# Patient Record
Sex: Female | Born: 1945 | Race: White | Hispanic: No | Marital: Single | Smoking: Never smoker
Health system: Southern US, Community
[De-identification: ages and names within clinical notes are randomized; demographics above are authoritative.]

## PROBLEM LIST (undated history)

## (undated) DIAGNOSIS — Z87440 Personal history of urinary (tract) infections: Secondary | ICD-10-CM

## (undated) DIAGNOSIS — E109 Type 1 diabetes mellitus without complications: Secondary | ICD-10-CM

---

## 1995-04-19 DIAGNOSIS — C50919 Malignant neoplasm of unspecified site of unspecified female breast: Secondary | ICD-10-CM

## 1995-04-19 HISTORY — DX: Malignant neoplasm of unspecified site of unspecified female breast: C50.919

## 2020-09-28 ENCOUNTER — Encounter: Payer: Self-pay | Admitting: Endocrinology

## 2020-09-29 ENCOUNTER — Other Ambulatory Visit: Payer: Self-pay

## 2020-09-29 ENCOUNTER — Emergency Department (HOSPITAL_COMMUNITY)
Admission: EM | Admit: 2020-09-29 | Discharge: 2020-09-29 | Disposition: A | Discharge status: Skilled Nursing Facility | Payer: Medicare HMO | Attending: Emergency Medicine | Admitting: Emergency Medicine

## 2020-09-29 DIAGNOSIS — E11649 Type 2 diabetes mellitus with hypoglycemia without coma: Secondary | ICD-10-CM | POA: Insufficient documentation

## 2020-09-29 DIAGNOSIS — I2510 Atherosclerotic heart disease of native coronary artery without angina pectoris: Secondary | ICD-10-CM | POA: Diagnosis not present

## 2020-09-29 DIAGNOSIS — R600 Localized edema: Secondary | ICD-10-CM | POA: Insufficient documentation

## 2020-09-29 DIAGNOSIS — E162 Hypoglycemia, unspecified: Secondary | ICD-10-CM | POA: Diagnosis present

## 2020-09-29 DIAGNOSIS — Z794 Long term (current) use of insulin: Secondary | ICD-10-CM | POA: Diagnosis not present

## 2020-09-29 DIAGNOSIS — I10 Essential (primary) hypertension: Secondary | ICD-10-CM | POA: Insufficient documentation

## 2020-09-29 LAB — CBC WITH DIFFERENTIAL/PLATELET
Abs Immature Granulocytes: 0.06 10*3/uL (ref 0.00–0.07)
Basophils Absolute: 0.1 10*3/uL (ref 0.0–0.1)
Basophils Relative: 1 %
Eosinophils Absolute: 0 10*3/uL (ref 0.0–0.5)
Eosinophils Relative: 0 %
HCT: 37.8 % (ref 36.0–46.0)
Hemoglobin: 11.8 g/dL — ABNORMAL LOW (ref 12.0–15.0)
Immature Granulocytes: 1 %
Lymphocytes Relative: 6 %
Lymphs Abs: 0.7 10*3/uL (ref 0.7–4.0)
MCH: 29.4 pg (ref 26.0–34.0)
MCHC: 31.2 g/dL (ref 30.0–36.0)
MCV: 94.3 fL (ref 80.0–100.0)
Monocytes Absolute: 0.8 10*3/uL (ref 0.1–1.0)
Monocytes Relative: 8 %
Neutro Abs: 9.3 10*3/uL — ABNORMAL HIGH (ref 1.7–7.7)
Neutrophils Relative %: 84 %
Platelets: 306 10*3/uL (ref 150–400)
RBC: 4.01 MIL/uL (ref 3.87–5.11)
RDW: 12 % (ref 11.5–15.5)
WBC: 10.9 10*3/uL — ABNORMAL HIGH (ref 4.0–10.5)
nRBC: 0 % (ref 0.0–0.2)

## 2020-09-29 LAB — COMPREHENSIVE METABOLIC PANEL
ALT: 12 U/L (ref 0–44)
AST: 19 U/L (ref 15–41)
Albumin: 3 g/dL — ABNORMAL LOW (ref 3.5–5.0)
Alkaline Phosphatase: 83 U/L (ref 38–126)
Anion gap: 10 (ref 5–15)
BUN: 24 mg/dL — ABNORMAL HIGH (ref 8–23)
CO2: 25 mmol/L (ref 22–32)
Calcium: 8.8 mg/dL — ABNORMAL LOW (ref 8.9–10.3)
Chloride: 96 mmol/L — ABNORMAL LOW (ref 98–111)
Creatinine, Ser: 1.15 mg/dL — ABNORMAL HIGH (ref 0.44–1.00)
GFR, Estimated: 50 mL/min — ABNORMAL LOW (ref >60)
Glucose, Bld: 601 mg/dL (ref 70–99)
Potassium: 3.9 mmol/L (ref 3.5–5.1)
Sodium: 131 mmol/L — ABNORMAL LOW (ref 135–145)
Total Bilirubin: 0.6 mg/dL (ref 0.3–1.2)
Total Protein: 6 g/dL — ABNORMAL LOW (ref 6.5–8.1)

## 2020-09-29 LAB — CK: Total CK: 156 U/L (ref 38–234)

## 2020-09-29 LAB — BRAIN NATRIURETIC PEPTIDE: B Natriuretic Peptide: 100.8 pg/mL — ABNORMAL HIGH (ref 0.0–100.0)

## 2020-09-29 LAB — CBG MONITORING, ED
Glucose-Capillary: 215 mg/dL — ABNORMAL HIGH (ref 70–99)
Glucose-Capillary: 237 mg/dL — ABNORMAL HIGH (ref 70–99)
Glucose-Capillary: 262 mg/dL — ABNORMAL HIGH (ref 70–99)

## 2020-09-29 MED ORDER — INSULIN ASPART 100 UNIT/ML IJ SOLN: 4.0000 [IU] | INTRAMUSCULAR | Status: DC

## 2020-09-29 MED ORDER — SODIUM CHLORIDE 0.9 % IV BOLUS
500.0000 mL | Freq: Once | INTRAVENOUS | Status: AC
  Administered 2020-09-29: 500 mL via INTRAVENOUS

## 2020-09-29 NOTE — Discharge Instructions (Addendum)
Please follow-up with your endocrinologist as soon as you are able to.  If no sooner you may follow-up on your July 7 appointment.  I would certainly call earlier to see if there is any cancellations.  In the meantime please use between 4-6 U of insulin per meal. Be cautious. Check your blood sugar and err on the side of high rather than low blood sugar.   You may was return to the ER for any new or concerning symptoms.

## 2020-09-29 NOTE — ED Triage Notes (Signed)
Pt from [facility], BIB [facility] after having an issue of hypoglycemia. Pt was found by staff to have a CBG of 50 and was cool, clammy, and only responsive to pain. EMS administered 15 g of dextrose along with carbs and CBG recheck was 200. Pt now Aox4. This is the 3rd time this has happened in a month. All other VSS.

## 2020-09-29 NOTE — ED Notes (Signed)
Pt transport via [facility]

## 2020-09-29 NOTE — ED Notes (Signed)
PA [facility] notified of pts blood sugar of 601

## 2020-09-29 NOTE — ED Provider Notes (Signed)
[facility] EMERGENCY DEPARTMENT Provider Note   CSN: [id number] Arrival date & time: 09/29/20  1812     History Chief Complaint  Patient presents with   Hypoglycemia    Wanda Jones is a 75 y.o. female.  HPI Patient is a 75 year old female with past medical history significant for CAD s/p stent, DM2 on long and short acting insulin, arthritis, hyperlipidemia, hypertension  Patient is presented today from [facility] independent living she is brought in by EMS because after administration of insulin today after lunch she became clammy, sweaty, cold, lightheaded and does not recall what happened afterwards but was told by bystanders that she passed out although she did not strike her head.  EMS found a blood glucose of 50 and administered glucose.  She states she has no pain at all and no symptoms currently apart from feeling somewhat clammy still.  She states that she feels much improved after she was given 15 g of glucose by EMS.  EMS provided no other medications in route.  They state that her blood sugar increased to greater than 200.  They transported her here to the ER for evaluation.  Patient states that apart from having some lower extremity swelling for the past 2 weeks that is symmetric without any significant pain--although that she does have some occasional leg cramps--she has no significant symptoms.  She states that she used to be on a flexible insulin schedule with carb correction however states that she is now on 9 units of short acting NovoLog that she takes with each meal and 16 units of a long-acting insulin every morning.  She states that she follows with an endocrinologist in [place] last saw 3 months ago.  She states that this is the third episode similar to this where her blood sugar has dropped quite low in the past 3 months.  It seems that patient was started on 9 units of insulin per meal 2 months ago after she was instructed to do this after  hospital stay.  Prior to that she been using sliding scale insulin usually using about 5 or 6 units of insulin per meal.  Denies any chest pain, shortness of breath, fevers, cough, congestion, pain anywhere presently or other significant symptoms.    No past medical history on file.  There are no problems to display for this patient.   The histories are not reviewed yet. Please review them in the "History" navigator section and refresh this [place].   OB History   No obstetric history on file.     No family history on file.     Home Medications Prior to Admission medications   Not on File    Allergies    Patient has no allergy information on record.  Review of Systems   Review of Systems  Constitutional:  Positive for diaphoresis and fatigue. Negative for chills and fever.  HENT:  Negative for congestion.   Eyes:  Negative for pain.  Respiratory:  Negative for cough and shortness of breath.   Cardiovascular:  Positive for leg swelling. Negative for chest pain.  Gastrointestinal:  Negative for abdominal pain, diarrhea, nausea and vomiting.  Genitourinary:  Negative for dysuria.  Musculoskeletal:  Negative for myalgias.  Skin:  Negative for rash.  Neurological:  Positive for syncope and light-headedness. Negative for dizziness and headaches.   Physical Exam Updated Vital Signs BP (!) 137/45 (BP Location: Right Arm)   Pulse (!) 101   Temp 98.2 F (36.8 C) (  Oral)   Resp 17   Ht 5\' 3"  (1.6 m)   Wt 59 kg   SpO2 99%   BMI 23.03 kg/m   Physical Exam Vitals and nursing note reviewed.  Constitutional:      General: She is not in acute distress. HENT:     Head: Normocephalic and atraumatic.     Nose: Nose normal.  Eyes:     General: No scleral icterus. Cardiovascular:     Rate and Rhythm: Normal rate and regular rhythm.     Pulses: Normal pulses.     Heart sounds: Normal heart sounds.  Pulmonary:     Effort: Pulmonary effort is normal. No respiratory  distress.     Breath sounds: No wheezing.  Abdominal:     Palpations: Abdomen is soft.     Tenderness: There is no abdominal tenderness.  Musculoskeletal:     Cervical back: Normal range of motion.     Right lower leg: No edema.     Left lower leg: No edema.     Comments: Scant nonpitting bilateral symmetric edema  Skin:    General: Skin is warm and dry.     Capillary Refill: Capillary refill takes less than 2 seconds.  Neurological:     Mental Status: She is alert. Mental status is at baseline.  Psychiatric:        Mood and Affect: Mood normal.        Behavior: Behavior normal.    ED Results / Procedures / Treatments   Labs (all labs ordered are listed, but only abnormal results are displayed) Labs Reviewed  BRAIN NATRIURETIC PEPTIDE - Abnormal; Notable for the following components:      Result Value   B Natriuretic Peptide 100.8 (*)    All other components within normal limits  COMPREHENSIVE METABOLIC PANEL - Abnormal; Notable for the following components:   Sodium 131 (*)    Chloride 96 (*)    Glucose, Bld 601 (*)    BUN 24 (*)    Creatinine, Ser 1.15 (*)    Calcium 8.8 (*)    Total Protein 6.0 (*)    Albumin 3.0 (*)    GFR, Estimated 50 (*)    All other components within normal limits  CBC WITH DIFFERENTIAL/PLATELET - Abnormal; Notable for the following components:   WBC 10.9 (*)    Hemoglobin 11.8 (*)    Neutro Abs 9.3 (*)    All other components within normal limits  CBG MONITORING, ED - Abnormal; Notable for the following components:   Glucose-Capillary 215 (*)    All other components within normal limits  CBG MONITORING, ED - Abnormal; Notable for the following components:   Glucose-Capillary 237 (*)    All other components within normal limits  CBG MONITORING, ED - Abnormal; Notable for the following components:   Glucose-Capillary 262 (*)    All other components within normal limits  CK    EKG None  Radiology No results  found.  Procedures Procedures   Medications Ordered in ED Medications  sodium chloride 0.9 % bolus 500 mL (0 mLs Intravenous Stopped 09/29/20 2244)    ED Course  I have reviewed the triage vital signs and the nursing notes.  Pertinent labs & imaging results that were available during my care of the patient were reviewed by me and considered in my medical decision making (see chart for details).  Patient is a 75 year old female with past medical history significant for diabetes she states that  she has always been somewhat brittle she states that she usually does sliding scale insulin with carb correction that usually equates to approximately 4-6 units of insulin short acting at a time per meal.  She also uses long-acting insulin.  She states that 2 months ago she had her insulin switched to 9 units per meal and since then she has had 3 episodes of hypoglycemia with her blood sugar getting as low as 33 several weeks ago.  Seems that today she had a similar episode where her blood sugar got down to 50.  She actually syncopized was found on the ground by her friend EMS was called blood sugar was found to be 50 dextrose 15 g was given IV and blood sugar rose to 601 here in the ER.  Clinical Course as of 09/29/20 2339  Tue Sep 29, 2020  2006 patient's CBC has resulted.  No significant anemia and leukocytosis is very mild.  Still awaiting results of CMP, BNP and CK. [WF]  2048 Patient CMP resulted now.  Blood sugar is 601.  BUN slightly elevated patient may be somewhat dehydrated likely secondary to elevated blood sugar.  Will provide 500 mL of normal saline.  We will recheck CBG after. [WF]  2128 Recheck CBG is 237.  She will receive 500 mL of normal saline and will recheck CBG anticipate discharge after this.  Ultimately patient will need to decrease her insulin intake.  Suspect that she should benefit from decreasing dose to 5 units per meal.  She will need to follow-up closely with her  endocrinologist.  She is approximately due for a follow-up appointment. [WF]    Clinical Course User Index [WF] Tedd Sias, PA   MDM Rules/Calculators/A&P                          Physical exam is reassuring.  Overall patient is well-appearing.  Borderline tachycardia with heart rate between 95 and 105 on my examinations.  Received 500 mL of normal saline and heart rate improved from 10 5-95.  CMP with appropriate hyponatremia for hyperglycemia present.  BUN somewhat elevated at 24 creatinine at 1.15 which I do not have a comparison limit for however suspect this is approximately normal.  CK within normal limits.  BMP unremarkable.  CBG improved to mid 200s.  No anion gap on CMP.  Doubt DKA.  Patient feels much improved after tolerating p.o. here in the ER.  Will discharge home with decreased insulin to 4-6 units per meal as she was using before.  She will follow-up with her endocrinologist.  He has scheduled appointment in early July.  Return precautions given.  Final Clinical Impression(s) / ED Diagnoses Final diagnoses:  Hypoglycemia   I discussed this case with my attending physician who cosigned this note including patient's presenting symptoms, physical exam, and planned diagnostics and interventions. Attending physician stated agreement with plan or made changes to plan which were implemented.   Rx / DC Orders ED Discharge Orders     None        Tedd Sias, [place] 09/29/20 2342    Dorie Rank, MD 09/30/20 1108

## 2020-09-29 NOTE — ED Notes (Signed)
Pt offered food

## 2020-09-30 NOTE — ED Notes (Signed)
Rn informed facility that pt left walker here

## 2020-11-16 DIAGNOSIS — M6281 Muscle weakness (generalized): Secondary | ICD-10-CM | POA: Diagnosis not present

## 2020-11-16 DIAGNOSIS — N3946 Mixed incontinence: Secondary | ICD-10-CM | POA: Diagnosis not present

## 2020-11-16 DIAGNOSIS — R41841 Cognitive communication deficit: Secondary | ICD-10-CM | POA: Diagnosis not present

## 2020-11-16 DIAGNOSIS — R262 Difficulty in walking, not elsewhere classified: Secondary | ICD-10-CM | POA: Diagnosis not present

## 2020-11-16 DIAGNOSIS — M25562 Pain in left knee: Secondary | ICD-10-CM | POA: Diagnosis not present

## 2020-11-16 DIAGNOSIS — H5410 Blindness, one eye, low vision other eye, unspecified eyes: Secondary | ICD-10-CM | POA: Diagnosis not present

## 2020-11-16 DIAGNOSIS — R488 Other symbolic dysfunctions: Secondary | ICD-10-CM | POA: Diagnosis not present

## 2020-11-16 DIAGNOSIS — Z20828 Contact with and (suspected) exposure to other viral communicable diseases: Secondary | ICD-10-CM | POA: Diagnosis not present

## 2020-11-16 DIAGNOSIS — Z1159 Encounter for screening for other viral diseases: Secondary | ICD-10-CM | POA: Diagnosis not present

## 2020-11-16 DIAGNOSIS — M25561 Pain in right knee: Secondary | ICD-10-CM | POA: Diagnosis not present

## 2020-11-17 DIAGNOSIS — M25562 Pain in left knee: Secondary | ICD-10-CM | POA: Diagnosis not present

## 2020-11-17 DIAGNOSIS — R41841 Cognitive communication deficit: Secondary | ICD-10-CM | POA: Diagnosis not present

## 2020-11-17 DIAGNOSIS — R262 Difficulty in walking, not elsewhere classified: Secondary | ICD-10-CM | POA: Diagnosis not present

## 2020-11-17 DIAGNOSIS — H5410 Blindness, one eye, low vision other eye, unspecified eyes: Secondary | ICD-10-CM | POA: Diagnosis not present

## 2020-11-17 DIAGNOSIS — N3946 Mixed incontinence: Secondary | ICD-10-CM | POA: Diagnosis not present

## 2020-11-17 DIAGNOSIS — M6281 Muscle weakness (generalized): Secondary | ICD-10-CM | POA: Diagnosis not present

## 2020-11-17 DIAGNOSIS — M25561 Pain in right knee: Secondary | ICD-10-CM | POA: Diagnosis not present

## 2020-11-17 DIAGNOSIS — R488 Other symbolic dysfunctions: Secondary | ICD-10-CM | POA: Diagnosis not present

## 2020-11-18 DIAGNOSIS — H5410 Blindness, one eye, low vision other eye, unspecified eyes: Secondary | ICD-10-CM | POA: Diagnosis not present

## 2020-11-18 DIAGNOSIS — R262 Difficulty in walking, not elsewhere classified: Secondary | ICD-10-CM | POA: Diagnosis not present

## 2020-11-18 DIAGNOSIS — R41841 Cognitive communication deficit: Secondary | ICD-10-CM | POA: Diagnosis not present

## 2020-11-18 DIAGNOSIS — M25562 Pain in left knee: Secondary | ICD-10-CM | POA: Diagnosis not present

## 2020-11-18 DIAGNOSIS — R488 Other symbolic dysfunctions: Secondary | ICD-10-CM | POA: Diagnosis not present

## 2020-11-18 DIAGNOSIS — N3946 Mixed incontinence: Secondary | ICD-10-CM | POA: Diagnosis not present

## 2020-11-18 DIAGNOSIS — M6281 Muscle weakness (generalized): Secondary | ICD-10-CM | POA: Diagnosis not present

## 2020-11-18 DIAGNOSIS — M25561 Pain in right knee: Secondary | ICD-10-CM | POA: Diagnosis not present

## 2020-11-19 DIAGNOSIS — H5410 Blindness, one eye, low vision other eye, unspecified eyes: Secondary | ICD-10-CM | POA: Diagnosis not present

## 2020-11-19 DIAGNOSIS — M25561 Pain in right knee: Secondary | ICD-10-CM | POA: Diagnosis not present

## 2020-11-19 DIAGNOSIS — R41841 Cognitive communication deficit: Secondary | ICD-10-CM | POA: Diagnosis not present

## 2020-11-19 DIAGNOSIS — N3946 Mixed incontinence: Secondary | ICD-10-CM | POA: Diagnosis not present

## 2020-11-19 DIAGNOSIS — M25562 Pain in left knee: Secondary | ICD-10-CM | POA: Diagnosis not present

## 2020-11-19 DIAGNOSIS — R488 Other symbolic dysfunctions: Secondary | ICD-10-CM | POA: Diagnosis not present

## 2020-11-19 DIAGNOSIS — R262 Difficulty in walking, not elsewhere classified: Secondary | ICD-10-CM | POA: Diagnosis not present

## 2020-11-19 DIAGNOSIS — M6281 Muscle weakness (generalized): Secondary | ICD-10-CM | POA: Diagnosis not present

## 2020-11-23 DIAGNOSIS — R262 Difficulty in walking, not elsewhere classified: Secondary | ICD-10-CM | POA: Diagnosis not present

## 2020-11-23 DIAGNOSIS — M6281 Muscle weakness (generalized): Secondary | ICD-10-CM | POA: Diagnosis not present

## 2020-11-23 DIAGNOSIS — M25561 Pain in right knee: Secondary | ICD-10-CM | POA: Diagnosis not present

## 2020-11-23 DIAGNOSIS — N3946 Mixed incontinence: Secondary | ICD-10-CM | POA: Diagnosis not present

## 2020-11-23 DIAGNOSIS — M25562 Pain in left knee: Secondary | ICD-10-CM | POA: Diagnosis not present

## 2020-11-23 DIAGNOSIS — R488 Other symbolic dysfunctions: Secondary | ICD-10-CM | POA: Diagnosis not present

## 2020-11-23 DIAGNOSIS — H5410 Blindness, one eye, low vision other eye, unspecified eyes: Secondary | ICD-10-CM | POA: Diagnosis not present

## 2020-11-23 DIAGNOSIS — R41841 Cognitive communication deficit: Secondary | ICD-10-CM | POA: Diagnosis not present

## 2020-11-25 DIAGNOSIS — G4700 Insomnia, unspecified: Secondary | ICD-10-CM | POA: Diagnosis not present

## 2020-11-25 DIAGNOSIS — Z833 Family history of diabetes mellitus: Secondary | ICD-10-CM | POA: Diagnosis not present

## 2020-11-25 DIAGNOSIS — Z794 Long term (current) use of insulin: Secondary | ICD-10-CM | POA: Diagnosis not present

## 2020-11-25 DIAGNOSIS — F324 Major depressive disorder, single episode, in partial remission: Secondary | ICD-10-CM | POA: Diagnosis not present

## 2020-11-25 DIAGNOSIS — N3946 Mixed incontinence: Secondary | ICD-10-CM | POA: Diagnosis not present

## 2020-11-25 DIAGNOSIS — E119 Type 2 diabetes mellitus without complications: Secondary | ICD-10-CM | POA: Diagnosis not present

## 2020-11-25 DIAGNOSIS — R262 Difficulty in walking, not elsewhere classified: Secondary | ICD-10-CM | POA: Diagnosis not present

## 2020-11-25 DIAGNOSIS — F419 Anxiety disorder, unspecified: Secondary | ICD-10-CM | POA: Diagnosis not present

## 2020-11-25 DIAGNOSIS — Z8249 Family history of ischemic heart disease and other diseases of the circulatory system: Secondary | ICD-10-CM | POA: Diagnosis not present

## 2020-11-25 DIAGNOSIS — M25562 Pain in left knee: Secondary | ICD-10-CM | POA: Diagnosis not present

## 2020-11-25 DIAGNOSIS — G309 Alzheimer's disease, unspecified: Secondary | ICD-10-CM | POA: Diagnosis not present

## 2020-11-25 DIAGNOSIS — M6281 Muscle weakness (generalized): Secondary | ICD-10-CM | POA: Diagnosis not present

## 2020-11-25 DIAGNOSIS — R41841 Cognitive communication deficit: Secondary | ICD-10-CM | POA: Diagnosis not present

## 2020-11-25 DIAGNOSIS — E785 Hyperlipidemia, unspecified: Secondary | ICD-10-CM | POA: Diagnosis not present

## 2020-11-25 DIAGNOSIS — M25561 Pain in right knee: Secondary | ICD-10-CM | POA: Diagnosis not present

## 2020-11-25 DIAGNOSIS — E039 Hypothyroidism, unspecified: Secondary | ICD-10-CM | POA: Diagnosis not present

## 2020-11-25 DIAGNOSIS — R488 Other symbolic dysfunctions: Secondary | ICD-10-CM | POA: Diagnosis not present

## 2020-11-25 DIAGNOSIS — N3281 Overactive bladder: Secondary | ICD-10-CM | POA: Diagnosis not present

## 2020-11-25 DIAGNOSIS — H5410 Blindness, one eye, low vision other eye, unspecified eyes: Secondary | ICD-10-CM | POA: Diagnosis not present

## 2020-11-27 DIAGNOSIS — N3946 Mixed incontinence: Secondary | ICD-10-CM | POA: Diagnosis not present

## 2020-11-27 DIAGNOSIS — H5410 Blindness, one eye, low vision other eye, unspecified eyes: Secondary | ICD-10-CM | POA: Diagnosis not present

## 2020-11-27 DIAGNOSIS — M6281 Muscle weakness (generalized): Secondary | ICD-10-CM | POA: Diagnosis not present

## 2020-11-27 DIAGNOSIS — R262 Difficulty in walking, not elsewhere classified: Secondary | ICD-10-CM | POA: Diagnosis not present

## 2020-11-27 DIAGNOSIS — M25561 Pain in right knee: Secondary | ICD-10-CM | POA: Diagnosis not present

## 2020-11-27 DIAGNOSIS — R41841 Cognitive communication deficit: Secondary | ICD-10-CM | POA: Diagnosis not present

## 2020-11-27 DIAGNOSIS — M25562 Pain in left knee: Secondary | ICD-10-CM | POA: Diagnosis not present

## 2020-11-27 DIAGNOSIS — R488 Other symbolic dysfunctions: Secondary | ICD-10-CM | POA: Diagnosis not present

## 2020-11-30 DIAGNOSIS — M6281 Muscle weakness (generalized): Secondary | ICD-10-CM | POA: Diagnosis not present

## 2020-11-30 DIAGNOSIS — N3946 Mixed incontinence: Secondary | ICD-10-CM | POA: Diagnosis not present

## 2020-11-30 DIAGNOSIS — R262 Difficulty in walking, not elsewhere classified: Secondary | ICD-10-CM | POA: Diagnosis not present

## 2020-11-30 DIAGNOSIS — M25561 Pain in right knee: Secondary | ICD-10-CM | POA: Diagnosis not present

## 2020-11-30 DIAGNOSIS — H5410 Blindness, one eye, low vision other eye, unspecified eyes: Secondary | ICD-10-CM | POA: Diagnosis not present

## 2020-11-30 DIAGNOSIS — R41841 Cognitive communication deficit: Secondary | ICD-10-CM | POA: Diagnosis not present

## 2020-11-30 DIAGNOSIS — Z8616 Personal history of COVID-19: Secondary | ICD-10-CM | POA: Diagnosis not present

## 2020-11-30 DIAGNOSIS — R488 Other symbolic dysfunctions: Secondary | ICD-10-CM | POA: Diagnosis not present

## 2020-11-30 DIAGNOSIS — M25562 Pain in left knee: Secondary | ICD-10-CM | POA: Diagnosis not present

## 2020-12-02 DIAGNOSIS — R41841 Cognitive communication deficit: Secondary | ICD-10-CM | POA: Diagnosis not present

## 2020-12-02 DIAGNOSIS — M25562 Pain in left knee: Secondary | ICD-10-CM | POA: Diagnosis not present

## 2020-12-02 DIAGNOSIS — N3946 Mixed incontinence: Secondary | ICD-10-CM | POA: Diagnosis not present

## 2020-12-02 DIAGNOSIS — R488 Other symbolic dysfunctions: Secondary | ICD-10-CM | POA: Diagnosis not present

## 2020-12-02 DIAGNOSIS — R262 Difficulty in walking, not elsewhere classified: Secondary | ICD-10-CM | POA: Diagnosis not present

## 2020-12-02 DIAGNOSIS — M25561 Pain in right knee: Secondary | ICD-10-CM | POA: Diagnosis not present

## 2020-12-02 DIAGNOSIS — H5410 Blindness, one eye, low vision other eye, unspecified eyes: Secondary | ICD-10-CM | POA: Diagnosis not present

## 2020-12-02 DIAGNOSIS — M6281 Muscle weakness (generalized): Secondary | ICD-10-CM | POA: Diagnosis not present

## 2020-12-03 DIAGNOSIS — F0390 Unspecified dementia without behavioral disturbance: Secondary | ICD-10-CM | POA: Diagnosis not present

## 2020-12-03 DIAGNOSIS — H5410 Blindness, one eye, low vision other eye, unspecified eyes: Secondary | ICD-10-CM | POA: Diagnosis not present

## 2020-12-03 DIAGNOSIS — M25561 Pain in right knee: Secondary | ICD-10-CM | POA: Diagnosis not present

## 2020-12-03 DIAGNOSIS — R488 Other symbolic dysfunctions: Secondary | ICD-10-CM | POA: Diagnosis not present

## 2020-12-03 DIAGNOSIS — M6281 Muscle weakness (generalized): Secondary | ICD-10-CM | POA: Diagnosis not present

## 2020-12-03 DIAGNOSIS — N3946 Mixed incontinence: Secondary | ICD-10-CM | POA: Diagnosis not present

## 2020-12-03 DIAGNOSIS — M25562 Pain in left knee: Secondary | ICD-10-CM | POA: Diagnosis not present

## 2020-12-03 DIAGNOSIS — R262 Difficulty in walking, not elsewhere classified: Secondary | ICD-10-CM | POA: Diagnosis not present

## 2020-12-03 DIAGNOSIS — E538 Deficiency of other specified B group vitamins: Secondary | ICD-10-CM | POA: Diagnosis not present

## 2020-12-03 DIAGNOSIS — R41841 Cognitive communication deficit: Secondary | ICD-10-CM | POA: Diagnosis not present

## 2020-12-03 DIAGNOSIS — E782 Mixed hyperlipidemia: Secondary | ICD-10-CM | POA: Diagnosis not present

## 2020-12-04 DIAGNOSIS — N3946 Mixed incontinence: Secondary | ICD-10-CM | POA: Diagnosis not present

## 2020-12-04 DIAGNOSIS — M25561 Pain in right knee: Secondary | ICD-10-CM | POA: Diagnosis not present

## 2020-12-04 DIAGNOSIS — R262 Difficulty in walking, not elsewhere classified: Secondary | ICD-10-CM | POA: Diagnosis not present

## 2020-12-04 DIAGNOSIS — M6281 Muscle weakness (generalized): Secondary | ICD-10-CM | POA: Diagnosis not present

## 2020-12-04 DIAGNOSIS — R488 Other symbolic dysfunctions: Secondary | ICD-10-CM | POA: Diagnosis not present

## 2020-12-04 DIAGNOSIS — M25562 Pain in left knee: Secondary | ICD-10-CM | POA: Diagnosis not present

## 2020-12-04 DIAGNOSIS — R41841 Cognitive communication deficit: Secondary | ICD-10-CM | POA: Diagnosis not present

## 2020-12-04 DIAGNOSIS — H5410 Blindness, one eye, low vision other eye, unspecified eyes: Secondary | ICD-10-CM | POA: Diagnosis not present

## 2020-12-07 DIAGNOSIS — H5410 Blindness, one eye, low vision other eye, unspecified eyes: Secondary | ICD-10-CM | POA: Diagnosis not present

## 2020-12-07 DIAGNOSIS — R488 Other symbolic dysfunctions: Secondary | ICD-10-CM | POA: Diagnosis not present

## 2020-12-07 DIAGNOSIS — N3946 Mixed incontinence: Secondary | ICD-10-CM | POA: Diagnosis not present

## 2020-12-07 DIAGNOSIS — M25562 Pain in left knee: Secondary | ICD-10-CM | POA: Diagnosis not present

## 2020-12-07 DIAGNOSIS — R262 Difficulty in walking, not elsewhere classified: Secondary | ICD-10-CM | POA: Diagnosis not present

## 2020-12-07 DIAGNOSIS — M25561 Pain in right knee: Secondary | ICD-10-CM | POA: Diagnosis not present

## 2020-12-07 DIAGNOSIS — M6281 Muscle weakness (generalized): Secondary | ICD-10-CM | POA: Diagnosis not present

## 2020-12-07 DIAGNOSIS — Z8616 Personal history of COVID-19: Secondary | ICD-10-CM | POA: Diagnosis not present

## 2020-12-07 DIAGNOSIS — R41841 Cognitive communication deficit: Secondary | ICD-10-CM | POA: Diagnosis not present

## 2020-12-08 DIAGNOSIS — R41841 Cognitive communication deficit: Secondary | ICD-10-CM | POA: Diagnosis not present

## 2020-12-08 DIAGNOSIS — M25562 Pain in left knee: Secondary | ICD-10-CM | POA: Diagnosis not present

## 2020-12-08 DIAGNOSIS — R262 Difficulty in walking, not elsewhere classified: Secondary | ICD-10-CM | POA: Diagnosis not present

## 2020-12-08 DIAGNOSIS — H5410 Blindness, one eye, low vision other eye, unspecified eyes: Secondary | ICD-10-CM | POA: Diagnosis not present

## 2020-12-08 DIAGNOSIS — R488 Other symbolic dysfunctions: Secondary | ICD-10-CM | POA: Diagnosis not present

## 2020-12-08 DIAGNOSIS — M25561 Pain in right knee: Secondary | ICD-10-CM | POA: Diagnosis not present

## 2020-12-08 DIAGNOSIS — N3946 Mixed incontinence: Secondary | ICD-10-CM | POA: Diagnosis not present

## 2020-12-08 DIAGNOSIS — M6281 Muscle weakness (generalized): Secondary | ICD-10-CM | POA: Diagnosis not present

## 2020-12-09 DIAGNOSIS — N3946 Mixed incontinence: Secondary | ICD-10-CM | POA: Diagnosis not present

## 2020-12-09 DIAGNOSIS — R262 Difficulty in walking, not elsewhere classified: Secondary | ICD-10-CM | POA: Diagnosis not present

## 2020-12-09 DIAGNOSIS — R41841 Cognitive communication deficit: Secondary | ICD-10-CM | POA: Diagnosis not present

## 2020-12-09 DIAGNOSIS — R488 Other symbolic dysfunctions: Secondary | ICD-10-CM | POA: Diagnosis not present

## 2020-12-09 DIAGNOSIS — M25561 Pain in right knee: Secondary | ICD-10-CM | POA: Diagnosis not present

## 2020-12-09 DIAGNOSIS — M6281 Muscle weakness (generalized): Secondary | ICD-10-CM | POA: Diagnosis not present

## 2020-12-09 DIAGNOSIS — H5410 Blindness, one eye, low vision other eye, unspecified eyes: Secondary | ICD-10-CM | POA: Diagnosis not present

## 2020-12-09 DIAGNOSIS — M25562 Pain in left knee: Secondary | ICD-10-CM | POA: Diagnosis not present

## 2020-12-10 DIAGNOSIS — N3946 Mixed incontinence: Secondary | ICD-10-CM | POA: Diagnosis not present

## 2020-12-10 DIAGNOSIS — M25562 Pain in left knee: Secondary | ICD-10-CM | POA: Diagnosis not present

## 2020-12-10 DIAGNOSIS — R488 Other symbolic dysfunctions: Secondary | ICD-10-CM | POA: Diagnosis not present

## 2020-12-10 DIAGNOSIS — H5410 Blindness, one eye, low vision other eye, unspecified eyes: Secondary | ICD-10-CM | POA: Diagnosis not present

## 2020-12-10 DIAGNOSIS — R41841 Cognitive communication deficit: Secondary | ICD-10-CM | POA: Diagnosis not present

## 2020-12-10 DIAGNOSIS — M6281 Muscle weakness (generalized): Secondary | ICD-10-CM | POA: Diagnosis not present

## 2020-12-10 DIAGNOSIS — M25561 Pain in right knee: Secondary | ICD-10-CM | POA: Diagnosis not present

## 2020-12-10 DIAGNOSIS — R262 Difficulty in walking, not elsewhere classified: Secondary | ICD-10-CM | POA: Diagnosis not present

## 2020-12-14 DIAGNOSIS — N3946 Mixed incontinence: Secondary | ICD-10-CM | POA: Diagnosis not present

## 2020-12-14 DIAGNOSIS — H5410 Blindness, one eye, low vision other eye, unspecified eyes: Secondary | ICD-10-CM | POA: Diagnosis not present

## 2020-12-14 DIAGNOSIS — M25561 Pain in right knee: Secondary | ICD-10-CM | POA: Diagnosis not present

## 2020-12-14 DIAGNOSIS — R488 Other symbolic dysfunctions: Secondary | ICD-10-CM | POA: Diagnosis not present

## 2020-12-14 DIAGNOSIS — M25562 Pain in left knee: Secondary | ICD-10-CM | POA: Diagnosis not present

## 2020-12-14 DIAGNOSIS — Z8616 Personal history of COVID-19: Secondary | ICD-10-CM | POA: Diagnosis not present

## 2020-12-14 DIAGNOSIS — M6281 Muscle weakness (generalized): Secondary | ICD-10-CM | POA: Diagnosis not present

## 2020-12-14 DIAGNOSIS — R262 Difficulty in walking, not elsewhere classified: Secondary | ICD-10-CM | POA: Diagnosis not present

## 2020-12-14 DIAGNOSIS — R41841 Cognitive communication deficit: Secondary | ICD-10-CM | POA: Diagnosis not present

## 2020-12-15 DIAGNOSIS — F633 Trichotillomania: Secondary | ICD-10-CM | POA: Diagnosis not present

## 2020-12-16 DIAGNOSIS — M25561 Pain in right knee: Secondary | ICD-10-CM | POA: Diagnosis not present

## 2020-12-16 DIAGNOSIS — M25562 Pain in left knee: Secondary | ICD-10-CM | POA: Diagnosis not present

## 2020-12-16 DIAGNOSIS — R488 Other symbolic dysfunctions: Secondary | ICD-10-CM | POA: Diagnosis not present

## 2020-12-16 DIAGNOSIS — H5410 Blindness, one eye, low vision other eye, unspecified eyes: Secondary | ICD-10-CM | POA: Diagnosis not present

## 2020-12-16 DIAGNOSIS — M6281 Muscle weakness (generalized): Secondary | ICD-10-CM | POA: Diagnosis not present

## 2020-12-16 DIAGNOSIS — N3946 Mixed incontinence: Secondary | ICD-10-CM | POA: Diagnosis not present

## 2020-12-16 DIAGNOSIS — R262 Difficulty in walking, not elsewhere classified: Secondary | ICD-10-CM | POA: Diagnosis not present

## 2020-12-17 DIAGNOSIS — R41841 Cognitive communication deficit: Secondary | ICD-10-CM | POA: Diagnosis not present

## 2020-12-17 DIAGNOSIS — N3946 Mixed incontinence: Secondary | ICD-10-CM | POA: Diagnosis not present

## 2020-12-17 DIAGNOSIS — R488 Other symbolic dysfunctions: Secondary | ICD-10-CM | POA: Diagnosis not present

## 2020-12-17 DIAGNOSIS — H5410 Blindness, one eye, low vision other eye, unspecified eyes: Secondary | ICD-10-CM | POA: Diagnosis not present

## 2020-12-17 DIAGNOSIS — M6281 Muscle weakness (generalized): Secondary | ICD-10-CM | POA: Diagnosis not present

## 2020-12-17 DIAGNOSIS — R2681 Unsteadiness on feet: Secondary | ICD-10-CM | POA: Diagnosis not present

## 2020-12-17 DIAGNOSIS — R278 Other lack of coordination: Secondary | ICD-10-CM | POA: Diagnosis not present

## 2020-12-18 DIAGNOSIS — R41841 Cognitive communication deficit: Secondary | ICD-10-CM | POA: Diagnosis not present

## 2020-12-18 DIAGNOSIS — M6281 Muscle weakness (generalized): Secondary | ICD-10-CM | POA: Diagnosis not present

## 2020-12-18 DIAGNOSIS — R2681 Unsteadiness on feet: Secondary | ICD-10-CM | POA: Diagnosis not present

## 2020-12-18 DIAGNOSIS — R488 Other symbolic dysfunctions: Secondary | ICD-10-CM | POA: Diagnosis not present

## 2020-12-18 DIAGNOSIS — N3946 Mixed incontinence: Secondary | ICD-10-CM | POA: Diagnosis not present

## 2020-12-18 DIAGNOSIS — R278 Other lack of coordination: Secondary | ICD-10-CM | POA: Diagnosis not present

## 2020-12-18 DIAGNOSIS — H5410 Blindness, one eye, low vision other eye, unspecified eyes: Secondary | ICD-10-CM | POA: Diagnosis not present

## 2020-12-21 DIAGNOSIS — Z20828 Contact with and (suspected) exposure to other viral communicable diseases: Secondary | ICD-10-CM | POA: Diagnosis not present

## 2020-12-23 DIAGNOSIS — N3946 Mixed incontinence: Secondary | ICD-10-CM | POA: Diagnosis not present

## 2020-12-23 DIAGNOSIS — R278 Other lack of coordination: Secondary | ICD-10-CM | POA: Diagnosis not present

## 2020-12-23 DIAGNOSIS — R2681 Unsteadiness on feet: Secondary | ICD-10-CM | POA: Diagnosis not present

## 2020-12-23 DIAGNOSIS — M6281 Muscle weakness (generalized): Secondary | ICD-10-CM | POA: Diagnosis not present

## 2020-12-23 DIAGNOSIS — R41841 Cognitive communication deficit: Secondary | ICD-10-CM | POA: Diagnosis not present

## 2020-12-23 DIAGNOSIS — R488 Other symbolic dysfunctions: Secondary | ICD-10-CM | POA: Diagnosis not present

## 2020-12-23 DIAGNOSIS — H5410 Blindness, one eye, low vision other eye, unspecified eyes: Secondary | ICD-10-CM | POA: Diagnosis not present

## 2020-12-25 DIAGNOSIS — N3946 Mixed incontinence: Secondary | ICD-10-CM | POA: Diagnosis not present

## 2020-12-25 DIAGNOSIS — R278 Other lack of coordination: Secondary | ICD-10-CM | POA: Diagnosis not present

## 2020-12-25 DIAGNOSIS — R488 Other symbolic dysfunctions: Secondary | ICD-10-CM | POA: Diagnosis not present

## 2020-12-25 DIAGNOSIS — M6281 Muscle weakness (generalized): Secondary | ICD-10-CM | POA: Diagnosis not present

## 2020-12-25 DIAGNOSIS — R2681 Unsteadiness on feet: Secondary | ICD-10-CM | POA: Diagnosis not present

## 2020-12-25 DIAGNOSIS — H5410 Blindness, one eye, low vision other eye, unspecified eyes: Secondary | ICD-10-CM | POA: Diagnosis not present

## 2020-12-25 DIAGNOSIS — R41841 Cognitive communication deficit: Secondary | ICD-10-CM | POA: Diagnosis not present

## 2020-12-28 DIAGNOSIS — R2681 Unsteadiness on feet: Secondary | ICD-10-CM | POA: Diagnosis not present

## 2020-12-28 DIAGNOSIS — N3946 Mixed incontinence: Secondary | ICD-10-CM | POA: Diagnosis not present

## 2020-12-28 DIAGNOSIS — R41841 Cognitive communication deficit: Secondary | ICD-10-CM | POA: Diagnosis not present

## 2020-12-28 DIAGNOSIS — R488 Other symbolic dysfunctions: Secondary | ICD-10-CM | POA: Diagnosis not present

## 2020-12-28 DIAGNOSIS — M6281 Muscle weakness (generalized): Secondary | ICD-10-CM | POA: Diagnosis not present

## 2020-12-28 DIAGNOSIS — H5410 Blindness, one eye, low vision other eye, unspecified eyes: Secondary | ICD-10-CM | POA: Diagnosis not present

## 2020-12-28 DIAGNOSIS — Z8616 Personal history of COVID-19: Secondary | ICD-10-CM | POA: Diagnosis not present

## 2020-12-28 DIAGNOSIS — R278 Other lack of coordination: Secondary | ICD-10-CM | POA: Diagnosis not present

## 2020-12-29 DIAGNOSIS — R488 Other symbolic dysfunctions: Secondary | ICD-10-CM | POA: Diagnosis not present

## 2020-12-29 DIAGNOSIS — R41841 Cognitive communication deficit: Secondary | ICD-10-CM | POA: Diagnosis not present

## 2020-12-29 DIAGNOSIS — R2681 Unsteadiness on feet: Secondary | ICD-10-CM | POA: Diagnosis not present

## 2020-12-29 DIAGNOSIS — R278 Other lack of coordination: Secondary | ICD-10-CM | POA: Diagnosis not present

## 2020-12-29 DIAGNOSIS — H5410 Blindness, one eye, low vision other eye, unspecified eyes: Secondary | ICD-10-CM | POA: Diagnosis not present

## 2020-12-29 DIAGNOSIS — M6281 Muscle weakness (generalized): Secondary | ICD-10-CM | POA: Diagnosis not present

## 2020-12-29 DIAGNOSIS — N3946 Mixed incontinence: Secondary | ICD-10-CM | POA: Diagnosis not present

## 2020-12-31 DIAGNOSIS — N3946 Mixed incontinence: Secondary | ICD-10-CM | POA: Diagnosis not present

## 2020-12-31 DIAGNOSIS — R278 Other lack of coordination: Secondary | ICD-10-CM | POA: Diagnosis not present

## 2020-12-31 DIAGNOSIS — R488 Other symbolic dysfunctions: Secondary | ICD-10-CM | POA: Diagnosis not present

## 2020-12-31 DIAGNOSIS — R2681 Unsteadiness on feet: Secondary | ICD-10-CM | POA: Diagnosis not present

## 2020-12-31 DIAGNOSIS — H5410 Blindness, one eye, low vision other eye, unspecified eyes: Secondary | ICD-10-CM | POA: Diagnosis not present

## 2020-12-31 DIAGNOSIS — F0390 Unspecified dementia without behavioral disturbance: Secondary | ICD-10-CM | POA: Diagnosis not present

## 2020-12-31 DIAGNOSIS — F3341 Major depressive disorder, recurrent, in partial remission: Secondary | ICD-10-CM | POA: Diagnosis not present

## 2020-12-31 DIAGNOSIS — E039 Hypothyroidism, unspecified: Secondary | ICD-10-CM | POA: Diagnosis not present

## 2020-12-31 DIAGNOSIS — R41841 Cognitive communication deficit: Secondary | ICD-10-CM | POA: Diagnosis not present

## 2020-12-31 DIAGNOSIS — E782 Mixed hyperlipidemia: Secondary | ICD-10-CM | POA: Diagnosis not present

## 2020-12-31 DIAGNOSIS — M6281 Muscle weakness (generalized): Secondary | ICD-10-CM | POA: Diagnosis not present

## 2021-01-02 ENCOUNTER — Encounter (HOSPITAL_COMMUNITY): Payer: Self-pay | Admitting: *Deleted

## 2021-01-02 ENCOUNTER — Other Ambulatory Visit: Payer: Self-pay

## 2021-01-02 ENCOUNTER — Inpatient Hospital Stay (HOSPITAL_COMMUNITY)
Admission: EM | Admit: 2021-01-02 | Discharge: 2021-01-06 | DRG: 872 | Disposition: A | Discharge status: Home / Self Care | Payer: Medicare HMO | Attending: Internal Medicine | Admitting: Internal Medicine

## 2021-01-02 DIAGNOSIS — L03116 Cellulitis of left lower limb: Secondary | ICD-10-CM | POA: Diagnosis present

## 2021-01-02 DIAGNOSIS — N179 Acute kidney failure, unspecified: Secondary | ICD-10-CM

## 2021-01-02 DIAGNOSIS — E1065 Type 1 diabetes mellitus with hyperglycemia: Secondary | ICD-10-CM | POA: Diagnosis not present

## 2021-01-02 DIAGNOSIS — L03119 Cellulitis of unspecified part of limb: Secondary | ICD-10-CM | POA: Diagnosis not present

## 2021-01-02 DIAGNOSIS — A419 Sepsis, unspecified organism: Secondary | ICD-10-CM | POA: Diagnosis not present

## 2021-01-02 DIAGNOSIS — I2510 Atherosclerotic heart disease of native coronary artery without angina pectoris: Secondary | ICD-10-CM | POA: Diagnosis present

## 2021-01-02 DIAGNOSIS — R739 Hyperglycemia, unspecified: Secondary | ICD-10-CM | POA: Diagnosis not present

## 2021-01-02 DIAGNOSIS — Z8249 Family history of ischemic heart disease and other diseases of the circulatory system: Secondary | ICD-10-CM

## 2021-01-02 DIAGNOSIS — E1165 Type 2 diabetes mellitus with hyperglycemia: Secondary | ICD-10-CM | POA: Diagnosis not present

## 2021-01-02 DIAGNOSIS — Z85820 Personal history of malignant melanoma of skin: Secondary | ICD-10-CM | POA: Diagnosis not present

## 2021-01-02 DIAGNOSIS — N3281 Overactive bladder: Secondary | ICD-10-CM | POA: Diagnosis present

## 2021-01-02 DIAGNOSIS — Z853 Personal history of malignant neoplasm of breast: Secondary | ICD-10-CM

## 2021-01-02 DIAGNOSIS — R000 Tachycardia, unspecified: Secondary | ICD-10-CM | POA: Diagnosis not present

## 2021-01-02 DIAGNOSIS — Z885 Allergy status to narcotic agent status: Secondary | ICD-10-CM

## 2021-01-02 DIAGNOSIS — E039 Hypothyroidism, unspecified: Secondary | ICD-10-CM | POA: Diagnosis not present

## 2021-01-02 DIAGNOSIS — L0390 Cellulitis, unspecified: Secondary | ICD-10-CM

## 2021-01-02 DIAGNOSIS — E860 Dehydration: Secondary | ICD-10-CM | POA: Diagnosis present

## 2021-01-02 DIAGNOSIS — Z888 Allergy status to other drugs, medicaments and biological substances status: Secondary | ICD-10-CM

## 2021-01-02 DIAGNOSIS — Z20822 Contact with and (suspected) exposure to covid-19: Secondary | ICD-10-CM | POA: Diagnosis not present

## 2021-01-02 DIAGNOSIS — Z794 Long term (current) use of insulin: Secondary | ICD-10-CM | POA: Diagnosis not present

## 2021-01-02 DIAGNOSIS — Z79899 Other long term (current) drug therapy: Secondary | ICD-10-CM | POA: Diagnosis not present

## 2021-01-02 DIAGNOSIS — A4189 Other specified sepsis: Secondary | ICD-10-CM | POA: Diagnosis present

## 2021-01-02 DIAGNOSIS — L03115 Cellulitis of right lower limb: Secondary | ICD-10-CM | POA: Diagnosis present

## 2021-01-02 DIAGNOSIS — N390 Urinary tract infection, site not specified: Secondary | ICD-10-CM

## 2021-01-02 DIAGNOSIS — E785 Hyperlipidemia, unspecified: Secondary | ICD-10-CM | POA: Diagnosis present

## 2021-01-02 DIAGNOSIS — E872 Acidosis: Secondary | ICD-10-CM | POA: Diagnosis not present

## 2021-01-02 DIAGNOSIS — Z801 Family history of malignant neoplasm of trachea, bronchus and lung: Secondary | ICD-10-CM

## 2021-01-02 DIAGNOSIS — F0390 Unspecified dementia without behavioral disturbance: Secondary | ICD-10-CM | POA: Diagnosis not present

## 2021-01-02 DIAGNOSIS — I10 Essential (primary) hypertension: Secondary | ICD-10-CM | POA: Diagnosis present

## 2021-01-02 DIAGNOSIS — A4151 Sepsis due to Escherichia coli [E. coli]: Principal | ICD-10-CM | POA: Diagnosis present

## 2021-01-02 DIAGNOSIS — E876 Hypokalemia: Secondary | ICD-10-CM | POA: Diagnosis present

## 2021-01-02 DIAGNOSIS — B964 Proteus (mirabilis) (morganii) as the cause of diseases classified elsewhere: Secondary | ICD-10-CM | POA: Diagnosis present

## 2021-01-02 DIAGNOSIS — F32A Depression, unspecified: Secondary | ICD-10-CM | POA: Diagnosis present

## 2021-01-02 DIAGNOSIS — E119 Type 2 diabetes mellitus without complications: Secondary | ICD-10-CM

## 2021-01-02 DIAGNOSIS — E109 Type 1 diabetes mellitus without complications: Secondary | ICD-10-CM

## 2021-01-02 DIAGNOSIS — I959 Hypotension, unspecified: Secondary | ICD-10-CM | POA: Diagnosis not present

## 2021-01-02 DIAGNOSIS — R531 Weakness: Secondary | ICD-10-CM | POA: Diagnosis not present

## 2021-01-02 HISTORY — DX: Hyperglycemia, unspecified: R73.9

## 2021-01-02 HISTORY — DX: Cellulitis, unspecified: L03.90

## 2021-01-02 HISTORY — DX: Urinary tract infection, site not specified: N39.0

## 2021-01-02 HISTORY — DX: Sepsis, unspecified organism: A41.9

## 2021-01-02 LAB — CBC
HCT: 37.3 % (ref 36.0–46.0)
Hemoglobin: 12 g/dL (ref 12.0–15.0)
MCH: 28.7 pg (ref 26.0–34.0)
MCHC: 32.2 g/dL (ref 30.0–36.0)
MCV: 89.2 fL (ref 80.0–100.0)
Platelets: 269 10*3/uL (ref 150–400)
RBC: 4.18 MIL/uL (ref 3.87–5.11)
RDW: 12.5 % (ref 11.5–15.5)
WBC: 13.1 10*3/uL — ABNORMAL HIGH (ref 4.0–10.5)
nRBC: 0 % (ref 0.0–0.2)

## 2021-01-02 LAB — RESP PANEL BY RT-PCR (FLU A&B, COVID) ARPGX2
Influenza A by PCR: NEGATIVE
Influenza B by PCR: NEGATIVE
SARS Coronavirus 2 by RT PCR: NEGATIVE

## 2021-01-02 LAB — HEPATIC FUNCTION PANEL
ALT: 16 U/L (ref 0–44)
AST: 30 U/L (ref 15–41)
Albumin: 3.3 g/dL — ABNORMAL LOW (ref 3.5–5.0)
Alkaline Phosphatase: 91 U/L (ref 38–126)
Bilirubin, Direct: 0.4 mg/dL — ABNORMAL HIGH (ref 0.0–0.2)
Indirect Bilirubin: 0.6 mg/dL (ref 0.3–0.9)
Total Bilirubin: 1 mg/dL (ref 0.3–1.2)
Total Protein: 7.3 g/dL (ref 6.5–8.1)

## 2021-01-02 LAB — BASIC METABOLIC PANEL
Anion gap: 12 (ref 5–15)
BUN: 26 mg/dL — ABNORMAL HIGH (ref 8–23)
CO2: 24 mmol/L (ref 22–32)
Calcium: 8.6 mg/dL — ABNORMAL LOW (ref 8.9–10.3)
Chloride: 99 mmol/L (ref 98–111)
Creatinine, Ser: 1.27 mg/dL — ABNORMAL HIGH (ref 0.44–1.00)
GFR, Estimated: 44 mL/min — ABNORMAL LOW (ref >60)
Glucose, Bld: 438 mg/dL — ABNORMAL HIGH (ref 70–99)
Potassium: 4.5 mmol/L (ref 3.5–5.1)
Sodium: 135 mmol/L (ref 135–145)

## 2021-01-02 LAB — CBG MONITORING, ED
Glucose-Capillary: 199 mg/dL — ABNORMAL HIGH (ref 70–99)
Glucose-Capillary: 249 mg/dL — ABNORMAL HIGH (ref 70–99)
Glucose-Capillary: 465 mg/dL — ABNORMAL HIGH (ref 70–99)

## 2021-01-02 LAB — URINALYSIS, ROUTINE W REFLEX MICROSCOPIC
Bilirubin Urine: NEGATIVE
Glucose, UA: >=500 mg/dL — AB
Ketones, ur: NEGATIVE mg/dL
Nitrite: NEGATIVE
Protein, ur: NEGATIVE mg/dL
Specific Gravity, Urine: 1.008 (ref 1.005–1.030)
pH: 9 — ABNORMAL HIGH (ref 5.0–8.0)

## 2021-01-02 LAB — LACTIC ACID, PLASMA
Lactic Acid, Venous: 2 mmol/L (ref 0.5–1.9)
Lactic Acid, Venous: 0.9 mmol/L (ref 0.5–1.9)

## 2021-01-02 LAB — LIPASE, BLOOD: Lipase: 52 U/L — ABNORMAL HIGH (ref 11–51)

## 2021-01-02 MED ORDER — INSULIN ASPART 100 UNIT/ML IJ SOLN: 5.0000 [IU] | Freq: Three times a day (TID) | INTRAMUSCULAR | Status: DC

## 2021-01-02 MED ORDER — LACTATED RINGERS IV BOLUS
1000.0000 mL | Freq: Once | INTRAVENOUS | Status: AC
  Administered 2021-01-02: 1000 mL via INTRAVENOUS

## 2021-01-02 MED ORDER — MEMANTINE HCL 5 MG PO TABS
5.0000 mg | ORAL_TABLET | Freq: Two times a day (BID) | ORAL | Status: DC
  Administered 2021-01-03 – 2021-01-06 (×7): 5 mg via ORAL
  Filled 2021-01-02 (×10): qty 1

## 2021-01-02 MED ORDER — ACETAMINOPHEN 650 MG RE SUPP: 650.0000 mg | Freq: Four times a day (QID) | RECTAL | Status: DC | PRN

## 2021-01-02 MED ORDER — SODIUM CHLORIDE 0.9 % IV SOLN: INTRAVENOUS | Status: AC

## 2021-01-02 MED ORDER — MIRABEGRON ER 25 MG PO TB24
50.0000 mg | ORAL_TABLET | Freq: Every day | ORAL | Status: DC
  Administered 2021-01-04 – 2021-01-06 (×3): 50 mg via ORAL
  Filled 2021-01-02: qty 2
  Filled 2021-01-02: qty 1
  Filled 2021-01-02 (×2): qty 2

## 2021-01-02 MED ORDER — ACETAMINOPHEN 500 MG PO TABS
1000.0000 mg | ORAL_TABLET | Freq: Once | ORAL | Status: AC
  Administered 2021-01-02: 1000 mg via ORAL
  Filled 2021-01-02: qty 2

## 2021-01-02 MED ORDER — DONEPEZIL HCL 10 MG PO TABS
5.0000 mg | ORAL_TABLET | Freq: Every day | ORAL | Status: DC
  Administered 2021-01-03 – 2021-01-05 (×4): 5 mg via ORAL
  Filled 2021-01-02 (×5): qty 1

## 2021-01-02 MED ORDER — ACETAMINOPHEN 325 MG PO TABS
650.0000 mg | ORAL_TABLET | Freq: Four times a day (QID) | ORAL | Status: DC | PRN
  Administered 2021-01-03: 650 mg via ORAL
  Filled 2021-01-02: qty 2

## 2021-01-02 MED ORDER — VENLAFAXINE HCL ER 75 MG PO CP24
150.0000 mg | ORAL_CAPSULE | Freq: Every day | ORAL | Status: DC
  Administered 2021-01-04 – 2021-01-06 (×3): 150 mg via ORAL
  Filled 2021-01-02 (×2): qty 2
  Filled 2021-01-02: qty 1
  Filled 2021-01-02: qty 2
  Filled 2021-01-02: qty 1

## 2021-01-02 MED ORDER — INSULIN GLARGINE-YFGN 100 UNIT/ML ~~LOC~~ SOLN
20.0000 [IU] | Freq: Every day | SUBCUTANEOUS | Status: DC
  Administered 2021-01-03 – 2021-01-06 (×4): 20 [IU] via SUBCUTANEOUS
  Filled 2021-01-02 (×5): qty 0.2

## 2021-01-02 MED ORDER — ENOXAPARIN SODIUM 40 MG/0.4ML IJ SOSY
40.0000 mg | PREFILLED_SYRINGE | Freq: Every day | INTRAMUSCULAR | Status: DC
  Administered 2021-01-03 – 2021-01-05 (×4): 40 mg via SUBCUTANEOUS
  Filled 2021-01-02 (×4): qty 0.4

## 2021-01-02 MED ORDER — CEFTRIAXONE SODIUM 2 G IJ SOLR
2.0000 g | INTRAMUSCULAR | Status: DC
  Administered 2021-01-03 – 2021-01-05 (×3): 2 g via INTRAVENOUS
  Filled 2021-01-02 (×3): qty 20

## 2021-01-02 MED ORDER — INSULIN ASPART 100 UNIT/ML IJ SOLN: 0.0000 [IU] | Freq: Every day | INTRAMUSCULAR | Status: DC

## 2021-01-02 MED ORDER — ROSUVASTATIN CALCIUM 5 MG PO TABS
5.0000 mg | ORAL_TABLET | Freq: Every day | ORAL | Status: DC
  Administered 2021-01-03 – 2021-01-05 (×4): 5 mg via ORAL
  Filled 2021-01-02 (×4): qty 1

## 2021-01-02 MED ORDER — SODIUM CHLORIDE 0.9 % IV SOLN
2.0000 g | Freq: Once | INTRAVENOUS | Status: AC
  Administered 2021-01-02: 2 g via INTRAVENOUS
  Filled 2021-01-02: qty 20

## 2021-01-02 MED ORDER — INSULIN ASPART 100 UNIT/ML IJ SOLN
0.0000 [IU] | Freq: Three times a day (TID) | INTRAMUSCULAR | Status: DC
  Administered 2021-01-03: 3 [IU] via SUBCUTANEOUS

## 2021-01-02 MED ORDER — LACTATED RINGERS IV BOLUS: 1800.0000 mL | Freq: Once | INTRAVENOUS | Status: DC

## 2021-01-02 MED ORDER — LEVOTHYROXINE SODIUM 88 MCG PO TABS
88.0000 ug | ORAL_TABLET | Freq: Every day | ORAL | Status: DC
  Administered 2021-01-03 – 2021-01-06 (×4): 88 ug via ORAL
  Filled 2021-01-02 (×6): qty 1

## 2021-01-02 MED ORDER — INSULIN DEGLUDEC 100 UNIT/ML ~~LOC~~ SOPN: 20.0000 [IU] | PEN_INJECTOR | Freq: Every day | SUBCUTANEOUS | Status: DC

## 2021-01-02 NOTE — ED Notes (Signed)
Provider at bedside

## 2021-01-02 NOTE — ED Notes (Signed)
Son arrived and is at bedside.

## 2021-01-02 NOTE — ED Provider Notes (Signed)
Emergency Medicine Provider Triage Evaluation Note  Wanda Jones , a 75 y.o. female  was evaluated in triage.  Pt complains of hyperglycemia.  Checked blood sugar at home and it is around 450. Patient having back pain, some nausea, abdominal pain, increased frequency of urination. She has DM type 1. No recent history of infection. Recommend fluids and possible DKA protocol depending on labs.   Review of Systems  Positive: Back pain, nausea, abdominal pain, increased frequency of urination Negative: Vision changes, chest pain, shortness of breath  Physical Exam  BP (!) 158/81 (BP Location: Left Arm)   Pulse (!) 104   Temp 98.7 F (37.1 C) (Oral)   Resp 16   Ht '5\' 3"'$  (1.6 m)   Wt 59 kg   SpO2 100%   BMI 23.04 kg/m  Gen:   Awake, no distress Resp:  Normal effort MSK:   Moves extremities without difficulty Other:  Lungs CTA. Mucous membranes dry. Generalized abdominal tenderness.   Medical Decision Making  Medically screening exam initiated at 4:50 PM.  Appropriate orders placed.  Raya Armas was informed that the remainder of the evaluation will be completed by another provider, this initial triage assessment does not replace that evaluation, and the importance of remaining in the ED until their evaluation is complete.   Adolphus Birchwood, PA-C [id number]    Lianne Cure, DO [id number] [id number]

## 2021-01-02 NOTE — ED Triage Notes (Signed)
The pt arrived by [facility]  from [facility] feeling tired and lethargic since yesterday diabetic  glucose is high  since 1200n she took some insulin around then  when ems arrived cbg read high iv and one liter  of nss  brought the cbg down to 580 on arrival to the ed

## 2021-01-02 NOTE — ED Provider Notes (Signed)
[facility] EMERGENCY DEPARTMENT Provider Note   CSN: [id number] Arrival date & time: 01/02/21  1637     History Chief Complaint  Patient presents with   Hyperglycemia    Wanda Jones is a 75 y.o. female with PMHx T1DM who presents for evaluation of hyperglycemia.  Patient reports an approximately 1 week history of generalized malaise and hyperglycemia.  She states that her glucose has been reading in the 300s and 400s.  Over the past day, the patient has developed left flank pain, dysuria, and rigors.  She has been nauseous with decreased p.o. intake over this time too.  He subsequently presented to our emergency department for further evaluation.    History reviewed. No pertinent past medical history.  Patient Active Problem List   Diagnosis Date Noted   Sepsis ([place]) 01/02/2021   UTI (urinary tract infection) 01/02/2021   Cellulitis 01/02/2021   Hyperglycemia 01/02/2021   AKI (acute kidney injury) ([place]) 01/02/2021   Type 1 diabetes ([place]) 01/02/2021    History reviewed. No pertinent surgical history.   OB History   No obstetric history on file.     No family history on file.     Home Medications Prior to Admission medications   Medication Sig Start Date End Date Taking? Authorizing Provider  acetaminophen (TYLENOL) 325 MG tablet Take 325 mg by mouth every 6 (six) hours as needed for moderate pain or headache.   Yes [provider]  cetirizine (ZYRTEC) 10 MG tablet Take 10 mg by mouth daily.   Yes [provider]  clobetasol (TEMOVATE) 0.05 % external solution Apply 1 application topically 2 (two) times daily as needed (irritation).   Yes [provider]  donepezil (ARICEPT) 5 MG tablet Take 5 mg by mouth at bedtime.   Yes [provider]  Insulin Aspart (NOVOLOG FLEXPEN [place]) Inject 5 Units into the skin with breakfast, with lunch, and with evening meal.   Yes [provider]  insulin degludec (TRESIBA  FLEXTOUCH) 100 UNIT/ML FlexTouch Pen Inject 16 Units into the skin daily.   Yes [provider]  levothyroxine (SYNTHROID) 88 MCG tablet Take 88 mcg by mouth daily before breakfast.   Yes [provider]  memantine (NAMENDA) 5 MG tablet Take 5 mg by mouth 2 (two) times daily.   Yes [provider]  MYRBETRIQ 50 MG TB24 tablet Take 50 mg by mouth daily. 11/27/20  Yes [provider]  rosuvastatin (CRESTOR) 5 MG tablet Take 5 mg by mouth at bedtime.   Yes [provider]  venlafaxine XR (EFFEXOR-XR) 150 MG 24 hr capsule Take 150 mg by mouth daily with breakfast.   Yes [provider]  vitamin B-12 (CYANOCOBALAMIN) 1000 MCG tablet Take 1,000 mcg by mouth daily.   Yes [provider]    Allergies    Codeine and Prednisone  Review of Systems   Review of Systems  Constitutional:  Positive for chills and fatigue. Negative for fever.  HENT:  Negative for ear pain and sore throat.   Eyes:  Negative for pain and visual disturbance.  Respiratory:  Negative for cough and shortness of breath.   Cardiovascular:  Negative for chest pain and palpitations.  Gastrointestinal:  Negative for abdominal pain and vomiting.  Genitourinary:  Positive for dysuria and flank pain. Negative for hematuria.  Musculoskeletal:  Negative for arthralgias and back pain.  Skin:  Negative for color change and rash.  Neurological:  Negative for seizures and syncope.  Psychiatric/Behavioral:  Positive for confusion.   All other systems reviewed and are negative.  Physical Exam Updated Vital Signs BP (!) 146/61   Pulse (!) 103   Temp 99.3 F (37.4 C) (Oral)   Resp (!) 26   Ht _0  (1.6 m)   Wt 59 kg   SpO2 94%   BMI 23.04 kg/m   Physical Exam Vitals and nursing note reviewed.  Constitutional:      General: She is not in acute distress.    Appearance: She is well-developed.  HENT:     Head: Normocephalic and atraumatic.  Eyes:      Conjunctiva/sclera: Conjunctivae normal.  Cardiovascular:     Rate and Rhythm: Normal rate and regular rhythm.     Heart sounds: No murmur heard. Pulmonary:     Effort: Pulmonary effort is normal. No respiratory distress.     Breath sounds: Normal breath sounds.  Abdominal:     Palpations: Abdomen is soft.     Tenderness: There is no abdominal tenderness.  Musculoskeletal:     Cervical back: Neck supple.  Skin:    General: Skin is warm and dry.  Neurological:     General: No focal deficit present.     Mental Status: She is alert. Mental status is at baseline. She is disoriented.     Cranial Nerves: No cranial nerve deficit.     Sensory: No sensory deficit.     Motor: No weakness.    ED Results / Procedures / Treatments   Labs (all labs ordered are listed, but only abnormal results are displayed) Labs Reviewed  BASIC METABOLIC PANEL - Abnormal; Notable for the following components:      Result Value   Glucose, Bld 438 (*)    BUN 26 (*)    Creatinine, Ser 1.27 (*)    Calcium 8.6 (*)    GFR, Estimated 44 (*)    All other components within normal limits  CBC - Abnormal; Notable for the following components:   WBC 13.1 (*)    All other components within normal limits  URINALYSIS, ROUTINE W REFLEX MICROSCOPIC - Abnormal; Notable for the following components:   Color, Urine STRAW (*)    pH 9.0 (*)    Glucose, UA >=500 (*)    Hgb urine dipstick MODERATE (*)    Leukocytes,Ua TRACE (*)    Bacteria, UA FEW (*)    All other components within normal limits  LIPASE, BLOOD - Abnormal; Notable for the following components:   Lipase 52 (*)    All other components within normal limits  HEPATIC FUNCTION PANEL - Abnormal; Notable for the following components:   Albumin 3.3 (*)    Bilirubin, Direct 0.4 (*)    All other components within normal limits  LACTIC ACID, PLASMA - Abnormal; Notable for the following components:   Lactic Acid, Venous 2.0 (*)    All other components within  normal limits  HEMOGLOBIN A1C - Abnormal; Notable for the following components:   Hgb A1c MFr Bld 9.6 (*)    All other components within normal limits  CBG MONITORING, ED - Abnormal; Notable for the following components:   Glucose-Capillary 465 (*)    All other components within normal limits  CBG MONITORING, ED - Abnormal; Notable for the following components:   Glucose-Capillary 249 (*)    All other components within normal limits  CBG MONITORING, ED - Abnormal; Notable for the following components:   Glucose-Capillary 199 (*)    All other  components within normal limits  RESP PANEL BY RT-PCR (FLU A&B, COVID) ARPGX2  CULTURE, BLOOD (ROUTINE X 2)  CULTURE, BLOOD (ROUTINE X 2)  URINE CULTURE  LACTIC ACID, PLASMA  CBC    EKG None  Radiology No results found.  Procedures Procedures   Medications Ordered in ED Medications  rosuvastatin (CRESTOR) tablet 5 mg (5 mg Oral Given 01/03/21 0016)  donepezil (ARICEPT) tablet 5 mg (5 mg Oral Given 01/03/21 0016)  memantine (NAMENDA) tablet 5 mg (5 mg Oral Given 01/03/21 0016)  venlafaxine XR (EFFEXOR-XR) 24 hr capsule 150 mg (has no administration in time range)  insulin aspart (novoLOG) injection 5 Units (has no administration in time range)  levothyroxine (SYNTHROID) tablet 88 mcg (has no administration in time range)  mirabegron ER (MYRBETRIQ) tablet 50 mg (has no administration in time range)  enoxaparin (LOVENOX) injection 40 mg (40 mg Subcutaneous Given 01/03/21 0016)  acetaminophen (TYLENOL) tablet 650 mg (has no administration in time range)    Or  acetaminophen (TYLENOL) suppository 650 mg (has no administration in time range)  0.9 %  sodium chloride infusion ( Intravenous New Bag/Given 01/03/21 0016)  cefTRIAXone (ROCEPHIN) 2 g in sodium chloride 0.9 % 100 mL IVPB (has no administration in time range)  insulin aspart (novoLOG) injection 0-15 Units (has no administration in time range)  insulin aspart (novoLOG) injection 0-5  Units (0 Units Subcutaneous Not Given 01/02/21 2343)  insulin glargine-yfgn (SEMGLEE) injection 20 Units (has no administration in time range)  lactated ringers bolus 1,000 mL (0 mLs Intravenous Stopped 01/02/21 2210)  acetaminophen (TYLENOL) tablet 1,000 mg (1,000 mg Oral Given 01/02/21 1942)  cefTRIAXone (ROCEPHIN) 2 g in sodium chloride 0.9 % 100 mL IVPB (0 g Intravenous Stopped 01/02/21 2344)  lactated ringers bolus 1,000 mL (0 mLs Intravenous Stopped 01/03/21 0017)    ED Course  I have reviewed the triage vital signs and the nursing notes.  Pertinent labs & imaging results that were available during my care of the patient were reviewed by me and considered in my medical decision making (see chart for details).  Clinical Course as of 01/03/21 1500  Sat Jan 02, 2021  2001 Leukocytosis to 13.1.  Creatinine 1.2, consistent with baseline.  Hyperglycemic to 438 without acidemia or elevated anion gap. [CH]    Clinical Course User Index [CH] Violet Baldy, MD   MDM Rules/Calculators/A&P                           75 y.o. female with past medical history as above who presents for evaluation of hyperglycemia. Afebrile and hemodynamically stable.  Exam as detailed above. Leukocytosis to 13.1.  Creatinine 1.2, consistent with baseline.  Hyperglycemic to 438 without acidemia or elevated anion gap.  Urinalysis with leukocyte esterase, pyuria, and bacteriuria.  COVID-19 testing negative.    SIRS criteria met by abnormalities including HR (>90) and WBCs (>12000, <4000). Suspected urinary versus cutaneous source of infection. Patient was given 30 cc/kg bolus of IVFs as well as antibiotics once Code Sepsis called. Patient was given antibiotic coverage with Rocephin. Blood cultures x2 were drawn prior to infusion of antibiotics. Based on the above history, exam, and findings consistent with sepsis, I believe the patient requires hospital admission for further care.  Patient was discussed with hospitalist, who  agreed to admit the patient for further evaluation and management.  Final Clinical Impression(s) / ED Diagnoses Final diagnoses:  Hyperglycemia  Sepsis, due to unspecified organism, unspecified  whether acute organ dysfunction present [facility])    Rx / DC Orders ED Discharge Orders     None        Violet Baldy, MD 01/03/21 1504    Blanchie Dessert, MD 01/05/21 1240

## 2021-01-02 NOTE — ED Notes (Signed)
Received verbal report from [place] at this time

## 2021-01-02 NOTE — H&P (Addendum)
History and Physical    Wanda Jones [id number] DOB: Feb 24, 1946 DOA: 01/02/2021  PCP: Pcp, No Patient coming from: Nursing Home  Chief Complaint: Hyperglycemia  HPI: Wanda Jones is a 75 y.o. female with medical history significant of three-vessel CAD, type 1 diabetes, hypertension, hyperlipidemia, depression, hypothyroidism, overactive bladder, dementia, history of breast cancer presenting the ED via EMS from her nursing home for evaluation of malaise, flank pain, suprapubic abdominal pain, dysuria, rigors, and hyperglycemia.  She was given 1 L fluid bolus by EMS.  No fever recorded but per ED physician, patient was very warm to touch and having rigors.  Tachycardic.  Also ED physician concerned about possible foot cellulitis.  Labs notable for WBC 13.1.  Blood glucose 438.  Bicarb and anion gap normal.  BUN 26, creatinine 1.2 (baseline 1.0).  No significant elevation of lipase or LFTs.  Initial lactic acid 2.0, repeat pending.  COVID and influenza PCR negative.  UA negative for ketones, showing negative nitrite, trace leukocytes, 21-50 WBCs, and few bacteria.  Blood culture x2 pending. Patient was given Tylenol, ceftriaxone, and 2 L LR boluses.  CBG improved to 249.   Patient states her blood sugar normally is in the 200s but for the past few days it has been running so high that the meter is not showing her a value and instead reads "high."  She takes [place] 16 units daily and NovoLog 5 units 3 times a day with meals.  Today she started experiencing dysuria, low back pain, and chills.  Denies nausea, vomiting, or abdominal pain.  Denies cough, shortness of breath, or chest pain.  No other complaints.  Review of Systems:  All systems reviewed and apart from history of presenting illness, are negative.  Past medical history: See HPI.  Surgical history: Left leg excision of melanoma, right lumpectomy x2, left knee arthroscopy  Social history: History of ethanol use.  No drug or tobacco  use.  Family history: Mother had ESRD, hypertension.  Sister had lung cancer.  Father had CAD.  Maternal grandfather had TIA.  Son with congenital deafness.  Allergies  Allergen Reactions   Codeine Anaphylaxis   Prednisone Anaphylaxis   Prior to Admission medications   Not on File    Physical Exam: Vitals:   01/02/21 1948 01/02/21 2100 01/02/21 2145 01/02/21 2215  BP:  (!) 153/77 (!) 151/64 (!) 150/61  Pulse:  (!) 104 (!) 104 (!) 104  Resp:  (!) '23 20 19  '$ Temp: 99.3 F (37.4 C)     TempSrc: Oral     SpO2:  91% 94% 92%  Weight:      Height:        Physical Exam Constitutional:      General: She is not in acute distress. HENT:     Head: Normocephalic and atraumatic.     Mouth/Throat:     Mouth: Mucous membranes are dry.  Eyes:     Extraocular Movements: Extraocular movements intact.     Conjunctiva/sclera: Conjunctivae normal.  Cardiovascular:     Rate and Rhythm: Normal rate and regular rhythm.     Pulses: Normal pulses.  Pulmonary:     Effort: Pulmonary effort is normal. No respiratory distress.     Breath sounds: Normal breath sounds. No wheezing or rales.  Abdominal:     General: Bowel sounds are normal. There is no distension.     Palpations: Abdomen is soft.     Tenderness: There is no abdominal tenderness. There is no guarding or  rebound.  Musculoskeletal:        General: No swelling or tenderness.     Cervical back: Normal range of motion and neck supple.     Comments: Spine nontender to palpation No CVA tenderness bilaterally  Skin:    General: Skin is warm and dry.     Comments: Both feet appear erythematous and warm to touch.  Pulses intact.  However, left foot slightly more erythematous with erythema extending to the dorsum of the foot.  Neurological:     General: No focal deficit present.     Mental Status: She is alert and oriented to person, place, and time.     Labs on Admission: I have personally reviewed following labs and imaging  studies  CBC: Recent Labs  Lab 01/02/21 1647  WBC 13.1*  HGB 12.0  HCT 37.3  MCV 89.2  PLT [id number]   Basic Metabolic Panel: Recent Labs  Lab 01/02/21 1647  NA 135  K 4.5  CL 99  CO2 24  GLUCOSE 438*  BUN 26*  CREATININE 1.27*  CALCIUM 8.6*   GFR: Estimated Creatinine Clearance: 31.7 mL/min (A) (by C-G formula based on SCr of 1.27 mg/dL (H)). Liver Function Tests: Recent Labs  Lab 01/02/21 1847  AST 30  ALT 16  ALKPHOS 91  BILITOT 1.0  PROT 7.3  ALBUMIN 3.3*   Recent Labs  Lab 01/02/21 1847  LIPASE 52*   No results for input(s): AMMONIA in the last 168 hours. Coagulation Profile: No results for input(s): INR, PROTIME in the last 168 hours. Cardiac Enzymes: No results for input(s): CKTOTAL, CKMB, CKMBINDEX, TROPONINI in the last 168 hours. BNP (last 3 results) No results for input(s): PROBNP in the last 8760 hours. HbA1C: No results for input(s): HGBA1C in the last 72 hours. CBG: Recent Labs  Lab 01/02/21 1639 01/02/21 2055  GLUCAP 465* 249*   Lipid Profile: No results for input(s): CHOL, HDL, LDLCALC, TRIG, CHOLHDL, LDLDIRECT in the last 72 hours. Thyroid Function Tests: No results for input(s): TSH, T4TOTAL, FREET4, T3FREE, THYROIDAB in the last 72 hours. Anemia Panel: No results for input(s): VITAMINB12, FOLATE, FERRITIN, TIBC, IRON, RETICCTPCT in the last 72 hours. Urine analysis:    Component Value Date/Time   COLORURINE STRAW (A) 01/02/2021 2129   APPEARANCEUR CLEAR 01/02/2021 2129   LABSPEC 1.008 01/02/2021 2129   PHURINE 9.0 (H) 01/02/2021 2129   GLUCOSEU >=500 (A) 01/02/2021 2129   HGBUR MODERATE (A) 01/02/2021 2129   BILIRUBINUR NEGATIVE 01/02/2021 2129   KETONESUR NEGATIVE 01/02/2021 2129   PROTEINUR NEGATIVE 01/02/2021 2129   NITRITE NEGATIVE 01/02/2021 2129   LEUKOCYTESUR TRACE (A) 01/02/2021 2129    Radiological Exams on Admission: No results found.  EKG: Ordered and currently pending.  Assessment/Plan Principal Problem:    Sepsis ([place]) Active Problems:   UTI (urinary tract infection)   Cellulitis   Hyperglycemia   AKI (acute kidney injury) ([place])   Type 1 diabetes ([place])   Sepsis secondary to UTI and possible cellulitis Patient is endorsing dysuria, low back pain, and chills.  No nausea, vomiting, or abdominal pain.  Spine nontender to palpation.  No CVA tenderness bilaterally.  No fever recorded but she is quite warm to touch.  Tachycardic.  Labs showing mild leukocytosis and borderline lactic acidosis.  UA with mild pyuria and bacteriuria.  Both her feet are erythematous and warm to touch, however, left foot slightly more erythematous with erythema extending to the dorsum of the foot.  No wounds or drainage. -Continue  ceftriaxone.  Patient finished 30 cc/kg fluid boluses per sepsis protocol.  Continues to be mildly tachycardic but blood pressure stable, continue IV fluid hydration.  Order urine culture.  Blood culture x2 pending.  Trend WBC count and lactate.  Hyperglycemia in setting of poorly controlled type 1 diabetes A1c 10.9 in April 2022.  Hyperglycemia also likely precipitated by infection.  Blood glucose now improved to 200s after fluid boluses.  No signs of DKA. -Check A1c.  Increased dose of home Tresiba from 16 units to 20 units daily.  Continue home NovoLog 5 units 3 times daily with meals.  Order sliding scale insulin moderate ACHS.  Consult diabetes coordinator.  Mild AKI Likely prerenal from dehydration. -IV fluid hydration.  Continue to monitor renal function and urine output.  Avoid nephrotoxic agents.  CAD Stable.  Not endorsing any anginal symptoms.  Not on a beta-blocker per pharmacy med rec. -Continue Crestor  Hypertension Not on any antihypertensives per pharmacy med rec. -Continue to monitor  Hyperlipidemia -Continue Crestor  Dementia -Continue Aricept and Namenda.  Follow delirium precautions.  Depression -Continue Effexor  Hypothyroidism -Continue Synthroid  Overactive  bladder -Continue Myrbetriq  DVT prophylaxis: Lovenox Code Status: Patient wishes to be full code. Family Communication: No family available at this time. Disposition Plan: Status is: Inpatient  Remains inpatient appropriate because:Inpatient level of care appropriate due to severity of illness  Dispo: The patient is from: Home              Anticipated d/c is to: Home              Patient currently is not medically stable to d/c.   Difficult to place patient No  Level of care: Level of care: Telemetry Medical  The medical decision making on this patient was of high complexity and the patient is at high risk for clinical deterioration, therefore this is a level 3 visit.  Shela Leff MD [facility]  If 7PM-7AM, please contact night-coverage [email or web address]  01/02/2021, 11:11 PM

## 2021-01-02 NOTE — ED Triage Notes (Signed)
The pts son is on the way here from [place]

## 2021-01-03 DIAGNOSIS — N179 Acute kidney failure, unspecified: Secondary | ICD-10-CM | POA: Diagnosis not present

## 2021-01-03 DIAGNOSIS — L03119 Cellulitis of unspecified part of limb: Secondary | ICD-10-CM | POA: Diagnosis not present

## 2021-01-03 DIAGNOSIS — E1065 Type 1 diabetes mellitus with hyperglycemia: Secondary | ICD-10-CM

## 2021-01-03 DIAGNOSIS — N390 Urinary tract infection, site not specified: Secondary | ICD-10-CM

## 2021-01-03 DIAGNOSIS — R739 Hyperglycemia, unspecified: Secondary | ICD-10-CM | POA: Diagnosis not present

## 2021-01-03 LAB — CBG MONITORING, ED
Glucose-Capillary: 165 mg/dL — ABNORMAL HIGH (ref 70–99)
Glucose-Capillary: 87 mg/dL (ref 70–99)
Glucose-Capillary: 124 mg/dL — ABNORMAL HIGH (ref 70–99)
Glucose-Capillary: 313 mg/dL — ABNORMAL HIGH (ref 70–99)

## 2021-01-03 LAB — HEMOGLOBIN A1C
Hgb A1c MFr Bld: 9.6 % — ABNORMAL HIGH (ref 4.8–5.6)
Mean Plasma Glucose: 228.82 mg/dL

## 2021-01-03 LAB — GLUCOSE, CAPILLARY: Glucose-Capillary: 304 mg/dL — ABNORMAL HIGH (ref 70–99)

## 2021-01-03 MED ORDER — INSULIN ASPART 100 UNIT/ML IJ SOLN
0.0000 [IU] | Freq: Three times a day (TID) | INTRAMUSCULAR | Status: DC
  Administered 2021-01-03 – 2021-01-04 (×2): 4 [IU] via SUBCUTANEOUS
  Administered 2021-01-04: 1 [IU] via SUBCUTANEOUS
  Administered 2021-01-05: 4 [IU] via SUBCUTANEOUS
  Administered 2021-01-05: 2 [IU] via SUBCUTANEOUS

## 2021-01-03 MED ORDER — INSULIN ASPART 100 UNIT/ML IJ SOLN
4.0000 [IU] | Freq: Three times a day (TID) | INTRAMUSCULAR | Status: DC
  Administered 2021-01-03 – 2021-01-06 (×9): 4 [IU] via SUBCUTANEOUS

## 2021-01-03 MED ORDER — LACTATED RINGERS IV SOLN: INTRAVENOUS | Status: AC

## 2021-01-03 MED ORDER — INSULIN ASPART 100 UNIT/ML IJ SOLN
6.0000 [IU] | Freq: Once | INTRAMUSCULAR | Status: AC
  Administered 2021-01-03: 6 [IU] via SUBCUTANEOUS

## 2021-01-03 NOTE — ED Notes (Signed)
ED TO INPATIENT HANDOFF REPORT  ED Nurse Name and Phone #:  [phone]  S Name/Age/Gender Maximino Sarin 75 y.o. female Room/Bed: 004C/004C  Code Status   Code Status: Full Code  Home/SNF/Other Nursing Home Patient oriented to: self, time, and situation Is this baseline? Yes   Triage Complete: Triage complete  Chief Complaint Sepsis ([place]) [A41.9]  Triage Note The pt arrived by [facility]  from [place] feeling tired and lethargic since yesterday diabetic  glucose is high  since 1200n she took some insulin around then  when ems arrived cbg read high iv and one liter  of nss  brought the cbg down to 580 on arrival to the ed  The pts son is on the way here from [place]  Allergen Reactions   Codeine Anaphylaxis   Prednisone Anaphylaxis    Level of Care/Admitting Diagnosis ED Disposition     ED Disposition  Admit   Condition  --   [place]: [place] [[id number]]  Level of Care: Telemetry Medical [104]  May admit patient to [facility] or [facility] if equivalent level of care is available:: Yes  Covid Evaluation: Asymptomatic Screening Protocol (No Symptoms)  Diagnosis: [id number]  Admitting Physician: Shela Leff [id number]  Attending Physician: Shela Leff [id number]  Estimated length of stay: past midnight tomorrow  Certification:: I certify this patient will need inpatient services for at least 2 midnights          B Medical/Surgery History History reviewed. No pertinent past medical history. History reviewed. No pertinent surgical history.   A IV Location/Drains/Wounds Patient Lines/Drains/Airways Status     Active Line/Drains/Airways     Name Placement date Placement time Site Days   Peripheral IV 01/02/21 18 G Right Forearm 01/02/21  1648  Forearm  1   External Urinary Catheter 01/02/21  1934  --  1            Intake/Output Last 24 hours  Intake/Output Summary (Last  24 hours) at 01/03/2021 1922 Last data filed at 01/03/2021 [id number] Gross per 24 hour  Intake 2100 ml  Output 700 ml  Net 1400 ml    Labs/Imaging Results for orders placed or performed during the hospital encounter of 01/02/21 (from the past 48 hour(s))  CBG monitoring, ED     Status: Abnormal   Collection Time: 01/02/21  4:39 PM  Result Value Ref Range   Glucose-Capillary 465 (H) 70 - 99 mg/dL    Comment: Glucose reference range applies only to samples taken after fasting for at least 8 hours.  Basic metabolic panel     Status: Abnormal   Collection Time: 01/02/21  4:47 PM  Result Value Ref Range   Sodium 135 135 - 145 mmol/L   Potassium 4.5 3.5 - 5.1 mmol/L   Chloride 99 98 - 111 mmol/L   CO2 24 22 - 32 mmol/L   Glucose, Bld 438 (H) 70 - 99 mg/dL    Comment: Glucose reference range applies only to samples taken after fasting for at least 8 hours.   BUN 26 (H) 8 - 23 mg/dL   Creatinine, Ser 1.27 (H) 0.44 - 1.00 mg/dL   Calcium 8.6 (L) 8.9 - 10.3 mg/dL   GFR, Estimated 44 (L) >60 mL/min    Comment: (NOTE) Calculated using the CKD-EPI Creatinine Equation (2021)    Anion gap 12 5 - 15    Comment: Performed at [facility]  [address], [place], [place] [address]  CBC     Status: Abnormal   Collection Time: 01/02/21  4:47 PM  Result Value Ref Range   WBC 13.1 (H) 4.0 - 10.5 K/uL   RBC 4.18 3.87 - 5.11 MIL/uL   Hemoglobin 12.0 12.0 - 15.0 g/dL   HCT 37.3 36.0 - 46.0 %   MCV 89.2 80.0 - 100.0 fL   MCH 28.7 26.0 - 34.0 pg   MCHC 32.2 30.0 - 36.0 g/dL   RDW 12.5 11.5 - 15.5 %   Platelets 269 150 - 400 K/uL   nRBC 0.0 0.0 - 0.2 %    Comment: Performed at [facility], [place] [address] [place], [place] [address]  Lipase, blood     Status: Abnormal   Collection Time: 01/02/21  6:47 PM  Result Value Ref Range   Lipase 52 (H) 11 - 51 U/L    Comment: Performed at [facility] [address] [place], [place] [address]  Hepatic function panel     Status: Abnormal    Collection Time: 01/02/21  6:47 PM  Result Value Ref Range   Total Protein 7.3 6.5 - 8.1 g/dL   Albumin 3.3 (L) 3.5 - 5.0 g/dL   AST 30 15 - 41 U/L   ALT 16 0 - 44 U/L   Alkaline Phosphatase 91 38 - 126 U/L   Total Bilirubin 1.0 0.3 - 1.2 mg/dL   Bilirubin, Direct 0.4 (H) 0.0 - 0.2 mg/dL   Indirect Bilirubin 0.6 0.3 - 0.9 mg/dL    Comment: Performed at [facility] [address], [place], [place] [address]  Lactic acid, plasma     Status: Abnormal   Collection Time: 01/02/21  6:47 PM  Result Value Ref Range   Lactic Acid, Venous 2.0 (HH) 0.5 - 1.9 mmol/L    Comment: CRITICAL RESULT CALLED TO, READ BACK BY AND VERIFIED WITH:  Rosana Fret RN '@[id number]'$  01/02/21 K. SANDERS Performed at [facility], [place] [address], [place], [place] [address]   Resp Panel by RT-PCR (Flu A&B, Covid) Nasopharyngeal Swab     Status: None   Collection Time: 01/02/21  7:55 PM   Specimen: Nasopharyngeal Swab; Nasopharyngeal(NP) swabs in vial transport medium  Result Value Ref Range   SARS Coronavirus 2 by RT PCR NEGATIVE NEGATIVE    Comment: (NOTE) SARS-CoV-2 target nucleic acids are NOT DETECTED.  The SARS-CoV-2 RNA is generally detectable in upper respiratory specimens during the acute phase of infection. The lowest concentration of SARS-CoV-2 viral copies this assay can detect is 138 copies/mL. A negative result does not preclude SARS-Cov-2 infection and should not be used as the sole basis for treatment or other patient management decisions. A negative result may occur with  improper specimen collection/handling, submission of specimen other than nasopharyngeal swab, presence of viral mutation(s) within the areas targeted by this assay, and inadequate number of viral copies(<138 copies/mL). A negative result must be combined with clinical observations, patient history, and epidemiological information. The expected result is Negative.  Fact Sheet for Patients:   EntrepreneurPulse.com.au  Fact Sheet for Healthcare Providers:  IncredibleEmployment.be  This test is no t yet approved or cleared by the [place] FDA and  has been authorized for detection and/or diagnosis of SARS-CoV-2 by FDA under an Emergency Use Authorization (EUA). This EUA will remain  in effect (meaning this test can be used) for the duration of the COVID-19 declaration under Section 564(b)(1) of the Act,  21 U.S.C.section 360bbb-3(b)(1), unless the authorization is terminated  or revoked sooner.       Influenza A by PCR NEGATIVE NEGATIVE   Influenza B by PCR NEGATIVE NEGATIVE    Comment: (NOTE) The Xpert Xpress SARS-CoV-2/FLU/RSV plus assay is intended as an aid in the diagnosis of influenza from Nasopharyngeal swab specimens and should not be used as a sole basis for treatment. Nasal washings and aspirates are unacceptable for Xpert Xpress SARS-CoV-2/FLU/RSV testing.  Fact Sheet for Patients: EntrepreneurPulse.com.au  Fact Sheet for Healthcare Providers: IncredibleEmployment.be  This test is not yet approved or cleared by the [place] FDA and has been authorized for detection and/or diagnosis of SARS-CoV-2 by FDA under an Emergency Use Authorization (EUA). This EUA will remain in effect (meaning this test can be used) for the duration of the COVID-19 declaration under Section 564(b)(1) of the Act, 21 U.S.C. section 360bbb-3(b)(1), unless the authorization is terminated or revoked.  Performed at [name], [place] [address] [place], [place] [address]   CBG monitoring, ED     Status: Abnormal   Collection Time: 01/02/21  8:55 PM  Result Value Ref Range   Glucose-Capillary 249 (H) 70 - 99 mg/dL    Comment: Glucose reference range applies only to samples taken after fasting for at least 8 hours.  Urinalysis, Routine w reflex microscopic Urine, Clean Catch     Status: Abnormal    Collection Time: 01/02/21  9:29 PM  Result Value Ref Range   Color, Urine STRAW (A) YELLOW   APPearance CLEAR CLEAR   Specific Gravity, Urine 1.008 1.005 - 1.030   pH 9.0 (H) 5.0 - 8.0   Glucose, UA >=500 (A) NEGATIVE mg/dL   Hgb urine dipstick MODERATE (A) NEGATIVE   Bilirubin Urine NEGATIVE NEGATIVE   Ketones, ur NEGATIVE NEGATIVE mg/dL   Protein, ur NEGATIVE NEGATIVE mg/dL   Nitrite NEGATIVE NEGATIVE   Leukocytes,Ua TRACE (A) NEGATIVE   RBC / HPF 0-5 0 - 5 RBC/hpf   WBC, UA 21-50 0 - 5 WBC/hpf   Bacteria, UA FEW (A) NONE SEEN   Squamous Epithelial / LPF 0-5 0 - 5    Comment: Performed at [facility], [address] [place], [place] [address]  Lactic acid, plasma     Status: None   Collection Time: 01/02/21  9:57 PM  Result Value Ref Range   Lactic Acid, Venous 0.9 0.5 - 1.9 mmol/L    Comment: Performed at [facility] [address], [place], [place] [address]  CBG monitoring, ED     Status: Abnormal   Collection Time: 01/02/21 11:37 PM  Result Value Ref Range   Glucose-Capillary 199 (H) 70 - 99 mg/dL    Comment: Glucose reference range applies only to samples taken after fasting for at least 8 hours.  Blood culture (routine x 2)     Status: None (Preliminary result)   Collection Time: 01/03/21 12:22 AM   Specimen: BLOOD  Result Value Ref Range   Specimen Description BLOOD SITE NOT SPECIFIED    Special Requests      BOTTLES DRAWN AEROBIC AND ANAEROBIC Blood Culture results may not be optimal due to an inadequate volume of blood received in culture bottles Performed at [facility] [address], [place], [place] [address]    Culture PENDING    Report Status PENDING   Blood culture (routine x 2)     Status: None (Preliminary result)   Collection Time: 01/03/21  12:28 AM   Specimen: BLOOD  Result Value Ref Range   Specimen Description BLOOD SITE NOT SPECIFIED    Special Requests      BOTTLES DRAWN AEROBIC AND ANAEROBIC Blood Culture results may not  be optimal due to an inadequate volume of blood received in culture bottles Performed at [facility] [address] [place], [place] [address]    Culture PENDING    Report Status PENDING   Hemoglobin A1c     Status: Abnormal   Collection Time: 01/03/21 12:30 AM  Result Value Ref Range   Hgb A1c MFr Bld 9.6 (H) 4.8 - 5.6 %    Comment: (NOTE) Pre diabetes:          5.7%-6.4%  Diabetes:              >6.4%  Glycemic control for   <7.0% adults with diabetes    Mean Plasma Glucose 228.82 mg/dL    Comment: Performed at [facility] [address], [place], [place] [address]  CBG monitoring, ED     Status: Abnormal   Collection Time: 01/03/21  7:40 AM  Result Value Ref Range   Glucose-Capillary 165 (H) 70 - 99 mg/dL    Comment: Glucose reference range applies only to samples taken after fasting for at least 8 hours.  CBG monitoring, ED     Status: None   Collection Time: 01/03/21 12:15 PM  Result Value Ref Range   Glucose-Capillary 87 70 - 99 mg/dL    Comment: Glucose reference range applies only to samples taken after fasting for at least 8 hours.  CBG monitoring, ED     Status: Abnormal   Collection Time: 01/03/21  2:48 PM  Result Value Ref Range   Glucose-Capillary 124 (H) 70 - 99 mg/dL    Comment: Glucose reference range applies only to samples taken after fasting for at least 8 hours.  CBG monitoring, ED     Status: Abnormal   Collection Time: 01/03/21  6:05 PM  Result Value Ref Range   Glucose-Capillary 313 (H) 70 - 99 mg/dL    Comment: Glucose reference range applies only to samples taken after fasting for at least 8 hours.   No results found.  Pending Labs Unresulted Labs (From admission, onward)     Start     Ordered   01/04/21 0500  CBC  Tomorrow morning,   R        01/03/21 1330   01/04/21 0500  Comprehensive metabolic panel  Tomorrow morning,   R        01/03/21 1330   01/03/21 0500  CBC  Tomorrow morning,   R        01/02/21 2309   01/02/21  2306  Urine Culture  Add-on,   AD       Question:  Indication  Answer:  Sepsis   01/02/21 2309            Vitals/Pain Today's Vitals   01/03/21 1600 01/03/21 1700 01/03/21 1800 01/03/21 1900  BP: (!) 149/93 (!) 166/71 (!) 170/72 (!) 145/61  Pulse: (!) 106 (!) 107 (!) 106 100  Resp: (!) 25 (!) 29 (!) 26 16  Temp:      TempSrc:      SpO2: 97% 97% 95% 98%  Weight:      Height:      PainSc:        Isolation Precautions No active isolations  Medications Medications  rosuvastatin (  CRESTOR) tablet 5 mg (5 mg Oral Given 01/03/21 0016)  donepezil (ARICEPT) tablet 5 mg (5 mg Oral Given 01/03/21 0016)  memantine (NAMENDA) tablet 5 mg (0 mg Oral Hold 01/03/21 0849)  venlafaxine XR (EFFEXOR-XR) 24 hr capsule 150 mg (0 mg Oral Hold 01/03/21 0850)  levothyroxine (SYNTHROID) tablet 88 mcg (88 mcg Oral Given 01/03/21 0553)  mirabegron ER (MYRBETRIQ) tablet 50 mg (0 mg Oral Hold 01/03/21 1239)  enoxaparin (LOVENOX) injection 40 mg (40 mg Subcutaneous Given 01/03/21 0016)  acetaminophen (TYLENOL) tablet 650 mg (650 mg Oral Given 01/03/21 0536)    Or  acetaminophen (TYLENOL) suppository 650 mg ( Rectal See Alternative 01/03/21 0536)  0.9 %  sodium chloride infusion (0 mLs Intravenous Stopped 01/03/21 1551)  cefTRIAXone (ROCEPHIN) 2 g in sodium chloride 0.9 % 100 mL IVPB (has no administration in time range)  insulin glargine-yfgn (SEMGLEE) injection 20 Units (20 Units Subcutaneous Given 01/03/21 1004)  lactated ringers infusion ( Intravenous New Bag/Given 01/03/21 1553)  insulin aspart (novoLOG) injection 0-6 Units (4 Units Subcutaneous Given 01/03/21 1817)  insulin aspart (novoLOG) injection 4 Units (4 Units Subcutaneous Given 01/03/21 1818)  lactated ringers bolus 1,000 mL (0 mLs Intravenous Stopped 01/02/21 2210)  acetaminophen (TYLENOL) tablet 1,000 mg (1,000 mg Oral Given 01/02/21 1942)  cefTRIAXone (ROCEPHIN) 2 g in sodium chloride 0.9 % 100 mL IVPB (0 g Intravenous Stopped 01/02/21 2344)  lactated  ringers bolus 1,000 mL (0 mLs Intravenous Stopped 01/03/21 0017)    Mobility walks with person assist High fall risk   Focused Assessments    R Recommendations: See Admitting Provider Note  Report given to:   Additional Notes:

## 2021-01-03 NOTE — Progress Notes (Signed)
[facility]  PROGRESS NOTE  Wanda Jones [id number] DOB: 1945-08-08 DOA: 01/02/2021 PCP: Pcp, No   Brief HPI:   75 year old female history of three-vessel CAD, type 1 diabetes mellitus, hypertension, hyperlipidemia, depression, hypothyroidism, overactive bladder, dementia, history of breast cancer presented to ED with complaints of malaise, flank pain, suprapubic abdominal pain, dysuria, rigors and hyperglycemia. In the ED she was found to have abnormal UA and also possible cellulitis of lower extremities   Subjective   Patient seen and examined, still continues to have dysuria.  Denies pain in the lower extremities.   Assessment/Plan:    Sepsis -Secondary to UTI/cellulitis -Presented with tachycardia, tachypnea, fever(T-max one 101.7) -Also found to have abnormal UA and possible cellulitis -Started on ceftriaxone; received fluid boluses for sepsis -Follow blood and urine culture results  Poorly controlled diabetes mellitus type 1/hyperglycemia -Hemoglobin A1c was 10.9 in April 2022 -Presented with elevated blood glucose -Home dose of Tresiba increased to 20 units -Continue home dose of NovoLog 5 units 3 times daily with meals -Continue sliding scale insulin with NovoLog before every meal and at bedtime -Diabetes coordinator consulted  Mild AKI -Likely from dehydration and sepsis as above -Received fluid boluses for sepsis as above -Continue gentle IV hydration with LR at 75/h  CAD -Not on beta-blocker -Continue Crestor  Hypertension -Not on an hypertensive regimen per pharmacy med rec -Continue to monitor patient's blood pressure  Dementia -Continue Namenda, Aricept -No behavior disturbance  Depression -Continue Effexor  Hypothyroidism -Continue Synthroid  Overactive bladder -Continue Myrbetriq     Data Reviewed:   CBG:  Recent Labs  Lab 01/02/21 1639 01/02/21 2055 01/02/21 2337 01/03/21 0740 01/03/21 1215  GLUCAP 465* 249* 199* 165*  87    SpO2: 96 %    Vitals:   01/03/21 1100 01/03/21 1145 01/03/21 1215 01/03/21 1245  BP: (!) 109/47 106/62 123/71 129/63  Pulse: 74 76 74 79  Resp: 16 (!) '21 20 20  '$ Temp:      TempSrc:      SpO2: 95% 96% 97% 96%  Weight:      Height:         Intake/Output Summary (Last 24 hours) at 01/03/2021 1318 Last data filed at 01/03/2021 0628 Gross per 24 hour  Intake 2100 ml  Output 700 ml  Net 1400 ml    09/16 1901 - 09/18 0700 In: 2100  Out: 700 [Urine:700]  Filed Weights   01/02/21 1643  Weight: 59 kg    Data Reviewed: Basic Metabolic Panel: Recent Labs  Lab 01/02/21 1647  NA 135  K 4.5  CL 99  CO2 24  GLUCOSE 438*  BUN 26*  CREATININE 1.27*  CALCIUM 8.6*   Liver Function Tests: Recent Labs  Lab 01/02/21 1847  AST 30  ALT 16  ALKPHOS 91  BILITOT 1.0  PROT 7.3  ALBUMIN 3.3*   Recent Labs  Lab 01/02/21 1847  LIPASE 52*   No results for input(s): AMMONIA in the last 168 hours. CBC: Recent Labs  Lab 01/02/21 1647  WBC 13.1*  HGB 12.0  HCT 37.3  MCV 89.2  PLT 269   Cardiac Enzymes: No results for input(s): CKTOTAL, CKMB, CKMBINDEX, TROPONINI in the last 168 hours. BNP (last 3 results) Recent Labs    09/29/20 1833  BNP 100.8*    ProBNP (last 3 results) No results for input(s): PROBNP in the last 8760 hours.  CBG: Recent Labs  Lab 01/02/21 1639 01/02/21 2055 01/02/21 2337 01/03/21 0740 01/03/21 1215  GLUCAP  465* 249* 199* 165* 87    Recent Results (from the past 240 hour(s))  Resp Panel by RT-PCR (Flu A&B, Covid) Nasopharyngeal Swab     Status: None   Collection Time: 01/02/21  7:55 PM   Specimen: Nasopharyngeal Swab; Nasopharyngeal(NP) swabs in vial transport medium  Result Value Ref Range Status   SARS Coronavirus 2 by RT PCR NEGATIVE NEGATIVE Final    Comment: (NOTE) SARS-CoV-2 target nucleic acids are NOT DETECTED.  The SARS-CoV-2 RNA is generally detectable in upper respiratory specimens during the acute phase of  infection. The lowest concentration of SARS-CoV-2 viral copies this assay can detect is 138 copies/mL. A negative result does not preclude SARS-Cov-2 infection and should not be used as the sole basis for treatment or other patient management decisions. A negative result may occur with  improper specimen collection/handling, submission of specimen other than nasopharyngeal swab, presence of viral mutation(s) within the areas targeted by this assay, and inadequate number of viral copies(<138 copies/mL). A negative result must be combined with clinical observations, patient history, and epidemiological information. The expected result is Negative.  Fact Sheet for Patients:  EntrepreneurPulse.com.au  Fact Sheet for Healthcare Providers:  IncredibleEmployment.be  This test is no t yet approved or cleared by the [place] FDA and  has been authorized for detection and/or diagnosis of SARS-CoV-2 by FDA under an Emergency Use Authorization (EUA). This EUA will remain  in effect (meaning this test can be used) for the duration of the COVID-19 declaration under Section 564(b)(1) of the Act, 21 U.S.C.section 360bbb-3(b)(1), unless the authorization is terminated  or revoked sooner.       Influenza A by PCR NEGATIVE NEGATIVE Final   Influenza B by PCR NEGATIVE NEGATIVE Final    Comment: (NOTE) The Xpert Xpress SARS-CoV-2/FLU/RSV plus assay is intended as an aid in the diagnosis of influenza from Nasopharyngeal swab specimens and should not be used as a sole basis for treatment. Nasal washings and aspirates are unacceptable for Xpert Xpress SARS-CoV-2/FLU/RSV testing.  Fact Sheet for Patients: EntrepreneurPulse.com.au  Fact Sheet for Healthcare Providers: IncredibleEmployment.be  This test is not yet approved or cleared by the [place] FDA and has been authorized for detection and/or diagnosis of SARS-CoV-2  by FDA under an Emergency Use Authorization (EUA). This EUA will remain in effect (meaning this test can be used) for the duration of the COVID-19 declaration under Section 564(b)(1) of the Act, 21 U.S.C. section 360bbb-3(b)(1), unless the authorization is terminated or revoked.  Performed at [facility], [place] [address], [place], [place] [address]   Blood culture (routine x 2)     Status: None (Preliminary result)   Collection Time: 01/03/21 12:22 AM   Specimen: BLOOD  Result Value Ref Range Status   Specimen Description BLOOD SITE NOT SPECIFIED  Final   Special Requests   Final    BOTTLES DRAWN AEROBIC AND ANAEROBIC Blood Culture results may not be optimal due to an inadequate volume of blood received in culture bottles Performed at [facility] [address], [place], [place] [address]    Culture PENDING  Incomplete   Report Status PENDING  Incomplete  Blood culture (routine x 2)     Status: None (Preliminary result)   Collection Time: 01/03/21 12:28 AM   Specimen: BLOOD  Result Value Ref Range Status   Specimen Description BLOOD SITE NOT SPECIFIED  Final   Special Requests   Final    BOTTLES DRAWN AEROBIC AND ANAEROBIC Blood  Culture results may not be optimal due to an inadequate volume of blood received in culture bottles Performed at [name], [place] [address], [place], [place] [address]    Culture PENDING  Incomplete   Report Status PENDING  Incomplete        Scheduled medications:    donepezil  5 mg Oral QHS   enoxaparin (LOVENOX) injection  40 mg Subcutaneous QHS   insulin aspart  0-15 Units Subcutaneous TID WC   insulin aspart  0-5 Units Subcutaneous QHS   insulin aspart  5 Units Subcutaneous TID WC   insulin glargine-yfgn  20 Units Subcutaneous Daily   levothyroxine  88 mcg Oral QAC breakfast   memantine  5 mg Oral BID   mirabegron ER  50 mg Oral Daily   rosuvastatin  5 mg Oral QHS   venlafaxine XR  150 mg Oral Q breakfast     Antibiotics: Anti-infectives (From admission, onward)    Start     Dose/Rate Route Frequency Ordered Stop   01/03/21 2200  cefTRIAXone (ROCEPHIN) 2 g in sodium chloride 0.9 % 100 mL IVPB        2 g 200 mL/hr over 30 Minutes Intravenous Every 24 hours 01/02/21 2309 01/10/21 2159   01/02/21 2215  cefTRIAXone (ROCEPHIN) 2 g in sodium chloride 0.9 % 100 mL IVPB        2 g 200 mL/hr over 30 Minutes Intravenous  Once 01/02/21 2203 01/02/21 2344        Radiology Reports  No results found.    DVT prophylaxis: Lovenox  Code Status: Full code  Family Communication: No family at bedside   Consultants:   Procedures:     Objective    Physical Examination:   General-appears in no acute distress Heart-S1-S2, regular, no murmur auscultated Lungs-clear to auscultation bilaterally, no wheezing or crackles auscultated Abdomen-soft, nontender, no organomegaly Extremities-trace edema in the lower extremities, mild erythema noted bilaterally Neuro-alert, oriented to self and place only, no focal deficit noted  Status is: Inpatient  Dispo: The patient is from: Home              Anticipated d/c is to: Home              Anticipated d/c date is: 01/05/2021              Patient currently not stable for discharge  Barrier to discharge-ongoing treatment for cellulitis  COVID-19 Labs  No results for input(s): DDIMER, FERRITIN, LDH, CRP in the last 72 hours.  Lab Results  Component Value Date   [place] NEGATIVE 01/02/2021              Oswald Hillock   [facility] If 7PM-7AM, please contact night-coverage at [email or web address], Office  [phone]   01/03/2021, 1:18 PM  LOS: 1 day

## 2021-01-03 NOTE — ED Notes (Addendum)
Pt appears lethargic, arousable with voice and minor stimulation. Will talk then falls asleep. Was able to wake her up by turning room lights on and sit her bed up, able to eat 10% of meal and drank water. Alert to self, was able to say she's in [place] but cannot say what facility is this, knows her DOB but not aware of current year. Attending MD at bedside.

## 2021-01-03 NOTE — Progress Notes (Signed)
Inpatient Diabetes Program Recommendations  AACE/ADA: New Consensus Statement on Inpatient Glycemic Control (2015)  Target Ranges:  Prepandial:   less than 140 mg/dL      Peak postprandial:   less than 180 mg/dL (1-2 hours)      Critically ill patients:  140 - 180 mg/dL   Lab Results  Component Value Date   GLUCAP 87 01/03/2021   HGBA1C 9.6 (H) 01/03/2021    Review of Glycemic Control Results for Wanda Jones, Wanda Jones (MRN [id number]) as of 01/03/2021 14:11  Ref. Range 01/02/2021 23:37 01/03/2021 07:40 01/03/2021 12:15  Glucose-Capillary Latest Ref Range: 70 - 99 mg/dL 199 (H) 165 (H) 87   Diabetes history: DM 1 Outpatient Diabetes medications:  Novolog 5 units tid with meals, Tresiba 16 units daily Current orders for Inpatient glycemic control:  Novolog moderate tid with meals and HS Novolog 5 units tid with meals Semglee 20 units daily  Inpatient Diabetes Program Recommendations:    Consider reducing Novolog correction to very sensitive (0-6 units) tid with meals.  Also consider reducing Novolog to 4 units tid with meals.   Thanks,  Adah Perl, RN, BC-ADM Inpatient Diabetes Coordinator Pager [phone]  (8a-5p)

## 2021-01-03 NOTE — ED Notes (Signed)
Pt cleaned and changed gown and linen.

## 2021-01-04 DIAGNOSIS — A419 Sepsis, unspecified organism: Secondary | ICD-10-CM | POA: Diagnosis not present

## 2021-01-04 DIAGNOSIS — N179 Acute kidney failure, unspecified: Secondary | ICD-10-CM | POA: Diagnosis not present

## 2021-01-04 DIAGNOSIS — R739 Hyperglycemia, unspecified: Secondary | ICD-10-CM | POA: Diagnosis not present

## 2021-01-04 DIAGNOSIS — L03119 Cellulitis of unspecified part of limb: Secondary | ICD-10-CM | POA: Diagnosis not present

## 2021-01-04 LAB — CBC
HCT: 33.1 % — ABNORMAL LOW (ref 36.0–46.0)
Hemoglobin: 10.7 g/dL — ABNORMAL LOW (ref 12.0–15.0)
MCH: 28.3 pg (ref 26.0–34.0)
MCHC: 32.3 g/dL (ref 30.0–36.0)
MCV: 87.6 fL (ref 80.0–100.0)
Platelets: 215 10*3/uL (ref 150–400)
RBC: 3.78 MIL/uL — ABNORMAL LOW (ref 3.87–5.11)
RDW: 12.3 % (ref 11.5–15.5)
WBC: 6.2 10*3/uL (ref 4.0–10.5)
nRBC: 0 % (ref 0.0–0.2)

## 2021-01-04 LAB — GLUCOSE, CAPILLARY
Glucose-Capillary: 219 mg/dL — ABNORMAL HIGH (ref 70–99)
Glucose-Capillary: 308 mg/dL — ABNORMAL HIGH (ref 70–99)
Glucose-Capillary: 198 mg/dL — ABNORMAL HIGH (ref 70–99)
Glucose-Capillary: 198 mg/dL — ABNORMAL HIGH (ref 70–99)
Glucose-Capillary: 332 mg/dL — ABNORMAL HIGH (ref 70–99)

## 2021-01-04 LAB — COMPREHENSIVE METABOLIC PANEL
ALT: 11 U/L (ref 0–44)
AST: 15 U/L (ref 15–41)
Albumin: 2.3 g/dL — ABNORMAL LOW (ref 3.5–5.0)
Alkaline Phosphatase: 68 U/L (ref 38–126)
Anion gap: 11 (ref 5–15)
BUN: 12 mg/dL (ref 8–23)
CO2: 24 mmol/L (ref 22–32)
Calcium: 8.1 mg/dL — ABNORMAL LOW (ref 8.9–10.3)
Chloride: 99 mmol/L (ref 98–111)
Creatinine, Ser: 0.96 mg/dL (ref 0.44–1.00)
GFR, Estimated: >60 mL/min (ref >60)
Glucose, Bld: 187 mg/dL — ABNORMAL HIGH (ref 70–99)
Potassium: 3 mmol/L — ABNORMAL LOW (ref 3.5–5.1)
Sodium: 134 mmol/L — ABNORMAL LOW (ref 135–145)
Total Bilirubin: 0.6 mg/dL (ref 0.3–1.2)
Total Protein: 5.4 g/dL — ABNORMAL LOW (ref 6.5–8.1)

## 2021-01-04 MED ORDER — POTASSIUM CHLORIDE CRYS ER 20 MEQ PO TBCR
40.0000 meq | EXTENDED_RELEASE_TABLET | ORAL | Status: AC
  Administered 2021-01-04 (×2): 40 meq via ORAL
  Filled 2021-01-04 (×2): qty 2

## 2021-01-04 MED ORDER — AMLODIPINE BESYLATE 5 MG PO TABS
5.0000 mg | ORAL_TABLET | Freq: Every day | ORAL | Status: DC
  Administered 2021-01-04 – 2021-01-06 (×3): 5 mg via ORAL
  Filled 2021-01-04 (×3): qty 1

## 2021-01-04 MED ORDER — POTASSIUM CHLORIDE CRYS ER 20 MEQ PO TBCR
40.0000 meq | EXTENDED_RELEASE_TABLET | Freq: Once | ORAL | Status: AC
  Administered 2021-01-04: 40 meq via ORAL
  Filled 2021-01-04: qty 2

## 2021-01-04 NOTE — Plan of Care (Signed)
°  Problem: Coping: °Goal: Level of anxiety will decrease °Outcome: Progressing °  °

## 2021-01-04 NOTE — TOC Initial Note (Signed)
Transition of Care [facility]) - Initial/Assessment Note    Patient Details  Name: Wanda Jones MRN: [id number] Date of Birth: [birth year]  [facility]) CM/SW Contact:    Angelita Ingles, RN Phone Number:[phone]  01/04/2021, 3:44 PM  Clinical Narrative:                 Patient is from [facility] independent [facility]. Patient reports that she functions independently at [facility]  and does have CNA's that check on her in intermittently. Patient states that she has a rollator, shower seat, glucometer and reacher at home to assist her with her independence. Patient has transportation to appointments and follows with her PCP. There are currently no needs. TOC will continue to follow Expected Discharge Plan: Home/Self Care Barriers to Discharge: Continued Medical Work up   Patient Goals and CMS Choice Patient states their goals for this hospitalization and ongoing recovery are:: Patient wants to go home   Choice offered to / list presented to : NA  Expected Discharge Plan and Services Expected Discharge Plan: Home/Self Care In-house Referral: NA Discharge Planning Services: CM Consult Post Acute Care Choice: NA Living arrangements for the past 2 months: [place]                 DME Arranged: N/A DME Agency: NA       HH Arranged: NA [place] Agency: NA        Prior Living Arrangements/Services Living arrangements for the past 2 months: [place] Lives with:: Self              Current home services:  (n/a)    Activities of Daily Living Home Assistive Devices/Equipment: CBG Meter, Shower chair without back, Environmental consultant (specify type) ADL Screening (condition at time of admission) Patient's cognitive ability adequate to safely complete daily activities?: Yes Is the patient deaf or have difficulty hearing?: No Does the patient have difficulty seeing, even when wearing glasses/contacts?: No Does the patient have difficulty concentrating,  remembering, or making decisions?: No Patient able to express need for assistance with ADLs?: Yes Does the patient have difficulty dressing or bathing?: Yes Independently performs ADLs?: No Communication: Independent Dressing (OT): Needs assistance Is this a change from baseline?: Pre-admission baseline Grooming: Needs assistance Is this a change from baseline?: Pre-admission baseline Feeding: Independent Bathing: Needs assistance Is this a change from baseline?: Pre-admission baseline Toileting: Independent Is this a change from baseline?: Pre-admission baseline In/Out Bed: Independent with device (comment) Is this a change from baseline?: Pre-admission baseline Walks in Home: Independent with device (comment), Needs assistance Is this a change from baseline?: Pre-admission baseline Does the patient have difficulty walking or climbing stairs?: Yes Weakness of Legs: Both Weakness of Arms/Hands: Both  Permission Sought/Granted                  Emotional Assessment              Admission diagnosis:  Hyperglycemia [R73.9] Sepsis ([place]) [A41.9] Sepsis, due to unspecified organism, unspecified whether acute organ dysfunction present [facility]) [A41.9] Patient Active Problem List   Diagnosis Date Noted   Sepsis ([place]) 01/02/2021   UTI (urinary tract infection) 01/02/2021   Cellulitis 01/02/2021   Hyperglycemia 01/02/2021   AKI (acute kidney injury) ([place]) 01/02/2021   Type 1 diabetes ([place]) 01/02/2021   PCP:  Merryl Hacker, No Pharmacy:   [facility], [place] - [address] [place] [address] Phone: [phone] Fax: [phone]  Social Determinants of Health (SDOH) Interventions    Readmission Risk Interventions Readmission Risk Prevention Plan 01/04/2021  Post Dischage Appt Complete  Medication Screening Complete  Transportation Screening Complete

## 2021-01-04 NOTE — Progress Notes (Signed)
[facility]  PROGRESS NOTE  Wanda Jones RWE:[id number] DOB: 16-Apr-1946 DOA: 01/02/2021 PCP: Pcp, No   Brief HPI:   75 year old female history of three-vessel CAD, type 1 diabetes mellitus, hypertension, hyperlipidemia, depression, hypothyroidism, overactive bladder, dementia, history of breast cancer presented to ED with complaints of malaise, flank pain, suprapubic abdominal pain, dysuria, rigors and hyperglycemia. In the ED she was found to have abnormal UA and also possible cellulitis of lower extremities   Subjective   Denies pain in lower extremities.  Feels better today.   Assessment/Plan:    Sepsis -Secondary to UTI/cellulitis -Presented with tachycardia, tachypnea, fever(T-max one 101.7) -Also found to have abnormal UA and possible cellulitis -Started on ceftriaxone; received fluid boluses for sepsis.  Sepsis physiology has resolved -Urine culture growing E. coli and Proteus mirabilis; final culture and sensitivity results pending  Poorly controlled diabetes mellitus type 1/hyperglycemia -Hemoglobin A1c was 10.9 in April 2022 -Presented with elevated blood glucose -Home dose of Tresiba increased to 20 units -Continue home dose of NovoLog 5 units 3 times daily with meals -Continue sliding scale insulin with NovoLog before every meal and at bedtime -CBG fairly well controlled  Mild AKI -Resolved with IV fluids -Likely from dehydration and sepsis as above -Received fluid boluses for sepsis as above -We will change IV fluids to KVO  Hypokalemia -Potassium was 3.0 -We will replace potassium and follow BMP in am  CAD -Not on beta-blocker -Continue Crestor  Hypertension -Not on an hypertensive regimen per pharmacy med rec -Blood pressure is mildly elevated -Start amlodipine 5 mg p.o. daily  Dementia -Continue Namenda, Aricept -No behavior disturbance  Depression -Continue Effexor  Hypothyroidism -Continue Synthroid  Overactive bladder -Continue  Myrbetriq     Data Reviewed:   CBG:  Recent Labs  Lab 01/03/21 1805 01/03/21 2107 01/04/21 0721 01/04/21 0855 01/04/21 1132  GLUCAP 313* 304* 219* 308* 198*    SpO2: 95 %    Vitals:   01/03/21 2044 01/03/21 2114 01/04/21 0300 01/04/21 0718  BP:  (!) 150/70 (!) 145/56 (!) 136/57  Pulse:  95 100 95  Resp:  18 16 18   Temp: 99.1 F (37.3 C) 97.6 F (36.4 C) 97.6 F (36.4 C) 99.6 F (37.6 C)  TempSrc: Oral  Axillary Oral  SpO2:  99% 99% 95%  Weight:      Height:         Intake/Output Summary (Last 24 hours) at 01/04/2021 1459 Last data filed at 01/04/2021 0855 Gross per 24 hour  Intake 342.6 ml  Output 500 ml  Net -157.4 ml    09/17 1901 - 09/19 0700 In: 2202.6 [I.V.:2.6] Out: 700 [Urine:700]  Filed Weights   01/02/21 1643  Weight: 59 kg    Data Reviewed: Basic Metabolic Panel: Recent Labs  Lab 01/02/21 1647 01/04/21 0534  NA 135 134*  K 4.5 3.0*  CL 99 99  CO2 24 24  GLUCOSE 438* 187*  BUN 26* 12  CREATININE 1.27* 0.96  CALCIUM 8.6* 8.1*   Liver Function Tests: Recent Labs  Lab 01/02/21 1847 01/04/21 0534  AST 30 15  ALT 16 11  ALKPHOS 91 68  BILITOT 1.0 0.6  PROT 7.3 5.4*  ALBUMIN 3.3* 2.3*   Recent Labs  Lab 01/02/21 1847  LIPASE 52*   No results for input(s): AMMONIA in the last 168 hours. CBC: Recent Labs  Lab 01/02/21 1647 01/04/21 0534  WBC 13.1* 6.2  HGB 12.0 10.7*  HCT 37.3 33.1*  MCV 89.2 87.6  PLT 269  215   Cardiac Enzymes: No results for input(s): CKTOTAL, CKMB, CKMBINDEX, TROPONINI in the last 168 hours. BNP (last 3 results) Recent Labs    09/29/20 1833  BNP 100.8*    ProBNP (last 3 results) No results for input(s): PROBNP in the last 8760 hours.  CBG: Recent Labs  Lab 01/03/21 1805 01/03/21 2107 01/04/21 0721 01/04/21 0855 01/04/21 1132  GLUCAP 313* 304* 219* 308* 198*    Recent Results (from the past 240 hour(s))  Resp Panel by RT-PCR (Flu A&B, Covid) Nasopharyngeal Swab     Status: None    Collection Time: 01/02/21  7:55 PM   Specimen: Nasopharyngeal Swab; Nasopharyngeal(NP) swabs in vial transport medium  Result Value Ref Range Status   SARS Coronavirus 2 by RT PCR NEGATIVE NEGATIVE Final    Comment: (NOTE) SARS-CoV-2 target nucleic acids are NOT DETECTED.  The SARS-CoV-2 RNA is generally detectable in upper respiratory specimens during the acute phase of infection. The lowest concentration of SARS-CoV-2 viral copies this assay can detect is 138 copies/mL. A negative result does not preclude SARS-Cov-2 infection and should not be used as the sole basis for treatment or other patient management decisions. A negative result may occur with  improper specimen collection/handling, submission of specimen other than nasopharyngeal swab, presence of viral mutation(s) within the areas targeted by this assay, and inadequate number of viral copies(<138 copies/mL). A negative result must be combined with clinical observations, patient history, and epidemiological information. The expected result is Negative.  Fact Sheet for Patients:  EntrepreneurPulse.com.au  Fact Sheet for Healthcare Providers:  IncredibleEmployment.be  This test is no t yet approved or cleared by the [place] FDA and  has been authorized for detection and/or diagnosis of SARS-CoV-2 by FDA under an Emergency Use Authorization (EUA). This EUA will remain  in effect (meaning this test can be used) for the duration of the COVID-19 declaration under Section 564(b)(1) of the Act, 21 U.S.C.section 360bbb-3(b)(1), unless the authorization is terminated  or revoked sooner.       Influenza A by PCR NEGATIVE NEGATIVE Final   Influenza B by PCR NEGATIVE NEGATIVE Final    Comment: (NOTE) The Xpert Xpress SARS-CoV-2/FLU/RSV plus assay is intended as an aid in the diagnosis of influenza from Nasopharyngeal swab specimens and should not be used as a sole basis for treatment.  Nasal washings and aspirates are unacceptable for Xpert Xpress SARS-CoV-2/FLU/RSV testing.  Fact Sheet for Patients: EntrepreneurPulse.com.au  Fact Sheet for Healthcare Providers: IncredibleEmployment.be  This test is not yet approved or cleared by the [place] FDA and has been authorized for detection and/or diagnosis of SARS-CoV-2 by FDA under an Emergency Use Authorization (EUA). This EUA will remain in effect (meaning this test can be used) for the duration of the COVID-19 declaration under Section 564(b)(1) of the Act, 21 U.S.C. section 360bbb-3(b)(1), unless the authorization is terminated or revoked.  Performed at [facility], [place] [address] [place], [place] [address]   Urine Culture     Status: Abnormal (Preliminary result)   Collection Time: 01/02/21  9:29 PM   Specimen: Urine, Clean Catch  Result Value Ref Range Status   Specimen Description URINE, CLEAN CATCH  Final   Special Requests NONE  Final   Culture (A)  Final    50,000 COLONIES/mL ESCHERICHIA COLI 10,000 COLONIES/mL PROTEUS MIRABILIS SUSCEPTIBILITIES TO FOLLOW Performed at [facility], [place] [address] [place], [place] [address]    Report Status PENDING  Incomplete  Blood culture (  routine x 2)     Status: None (Preliminary result)   Collection Time: 01/03/21 12:22 AM   Specimen: BLOOD  Result Value Ref Range Status   Specimen Description BLOOD SITE NOT SPECIFIED  Final   Special Requests   Final    BOTTLES DRAWN AEROBIC AND ANAEROBIC Blood Culture results may not be optimal due to an inadequate volume of blood received in culture bottles   Culture   Final    NO GROWTH 1 DAY Performed at [facility], [place] [address], [place], [place] [address]    Report Status PENDING  Incomplete  Blood culture (routine x 2)     Status: None (Preliminary result)   Collection Time: 01/03/21 12:28 AM   Specimen: BLOOD  Result Value Ref Range Status   Specimen  Description BLOOD SITE NOT SPECIFIED  Final   Special Requests   Final    BOTTLES DRAWN AEROBIC AND ANAEROBIC Blood Culture results may not be optimal due to an inadequate volume of blood received in culture bottles   Culture   Final    NO GROWTH 1 DAY Performed at [facility], [place] [address] [place],  [address]    Report Status PENDING  Incomplete        Scheduled medications:    donepezil  5 mg Oral QHS   enoxaparin (LOVENOX) injection  40 mg Subcutaneous QHS   insulin aspart  0-6 Units Subcutaneous TID WC   insulin aspart  4 Units Subcutaneous TID WC   insulin glargine-yfgn  20 Units Subcutaneous Daily   levothyroxine  88 mcg Oral QAC breakfast   memantine  5 mg Oral BID   mirabegron ER  50 mg Oral Daily   rosuvastatin  5 mg Oral QHS   venlafaxine XR  150 mg Oral Q breakfast    Antibiotics: Anti-infectives (From admission, onward)    Start     Dose/Rate Route Frequency Ordered Stop   01/03/21 2200  cefTRIAXone (ROCEPHIN) 2 g in sodium chloride 0.9 % 100 mL IVPB        2 g 200 mL/hr over 30 Minutes Intravenous Every 24 hours 01/02/21 2309 01/10/21 2159   01/02/21 2215  cefTRIAXone (ROCEPHIN) 2 g in sodium chloride 0.9 % 100 mL IVPB        2 g 200 mL/hr over 30 Minutes Intravenous  Once 01/02/21 2203 01/02/21 2344        Radiology Reports  No results found.    DVT prophylaxis: Lovenox  Code Status: Full code  Family Communication: No family at bedside   Consultants:   Procedures:     Objective    Physical Examination:   General-appears in no acute distress Heart-S1-S2, regular, no murmur auscultated Lungs-clear to auscultation bilaterally, no wheezing or crackles auscultated Abdomen-soft, nontender, no organomegaly Extremities-no edema in the lower extremities Neuro-alert, oriented x3, no focal deficit noted  Status is: Inpatient  Dispo: The patient is from: Home              Anticipated d/c is to: Home               Anticipated d/c date is: 01/05/2021              Patient currently not stable for discharge  Barrier to discharge-ongoing treatment for cellulitis  COVID-19 Labs  No results for input(s): DDIMER, FERRITIN, LDH, CRP in the last 72 hours.  Lab Results  Component Value Date   SARSCOV2NAA NEGATIVE  01/02/2021              Oswald Hillock   [facility] If 7PM-7AM, please contact night-coverage at [email or web address], Office  [phone]   01/04/2021, 2:59 PM  LOS: 2 days

## 2021-01-04 NOTE — TOC Progression Note (Signed)
Transition of Care [facility]) - Progression Note    Patient Details  Name: Wanda Jones MRN: [id number] Date of Birth: [birth year]  Transition of Care [facility]) CM/SW [place], RN Phone Number:[phone]  01/04/2021, 3:23 PM  Clinical Narrative:    Patient is from [facility] independent living. TOC following for any needs.         Expected Discharge Plan and Services                                                 Social Determinants of Health (SDOH) Interventions    Readmission Risk Interventions No flowsheet data found.

## 2021-01-04 NOTE — Progress Notes (Signed)
Inpatient Diabetes Program Recommendations  AACE/ADA: New Consensus Statement on Inpatient Glycemic Control (2015)  Target Ranges:  Prepandial:   less than 140 mg/dL      Peak postprandial:   less than 180 mg/dL (1-2 hours)      Critically ill patients:  140 - 180 mg/dL   Lab Results  Component Value Date   GLUCAP 308 (H) 01/04/2021   HGBA1C 9.6 (H) 01/03/2021    Review of Glycemic Control Results for Wanda Jones, Wanda Jones (MRN [id number]) as of 01/04/2021 10:17  Ref. Range 01/03/2021 14:48 01/03/2021 18:05 01/03/2021 21:07 01/04/2021 07:21 01/04/2021 08:55  Glucose-Capillary Latest Ref Range: 70 - 99 mg/dL 124 (H) 313 (H) 304 (H) 219 (H) 308 (H)   Diabetes history: DM 1- for 53 years Outpatient Diabetes medications:  Novolog 5 units tid with meals, Tresiba 16 units daily Current orders for Inpatient glycemic control:  Novolog moderate tid with meals and HS Novolog 5 units tid with meals Semglee 20 units daily Inpatient Diabetes Program Recommendations:    Spoke with patient by phone.  She has had DM for 53 years.  She wears a CGM sensor on her arm.  She states that blood sugars do fluctuate at home.  Discussed importance of avoiding hypoglycemia- patient agrees stating "low blood sugars are terrible".  A1C is higher than goal however due to age and length of time having DM, this may be appropriate for her.  Will follow.  Patient appreciative of call.   Thanks  Adah Perl, RN, BC-ADM Inpatient Diabetes Coordinator Pager [phone]  (8a-5p)

## 2021-01-05 DIAGNOSIS — E1065 Type 1 diabetes mellitus with hyperglycemia: Secondary | ICD-10-CM | POA: Diagnosis not present

## 2021-01-05 DIAGNOSIS — L03119 Cellulitis of unspecified part of limb: Secondary | ICD-10-CM | POA: Diagnosis not present

## 2021-01-05 DIAGNOSIS — N179 Acute kidney failure, unspecified: Secondary | ICD-10-CM | POA: Diagnosis not present

## 2021-01-05 DIAGNOSIS — A419 Sepsis, unspecified organism: Secondary | ICD-10-CM | POA: Diagnosis not present

## 2021-01-05 LAB — URINE CULTURE: Culture: 50000 — AB

## 2021-01-05 LAB — BASIC METABOLIC PANEL
Anion gap: 10 (ref 5–15)
BUN: 14 mg/dL (ref 8–23)
CO2: 23 mmol/L (ref 22–32)
Calcium: 8.6 mg/dL — ABNORMAL LOW (ref 8.9–10.3)
Chloride: 103 mmol/L (ref 98–111)
Creatinine, Ser: 1.14 mg/dL — ABNORMAL HIGH (ref 0.44–1.00)
GFR, Estimated: 50 mL/min — ABNORMAL LOW (ref >60)
Glucose, Bld: 313 mg/dL — ABNORMAL HIGH (ref 70–99)
Potassium: 4.4 mmol/L (ref 3.5–5.1)
Sodium: 136 mmol/L (ref 135–145)

## 2021-01-05 LAB — GLUCOSE, CAPILLARY
Glucose-Capillary: 212 mg/dL — ABNORMAL HIGH (ref 70–99)
Glucose-Capillary: 309 mg/dL — ABNORMAL HIGH (ref 70–99)
Glucose-Capillary: 461 mg/dL — ABNORMAL HIGH (ref 70–99)
Glucose-Capillary: 412 mg/dL — ABNORMAL HIGH (ref 70–99)
Glucose-Capillary: 63 mg/dL — ABNORMAL LOW (ref 70–99)
Glucose-Capillary: 72 mg/dL (ref 70–99)

## 2021-01-05 MED ORDER — TRESIBA FLEXTOUCH 100 UNIT/ML ~~LOC~~ SOPN: 20.0000 [IU] | PEN_INJECTOR | Freq: Every day | SUBCUTANEOUS | Status: DC

## 2021-01-05 MED ORDER — MELATONIN 3 MG PO TABS: 3.0000 mg | ORAL_TABLET | Freq: Every evening | ORAL | Status: DC | PRN

## 2021-01-05 MED ORDER — INSULIN ASPART 100 UNIT/ML IJ SOLN: 0.0000 [IU] | Freq: Three times a day (TID) | INTRAMUSCULAR | Status: DC

## 2021-01-05 MED ORDER — INSULIN ASPART 100 UNIT/ML IJ SOLN
0.0000 [IU] | Freq: Three times a day (TID) | INTRAMUSCULAR | Status: DC
  Administered 2021-01-05: 15 [IU] via SUBCUTANEOUS
  Administered 2021-01-06: 3 [IU] via SUBCUTANEOUS

## 2021-01-05 MED ORDER — INSULIN ASPART 100 UNIT/ML IJ SOLN: 0.0000 [IU] | Freq: Every day | INTRAMUSCULAR | Status: DC

## 2021-01-05 MED ORDER — CEPHALEXIN 500 MG PO CAPS: 500.0000 mg | ORAL_CAPSULE | Freq: Two times a day (BID) | ORAL | 0 refills | Status: AC

## 2021-01-05 NOTE — Plan of Care (Signed)
  Problem: Health Behavior/Discharge Planning: Goal: Ability to manage health-related needs will improve Outcome: Progressing   Problem: Clinical Measurements: Goal: Ability to maintain clinical measurements within normal limits will improve Outcome: Progressing Goal: Will remain free from infection Outcome: Progressing Goal: Diagnostic test results will improve Outcome: Progressing Goal: Respiratory complications will improve Outcome: Progressing Goal: Cardiovascular complication will be avoided Outcome: Progressing   Problem: Activity: Goal: Risk for activity intolerance will decrease Outcome: Progressing   Problem: Nutrition: Goal: Adequate nutrition will be maintained Outcome: Progressing   Problem: Coping: Goal: Level of anxiety will decrease Outcome: Progressing   Problem: Elimination: Goal: Will not experience complications related to bowel motility Outcome: Progressing Goal: Will not experience complications related to urinary retention Outcome: Progressing   Problem: Pain Managment: Goal: General experience of comfort will improve Outcome: Progressing   Problem: Safety: Goal: Ability to remain free from injury will improve Outcome: Progressing   Problem: Skin Integrity: Goal: Risk for impaired skin integrity will decrease Outcome: Progressing   Problem: Education: Goal: Knowledge of General Education information will improve Description: Including pain rating scale, medication(s)/side effects and non-pharmacologic comfort measures Outcome: Not Progressing   

## 2021-01-05 NOTE — Care Management Important Message (Signed)
Important Message  Patient Details  Name: Wanda Jones MRN: [id number] Date of Birth: 07-Jul-1945   Medicare Important Message Given:  Yes     [name] [address] 01/05/2021, 3:25 PM

## 2021-01-05 NOTE — Discharge Summary (Addendum)
Physician Discharge Summary  Wanda Jones OFB:[id number] DOB: [birth year] DOA: 01/02/2021  PCP: Pcp, No  Admit date: 01/02/2021 Discharge date: 01/06/2021  Time spent: 60 minutes  Recommendations for Outpatient Follow-up:   Patient will be discharged on Keflex 500 mg p.o. twice daily for 5 more days  Discharge Diagnoses:  Principal Problem:   Sepsis ([place]) Active Problems:   UTI (urinary tract infection)   Cellulitis   Hyperglycemia   AKI (acute kidney injury) ([place])   Type 1 diabetes ([place])   Discharge Condition: Stable  Diet recommendation: Heart healthy diet  Filed Weights   01/02/21 1643  Weight: 59 kg    History of present illness:  75 year old female history of three-vessel CAD, type 1 diabetes mellitus, hypertension, hyperlipidemia, depression, hypothyroidism, overactive bladder, dementia, history of breast cancer presented to ED with complaints of malaise, flank pain, suprapubic abdominal pain, dysuria, rigors and hyperglycemia. In the ED she was found to have abnormal UA and also possible cellulitis of lower extremities  Hospital Course:   Sepsis -Secondary to UTI/cellulitis -Presented with tachycardia, tachypnea, fever(T-max one 101.7) -Also found to have abnormal UA and possible cellulitis -Sepsis physiology has resolved -Started on ceftriaxone; r -Urine culture growing E. coli and Proteus mirabilis sensitive to cefazolin -We will discharge on Keflex 500 mg p.o. twice daily for 5 more days  Poorly controlled diabetes mellitus type 1/hyperglycemia -Hemoglobin A1c was 10.9 in April 2022 -Presented with elevated blood glucose -Home dose of Tresiba increased to 20 units -Continue home dose of NovoLog 5 units 3 times daily with meals  Mild AKI -Likely from dehydration and sepsis as above -Received fluid boluses for sepsis as above -Resolved   CAD -Not on beta-blocker -Continue Crestor    Dementia -Continue Namenda, Aricept -No behavior disturbance    Depression -Continue Effexor   Hypothyroidism -Continue Synthroid   Overactive bladder -Continue Myrbetriq  Procedures:   Consultations:   Discharge Exam: Vitals:   01/05/21 0848 01/05/21 1225  BP: 132/66 (!) 115/57  Pulse:  90  Resp:  (!) 21  Temp:  98.4 F (36.9 C)  SpO2:  98%    General: Appears in no acute distress Cardiovascular: S1-S2, regular, no murmur auscultated Respiratory: Clear to auscultation bilaterally  Discharge Instructions   Discharge Instructions     Diet - low sodium heart healthy   Complete by: As directed    Increase activity slowly   Complete by: As directed       Allergies as of 01/05/2021       Reactions   Codeine Anaphylaxis   Prednisone Anaphylaxis        Medication List     TAKE these medications    acetaminophen 325 MG tablet Commonly known as: TYLENOL Take 325 mg by mouth every 6 (six) hours as needed for moderate pain or headache.   cephALEXin 500 MG capsule Commonly known as: KEFLEX Take 1 capsule (500 mg total) by mouth 2 (two) times daily for 5 days.   cetirizine 10 MG tablet Commonly known as: ZYRTEC Take 10 mg by mouth daily.   clobetasol 0.05 % external solution Commonly known as: TEMOVATE Apply 1 application topically 2 (two) times daily as needed (irritation).   donepezil 5 MG tablet Commonly known as: ARICEPT Take 5 mg by mouth at bedtime.   levothyroxine 88 MCG tablet Commonly known as: SYNTHROID Take 88 mcg by mouth daily before breakfast.   memantine 5 MG tablet Commonly known as: NAMENDA Take 5 mg by mouth 2 (two) times  daily.   Myrbetriq 50 MG Tb24 tablet Generic drug: mirabegron ER Take 50 mg by mouth daily.   NOVOLOG FLEXPEN [place] Inject 5 Units into the skin with breakfast, with lunch, and with evening meal.   rosuvastatin 5 MG tablet Commonly known as: CRESTOR Take 5 mg by mouth at bedtime.   [name] FlexTouch 100 UNIT/ML FlexTouch Pen Generic drug: insulin degludec Inject  20 Units into the skin daily. What changed: how much to take   venlafaxine XR 150 MG 24 hr capsule Commonly known as: EFFEXOR-XR Take 150 mg by mouth daily with breakfast.   vitamin B-12 1000 MCG tablet Commonly known as: CYANOCOBALAMIN Take 1,000 mcg by mouth daily.       Allergies  Allergen Reactions   Codeine Anaphylaxis   Prednisone Anaphylaxis      The results of significant diagnostics from this hospitalization (including imaging, microbiology, ancillary and laboratory) are listed below for reference.    Significant Diagnostic Studies: No results found.  Microbiology: Recent Results (from the past 240 hour(s))  Resp Panel by RT-PCR (Flu A&B, Covid) Nasopharyngeal Swab     Status: None   Collection Time: 01/02/21  7:55 PM   Specimen: Nasopharyngeal Swab; Nasopharyngeal(NP) swabs in vial transport medium  Result Value Ref Range Status   SARS Coronavirus 2 by RT PCR NEGATIVE NEGATIVE Final    Comment: (NOTE) SARS-CoV-2 target nucleic acids are NOT DETECTED.  The SARS-CoV-2 RNA is generally detectable in upper respiratory specimens during the acute phase of infection. The lowest concentration of SARS-CoV-2 viral copies this assay can detect is 138 copies/mL. A negative result does not preclude SARS-Cov-2 infection and should not be used as the sole basis for treatment or other patient management decisions. A negative result may occur with  improper specimen collection/handling, submission of specimen other than nasopharyngeal swab, presence of viral mutation(s) within the areas targeted by this assay, and inadequate number of viral copies(<138 copies/mL). A negative result must be combined with clinical observations, patient history, and epidemiological information. The expected result is Negative.  Fact Sheet for Patients:  EntrepreneurPulse.com.au  Fact Sheet for Healthcare Providers:  IncredibleEmployment.be  This test is  no t yet approved or cleared by the [place] FDA and  has been authorized for detection and/or diagnosis of SARS-CoV-2 by FDA under an Emergency Use Authorization (EUA). This EUA will remain  in effect (meaning this test can be used) for the duration of the COVID-19 declaration under Section 564(b)(1) of the Act, 21 U.S.C.section 360bbb-3(b)(1), unless the authorization is terminated  or revoked sooner.       Influenza A by PCR NEGATIVE NEGATIVE Final   Influenza B by PCR NEGATIVE NEGATIVE Final    Comment: (NOTE) The Xpert Xpress SARS-CoV-2/FLU/RSV plus assay is intended as an aid in the diagnosis of influenza from Nasopharyngeal swab specimens and should not be used as a sole basis for treatment. Nasal washings and aspirates are unacceptable for Xpert Xpress SARS-CoV-2/FLU/RSV testing.  Fact Sheet for Patients: EntrepreneurPulse.com.au  Fact Sheet for Healthcare Providers: IncredibleEmployment.be  This test is not yet approved or cleared by the [place] FDA and has been authorized for detection and/or diagnosis of SARS-CoV-2 by FDA under an Emergency Use Authorization (EUA). This EUA will remain in effect (meaning this test can be used) for the duration of the COVID-19 declaration under Section 564(b)(1) of the Act, 21 U.S.C. section 360bbb-3(b)(1), unless the authorization is terminated or revoked.  Performed at [facility], [place] [address],  [place], [place] [address]   Urine Culture     Status: Abnormal   Collection Time: 01/02/21  9:29 PM   Specimen: Urine, Clean Catch  Result Value Ref Range Status   Specimen Description URINE, CLEAN CATCH  Final   Special Requests   Final    NONE Performed at [facility], [place] [address] [place], [place] [address]    Culture (A)  Final    50,000 COLONIES/mL ESCHERICHIA COLI 10,000 COLONIES/mL PROTEUS MIRABILIS    Report Status 01/05/2021 FINAL  Final   Organism ID,  Bacteria ESCHERICHIA COLI (A)  Final   Organism ID, Bacteria PROTEUS MIRABILIS (A)  Final      Susceptibility   Escherichia coli - MIC*    AMPICILLIN 4 SENSITIVE Sensitive     CEFAZOLIN <=4 SENSITIVE Sensitive     CEFEPIME <=0.12 SENSITIVE Sensitive     CEFTRIAXONE <=0.25 SENSITIVE Sensitive     CIPROFLOXACIN <=0.25 SENSITIVE Sensitive     GENTAMICIN <=1 SENSITIVE Sensitive     IMIPENEM <=0.25 SENSITIVE Sensitive     NITROFURANTOIN <=16 SENSITIVE Sensitive     TRIMETH/SULFA <=20 SENSITIVE Sensitive     AMPICILLIN/SULBACTAM <=2 SENSITIVE Sensitive     PIP/TAZO <=4 SENSITIVE Sensitive     * 50,000 COLONIES/mL ESCHERICHIA COLI   Proteus mirabilis - MIC*    AMPICILLIN <=2 SENSITIVE Sensitive     CEFAZOLIN <=4 SENSITIVE Sensitive     CEFEPIME <=0.12 SENSITIVE Sensitive     CEFTRIAXONE <=0.25 SENSITIVE Sensitive     CIPROFLOXACIN <=0.25 SENSITIVE Sensitive     GENTAMICIN <=1 SENSITIVE Sensitive     IMIPENEM 2 SENSITIVE Sensitive     NITROFURANTOIN RESISTANT Resistant     TRIMETH/SULFA <=20 SENSITIVE Sensitive     AMPICILLIN/SULBACTAM <=2 SENSITIVE Sensitive     PIP/TAZO <=4 SENSITIVE Sensitive     * 10,000 COLONIES/mL PROTEUS MIRABILIS  Blood culture (routine x 2)     Status: None (Preliminary result)   Collection Time: 01/03/21 12:22 AM   Specimen: BLOOD  Result Value Ref Range Status   Specimen Description BLOOD SITE NOT SPECIFIED  Final   Special Requests   Final    BOTTLES DRAWN AEROBIC AND ANAEROBIC Blood Culture results may not be optimal due to an inadequate volume of blood received in culture bottles   Culture   Final    NO GROWTH 2 DAYS Performed at [facility], [address] [place], [place] [address]    Report Status PENDING  Incomplete  Blood culture (routine x 2)     Status: None (Preliminary result)   Collection Time: 01/03/21 12:28 AM   Specimen: BLOOD  Result Value Ref Range Status   Specimen Description BLOOD SITE NOT SPECIFIED  Final   Special  Requests   Final    BOTTLES DRAWN AEROBIC AND ANAEROBIC Blood Culture results may not be optimal due to an inadequate volume of blood received in culture bottles   Culture   Final    NO GROWTH 2 DAYS Performed at [facility], [place] [address], [place], [place] [address]    Report Status PENDING  Incomplete     Labs: Basic Metabolic Panel: Recent Labs  Lab 01/02/21 1647 01/04/21 0534 01/05/21 0122  NA 135 134* 136  K 4.5 3.0* 4.4  CL 99 99 103  CO2 24 24 23   GLUCOSE 438* 187* 313*  BUN 26* 12 14  CREATININE 1.27* 0.96 1.14*  CALCIUM 8.6* 8.1* 8.6*  Liver Function Tests: Recent Labs  Lab 01/02/21 1847 01/04/21 0534  AST 30 15  ALT 16 11  ALKPHOS 91 68  BILITOT 1.0 0.6  PROT 7.3 5.4*  ALBUMIN 3.3* 2.3*   Recent Labs  Lab 01/02/21 1847  LIPASE 52*   No results for input(s): AMMONIA in the last 168 hours. CBC: Recent Labs  Lab 01/02/21 1647 01/04/21 0534  WBC 13.1* 6.2  HGB 12.0 10.7*  HCT 37.3 33.1*  MCV 89.2 87.6  PLT 269 215   Cardiac Enzymes: No results for input(s): CKTOTAL, CKMB, CKMBINDEX, TROPONINI in the last 168 hours. BNP: BNP (last 3 results) Recent Labs    09/29/20 1833  BNP 100.8*    ProBNP (last 3 results) No results for input(s): PROBNP in the last 8760 hours.  CBG: Recent Labs  Lab 01/04/21 1132 01/04/21 1535 01/04/21 1938 01/05/21 0804 01/05/21 1216  GLUCAP 198* 198* 332* 212* 309*       Signed:  Oswald Hillock MD.  [facility] 01/05/2021, 12:28 PM

## 2021-01-05 NOTE — Evaluation (Signed)
Physical Therapy Evaluation Patient Details Name: Wanda Jones MRN: [id number] DOB: 03/05/1946 Today's Date: 01/05/2021  History of Present Illness  Pt is a 75 y.o. F who presents with malaise, flank pain, suprpubic abdominal pain, dysuria, rigors and hypergylcemia. Admitted with sepsis secondary to UTI/cellulitis. Significant PMH: three vessel CAD, type 1 diabetes mellitus, HTN, dementia, breast CA.  Clinical Impression  PTA, pt lives at [facility], uses a Rollator for mobility and is independent with ADL's. Per chart review, pt has a history of dementia and on PT evaluation, she is not oriented to place, stating she is in the "emporium." Pt presents with decreased functional mobility secondary to generalized weakness, balance deficits, decreased endurance, and decreased cognition. Pt requiring min assist for bed mobility/transfers and ambulating 100 feet with a walker at a min guard assist level. HR peak 121 bpm. Pt facility will need to be able to provide min assist for mobility in order to pt to discharge there. Will continue to follow acutely.      Recommendations for follow up therapy are one component of a multi-disciplinary discharge planning process, led by the attending physician.  Recommendations may be updated based on patient status, additional functional criteria and insurance authorization.  Follow Up Recommendations Home health PT;Supervision/Assistance - 24 hour (if [facility] can provide minA)    Equipment Recommendations  3in1 (PT)    Recommendations for Other Services       Precautions / Restrictions Precautions Precautions: Fall Restrictions Weight Bearing Restrictions: No      Mobility  Bed Mobility Overal bed mobility: Needs Assistance Bed Mobility: Supine to Sit;Sit to Supine     Supine to sit: Min assist Sit to supine: Min guard   General bed mobility comments: Pt requiring minA to pull trunk to upright, able to position self back to supine with  increased time/effort.    Transfers Overall transfer level: Needs assistance Equipment used: Rolling walker (2 wheeled) Transfers: Sit to/from Stand Sit to Stand: Min assist         General transfer comment: MinA to rise from edge of bed and toilet  Ambulation/Gait Ambulation/Gait assistance: Min guard Gait Distance (Feet): 100 Feet Assistive device: Rolling walker (2 wheeled) Gait Pattern/deviations: Step-through pattern;Decreased step length - right;Decreased stride length;Trunk flexed;Decreased dorsiflexion - right;Decreased dorsiflexion - left Gait velocity: decreased Gait velocity interpretation: <1.8 ft/sec, indicate of risk for recurrent falls General Gait Details: Pt with shortened R step length, increased foot external rotation, and decreased bilateral foot clearance. Cues for activity pacing; pt requiring one brief standing rest break  Stairs            Wheelchair Mobility    Modified Rankin (Stroke Patients Only)       Balance Overall balance assessment: Needs assistance Sitting-balance support: Feet supported Sitting balance-Leahy Scale: Fair     Standing balance support: Bilateral upper extremity supported Standing balance-Leahy Scale: Poor Standing balance comment: reliant on RW                             Pertinent Vitals/Pain Pain Assessment: Faces Faces Pain Scale: Hurts a little bit Pain Location: back Pain Descriptors / Indicators: Grimacing;Guarding Pain Intervention(s): Monitored during session    Home Living Family/patient expects to be discharged to:: Private residence Living Arrangements: Alone Available Help at Discharge: Personal care attendant;Available PRN/intermittently Type of Home: Independent living facility         Home Equipment: Gilford Rile - 4 wheels;Shower seat  Prior Function Level of Independence: Independent with assistive device(s)         Comments: uses Rollator, denies history of falls,  walks to dining hall for lunch     Hand Dominance        Extremity/Trunk Assessment   Upper Extremity Assessment Upper Extremity Assessment: Generalized weakness    Lower Extremity Assessment Lower Extremity Assessment: Generalized weakness    Cervical / Trunk Assessment Cervical / Trunk Assessment: Kyphotic  Communication   Communication: No difficulties  Cognition Arousal/Alertness: Awake/alert Behavior During Therapy: WFL for tasks assessed/performed Overall Cognitive Status: History of cognitive impairments - at baseline                                 General Comments: History of dementia noted per chart. Pt not oriented to place, stating she was at the "emporium;" unable to recall after being re-oriented.      General Comments      Exercises     Assessment/Plan    PT Assessment Patient needs continued PT services  PT Problem List Decreased strength;Decreased activity tolerance;Decreased mobility;Decreased balance;Decreased safety awareness       PT Treatment Interventions DME instruction;Gait training;Functional mobility training;Therapeutic activities;Therapeutic exercise;Balance training;Patient/family education    PT Goals (Current goals can be found in the Care Plan section)  Acute Rehab PT Goals Patient Stated Goal: walk to dining hall for lunch PT Goal Formulation: With patient Time For Goal Achievement: 01/19/21 Potential to Achieve Goals: Good    Frequency Min 3X/week   Barriers to discharge        Co-evaluation               AM-PAC PT "6 Clicks" Mobility  Outcome Measure Help needed turning from your back to your side while in a flat bed without using bedrails?: None Help needed moving from lying on your back to sitting on the side of a flat bed without using bedrails?: A Little Help needed moving to and from a bed to a chair (including a wheelchair)?: A Little Help needed standing up from a chair using your arms  (e.g., wheelchair or bedside chair)?: A Little Help needed to walk in hospital room?: A Little Help needed climbing 3-5 steps with a railing? : A Lot 6 Click Score: 18    End of Session Equipment Utilized During Treatment: Gait belt Activity Tolerance: Patient tolerated treatment well Patient left: in bed;with call bell/phone within reach;with bed alarm set Nurse Communication: Mobility status PT Visit Diagnosis: Unsteadiness on feet (R26.81);Muscle weakness (generalized) (M62.81);Difficulty in walking, not elsewhere classified (R26.2)    Time: 7062-3762 PT Time Calculation (min) (ACUTE ONLY): 30 min   Charges:   PT Evaluation $PT Eval Moderate Complexity: 1 Mod PT Treatments $Therapeutic Activity: 8-22 mins        Wyona Almas, PT, DPT Acute Rehabilitation Services Pager [phone] Office [phone]   Deno Etienne 01/05/2021, 2:16 PM

## 2021-01-05 NOTE — TOC Progression Note (Addendum)
Transition of Care [facility]) - Progression Note    Patient Details  Name: Wanda Jones MRN: [id number] Date of Birth: Sep 04, 1945  Transition of Care [facility]) CM/SW [place], RN Phone Number:[phone]  01/05/2021, 1:29 PM  Clinical Narrative:    CM has attempted to call [facility] to make them aware that patient is discharging today. Patient is from independent living and message has been left for Alto Denver who is the nurse. TOC is  following up to make sure facility has everything needed for patient to return to her independent living apartment. And to determine if this is the appropriate level of care for the patient.TOC will continue to follow.   1510 Patient has been evaluated by PT. PT is recommending 24 hour supervision and 3 in 1. CM has attempted to call [facility] again to discuss level of monitoring that patient will receive. Alto Denver is still not available. Message has been left. MD has been update and made aware that patient will most likely not be able to discharge today due to no established safe d/c plan.  1520 CM at bedside to update patient on PT recommendations. Patient verbalized understanding and gave CM permission to speak with her [name]. CM attempted to reach [place] with no answer. TOC will continue to follow.  1545 PT recommended 3in1, patient refuses stating she has a shower seat and walker and really doesn't want 3in1 right now.    Expected Discharge Plan: Home/Self Care Barriers to Discharge: Continued Medical Work up  Expected Discharge Plan and Services Expected Discharge Plan: Home/Self Care In-house Referral: NA Discharge Planning Services: CM Consult Post Acute Care Choice: NA Living arrangements for the past 2 months: [place] Expected Discharge Date: 01/05/21               DME Arranged: N/A DME Agency: NA       HH Arranged: NA HH Agency: NA         Social Determinants of Health (SDOH)  Interventions    Readmission Risk Interventions Readmission Risk Prevention Plan 01/04/2021  Post Dischage Appt Complete  Medication Screening Complete  Transportation Screening Complete

## 2021-01-06 DIAGNOSIS — R739 Hyperglycemia, unspecified: Secondary | ICD-10-CM | POA: Diagnosis not present

## 2021-01-06 DIAGNOSIS — A419 Sepsis, unspecified organism: Secondary | ICD-10-CM | POA: Diagnosis not present

## 2021-01-06 DIAGNOSIS — N179 Acute kidney failure, unspecified: Secondary | ICD-10-CM | POA: Diagnosis not present

## 2021-01-06 DIAGNOSIS — L03119 Cellulitis of unspecified part of limb: Secondary | ICD-10-CM | POA: Diagnosis not present

## 2021-01-06 LAB — GLUCOSE, CAPILLARY
Glucose-Capillary: 112 mg/dL — ABNORMAL HIGH (ref 70–99)
Glucose-Capillary: 171 mg/dL — ABNORMAL HIGH (ref 70–99)

## 2021-01-06 NOTE — TOC Progression Note (Addendum)
Transition of Care [facility]) - Progression Note    Patient Details  Name: Wanda Jones MRN: [id number] Date of Birth: 1945-11-10  Transition of Care [facility]) CM/SW [place], RN Phone [phone]  01/06/2021, 8:49 AM  Clinical Narrative:    CM attempted to contact Alto Denver @ [facility] to determine if patient will have appropriate level of supervision to return to facility. No answer message has been left and per the receptionist Denton Ar is the person I would need to speak with.   0900 CM called [facility] back to clarify that the facility does not have a CM or SW onsite for assistance with admissions. CM will have to await return call from [place]. T [facility]  0915 CM spoke with Briona who verified that patient has 24 hour supervision. Independent living has CNA's that round every 2 hours during wake hours and patient has access to CNA during other hours. Patient will receive [place] PT with [facility] which is in house agency for [place]. Patietn will have 24 hour supervision and can return. MD has been made aware. Will proceed with d/c planning.   Expected Discharge Plan: Home/Self Care Barriers to Discharge: Continued Medical Work up  Expected Discharge Plan and Services Expected Discharge Plan: Home/Self Care In-house Referral: NA Discharge Planning Services: CM Consult Post Acute Care Choice: NA Living arrangements for the past 2 months: [place] Expected Discharge Date: 01/05/21               DME Arranged: N/A DME Agency: NA       HH Arranged: NA HH Agency: NA         Social Determinants of Health (SDOH) Interventions    Readmission Risk Interventions Readmission Risk Prevention Plan 01/04/2021  Post Dischage Appt Complete  Medication Screening Complete  Transportation Screening Complete

## 2021-01-06 NOTE — TOC Transition Note (Addendum)
Transition of Care [facility]) - CM/SW Discharge Note   Patient Details  Name: Wanda Jones MRN: [id number] Date of Birth: 05-28-1945  Transition of Care [facility]) CM/SW Contact:  Angelita Ingles, RN Phone [phone] .  01/06/2021, 10:15 AM   Clinical Narrative:    CM spoke with [facility] and patiet is able to return . Attempted to notify son using both numbers listed on chart but no answer and no option for voicemail. Patient has been updated. PT orders have been faxed to [place] at [facility] who states she will speak with [facility] for patients HH. CM has also sent message to [place] at [place]. Orders have been faxed to [facility].Patient refused 3 in 1 stating that she doesn't need it. Per Briona nothing else is needed to transfer patient back to [facility]. Transportation has been set up with [facility].  1114 Patients son has arrived to unit and made aware of patients discharge back to [place]. Son states that he is able to transport patient. PTAR has been cancelled.     Final next level of care: Other (comment) (Independent Living) Barriers to Discharge: No Barriers Identified   Patient Goals and CMS Choice Patient states their goals for this hospitalization and ongoing recovery are:: Just wants to get back home   Choice offered to / list presented to : NA  Discharge Placement                Patient to be transferred to facility by: [place] Name of family member notified: called both numbers on chart unable to contact    Discharge Plan and Services In-house Referral: NA Discharge Planning Services: CM Consult Post Acute Care Choice: NA          DME Arranged: N/A (patient refused 3in1) DME Agency: NA       HH Arranged: PT [place] Agency: Other - See comment Secondary school teacher) Date HH Agency Contacted: 01/06/21 Time HH Agency Contacted:  (Briona from [facility])    Social Determinants of Health ([place]) Interventions     Readmission Risk  Interventions Readmission Risk Prevention Plan 01/04/2021  Post Dischage Appt Complete  Medication Screening Complete  Transportation Screening Complete

## 2021-01-06 NOTE — Progress Notes (Signed)
Patient is feeling better, no nausea or vomiting, no chest pain or dyspnea.   Her vital signs are stable, blood pressure 140/66, HR 88, rr 18 and oxygenation 94% on room air.  Lungs clear to auscultation Heart with S1 and S2 present, and rhythmic Abdomen is soft and non tender No lower extremity edema or apparent rash.  Plan:  Urine infection with sepsis, continue antibiotic therapy with cephalexin. Patient is medically stable for discharge.

## 2021-01-06 NOTE — Plan of Care (Signed)

## 2021-01-07 DIAGNOSIS — R2681 Unsteadiness on feet: Secondary | ICD-10-CM | POA: Diagnosis not present

## 2021-01-07 DIAGNOSIS — H5410 Blindness, one eye, low vision other eye, unspecified eyes: Secondary | ICD-10-CM | POA: Diagnosis not present

## 2021-01-07 DIAGNOSIS — N3946 Mixed incontinence: Secondary | ICD-10-CM | POA: Diagnosis not present

## 2021-01-07 DIAGNOSIS — R278 Other lack of coordination: Secondary | ICD-10-CM | POA: Diagnosis not present

## 2021-01-07 DIAGNOSIS — M6281 Muscle weakness (generalized): Secondary | ICD-10-CM | POA: Diagnosis not present

## 2021-01-07 DIAGNOSIS — R41841 Cognitive communication deficit: Secondary | ICD-10-CM | POA: Diagnosis not present

## 2021-01-07 DIAGNOSIS — R488 Other symbolic dysfunctions: Secondary | ICD-10-CM | POA: Diagnosis not present

## 2021-01-08 DIAGNOSIS — H5410 Blindness, one eye, low vision other eye, unspecified eyes: Secondary | ICD-10-CM | POA: Diagnosis not present

## 2021-01-08 DIAGNOSIS — R41841 Cognitive communication deficit: Secondary | ICD-10-CM | POA: Diagnosis not present

## 2021-01-08 DIAGNOSIS — R488 Other symbolic dysfunctions: Secondary | ICD-10-CM | POA: Diagnosis not present

## 2021-01-08 DIAGNOSIS — R2681 Unsteadiness on feet: Secondary | ICD-10-CM | POA: Diagnosis not present

## 2021-01-08 DIAGNOSIS — R278 Other lack of coordination: Secondary | ICD-10-CM | POA: Diagnosis not present

## 2021-01-08 DIAGNOSIS — M6281 Muscle weakness (generalized): Secondary | ICD-10-CM | POA: Diagnosis not present

## 2021-01-08 DIAGNOSIS — N3946 Mixed incontinence: Secondary | ICD-10-CM | POA: Diagnosis not present

## 2021-01-11 DIAGNOSIS — M6281 Muscle weakness (generalized): Secondary | ICD-10-CM | POA: Diagnosis not present

## 2021-01-11 DIAGNOSIS — R41841 Cognitive communication deficit: Secondary | ICD-10-CM | POA: Diagnosis not present

## 2021-01-11 DIAGNOSIS — R488 Other symbolic dysfunctions: Secondary | ICD-10-CM | POA: Diagnosis not present

## 2021-01-11 DIAGNOSIS — N3946 Mixed incontinence: Secondary | ICD-10-CM | POA: Diagnosis not present

## 2021-01-11 DIAGNOSIS — R2681 Unsteadiness on feet: Secondary | ICD-10-CM | POA: Diagnosis not present

## 2021-01-11 DIAGNOSIS — Z20828 Contact with and (suspected) exposure to other viral communicable diseases: Secondary | ICD-10-CM | POA: Diagnosis not present

## 2021-01-11 DIAGNOSIS — H5410 Blindness, one eye, low vision other eye, unspecified eyes: Secondary | ICD-10-CM | POA: Diagnosis not present

## 2021-01-11 DIAGNOSIS — R278 Other lack of coordination: Secondary | ICD-10-CM | POA: Diagnosis not present

## 2021-01-11 LAB — CULTURE, BLOOD (ROUTINE X 2)
Culture: NO GROWTH
Culture: NO GROWTH

## 2021-01-12 DIAGNOSIS — N3946 Mixed incontinence: Secondary | ICD-10-CM | POA: Diagnosis not present

## 2021-01-12 DIAGNOSIS — R488 Other symbolic dysfunctions: Secondary | ICD-10-CM | POA: Diagnosis not present

## 2021-01-12 DIAGNOSIS — R2681 Unsteadiness on feet: Secondary | ICD-10-CM | POA: Diagnosis not present

## 2021-01-12 DIAGNOSIS — M6281 Muscle weakness (generalized): Secondary | ICD-10-CM | POA: Diagnosis not present

## 2021-01-12 DIAGNOSIS — R41841 Cognitive communication deficit: Secondary | ICD-10-CM | POA: Diagnosis not present

## 2021-01-12 DIAGNOSIS — R278 Other lack of coordination: Secondary | ICD-10-CM | POA: Diagnosis not present

## 2021-01-12 DIAGNOSIS — H5410 Blindness, one eye, low vision other eye, unspecified eyes: Secondary | ICD-10-CM | POA: Diagnosis not present

## 2021-01-13 DIAGNOSIS — R278 Other lack of coordination: Secondary | ICD-10-CM | POA: Diagnosis not present

## 2021-01-13 DIAGNOSIS — R488 Other symbolic dysfunctions: Secondary | ICD-10-CM | POA: Diagnosis not present

## 2021-01-13 DIAGNOSIS — R2681 Unsteadiness on feet: Secondary | ICD-10-CM | POA: Diagnosis not present

## 2021-01-13 DIAGNOSIS — H5410 Blindness, one eye, low vision other eye, unspecified eyes: Secondary | ICD-10-CM | POA: Diagnosis not present

## 2021-01-13 DIAGNOSIS — M6281 Muscle weakness (generalized): Secondary | ICD-10-CM | POA: Diagnosis not present

## 2021-01-13 DIAGNOSIS — R41841 Cognitive communication deficit: Secondary | ICD-10-CM | POA: Diagnosis not present

## 2021-01-13 DIAGNOSIS — N3946 Mixed incontinence: Secondary | ICD-10-CM | POA: Diagnosis not present

## 2021-01-15 DIAGNOSIS — H5410 Blindness, one eye, low vision other eye, unspecified eyes: Secondary | ICD-10-CM | POA: Diagnosis not present

## 2021-01-15 DIAGNOSIS — R278 Other lack of coordination: Secondary | ICD-10-CM | POA: Diagnosis not present

## 2021-01-15 DIAGNOSIS — M6281 Muscle weakness (generalized): Secondary | ICD-10-CM | POA: Diagnosis not present

## 2021-01-15 DIAGNOSIS — R41841 Cognitive communication deficit: Secondary | ICD-10-CM | POA: Diagnosis not present

## 2021-01-15 DIAGNOSIS — R488 Other symbolic dysfunctions: Secondary | ICD-10-CM | POA: Diagnosis not present

## 2021-01-15 DIAGNOSIS — R2681 Unsteadiness on feet: Secondary | ICD-10-CM | POA: Diagnosis not present

## 2021-01-15 DIAGNOSIS — N3946 Mixed incontinence: Secondary | ICD-10-CM | POA: Diagnosis not present

## 2021-01-19 DIAGNOSIS — R41841 Cognitive communication deficit: Secondary | ICD-10-CM | POA: Diagnosis not present

## 2021-01-19 DIAGNOSIS — R2681 Unsteadiness on feet: Secondary | ICD-10-CM | POA: Diagnosis not present

## 2021-01-19 DIAGNOSIS — N3946 Mixed incontinence: Secondary | ICD-10-CM | POA: Diagnosis not present

## 2021-01-19 DIAGNOSIS — M6281 Muscle weakness (generalized): Secondary | ICD-10-CM | POA: Diagnosis not present

## 2021-01-19 DIAGNOSIS — H5410 Blindness, one eye, low vision other eye, unspecified eyes: Secondary | ICD-10-CM | POA: Diagnosis not present

## 2021-01-19 DIAGNOSIS — R488 Other symbolic dysfunctions: Secondary | ICD-10-CM | POA: Diagnosis not present

## 2021-01-19 DIAGNOSIS — R278 Other lack of coordination: Secondary | ICD-10-CM | POA: Diagnosis not present

## 2021-01-20 DIAGNOSIS — H5410 Blindness, one eye, low vision other eye, unspecified eyes: Secondary | ICD-10-CM | POA: Diagnosis not present

## 2021-01-20 DIAGNOSIS — M6281 Muscle weakness (generalized): Secondary | ICD-10-CM | POA: Diagnosis not present

## 2021-01-20 DIAGNOSIS — R2681 Unsteadiness on feet: Secondary | ICD-10-CM | POA: Diagnosis not present

## 2021-01-20 DIAGNOSIS — R278 Other lack of coordination: Secondary | ICD-10-CM | POA: Diagnosis not present

## 2021-01-20 DIAGNOSIS — N3946 Mixed incontinence: Secondary | ICD-10-CM | POA: Diagnosis not present

## 2021-01-20 DIAGNOSIS — R488 Other symbolic dysfunctions: Secondary | ICD-10-CM | POA: Diagnosis not present

## 2021-01-20 DIAGNOSIS — R41841 Cognitive communication deficit: Secondary | ICD-10-CM | POA: Diagnosis not present

## 2021-01-21 DIAGNOSIS — R41841 Cognitive communication deficit: Secondary | ICD-10-CM | POA: Diagnosis not present

## 2021-01-21 DIAGNOSIS — R488 Other symbolic dysfunctions: Secondary | ICD-10-CM | POA: Diagnosis not present

## 2021-01-21 DIAGNOSIS — H5410 Blindness, one eye, low vision other eye, unspecified eyes: Secondary | ICD-10-CM | POA: Diagnosis not present

## 2021-01-21 DIAGNOSIS — R278 Other lack of coordination: Secondary | ICD-10-CM | POA: Diagnosis not present

## 2021-01-21 DIAGNOSIS — M6281 Muscle weakness (generalized): Secondary | ICD-10-CM | POA: Diagnosis not present

## 2021-01-21 DIAGNOSIS — R2681 Unsteadiness on feet: Secondary | ICD-10-CM | POA: Diagnosis not present

## 2021-01-21 DIAGNOSIS — N3946 Mixed incontinence: Secondary | ICD-10-CM | POA: Diagnosis not present

## 2021-01-22 DIAGNOSIS — R41841 Cognitive communication deficit: Secondary | ICD-10-CM | POA: Diagnosis not present

## 2021-01-22 DIAGNOSIS — H5410 Blindness, one eye, low vision other eye, unspecified eyes: Secondary | ICD-10-CM | POA: Diagnosis not present

## 2021-01-22 DIAGNOSIS — N3946 Mixed incontinence: Secondary | ICD-10-CM | POA: Diagnosis not present

## 2021-01-22 DIAGNOSIS — R488 Other symbolic dysfunctions: Secondary | ICD-10-CM | POA: Diagnosis not present

## 2021-01-22 DIAGNOSIS — R278 Other lack of coordination: Secondary | ICD-10-CM | POA: Diagnosis not present

## 2021-01-22 DIAGNOSIS — M6281 Muscle weakness (generalized): Secondary | ICD-10-CM | POA: Diagnosis not present

## 2021-01-22 DIAGNOSIS — R2681 Unsteadiness on feet: Secondary | ICD-10-CM | POA: Diagnosis not present

## 2021-01-24 ENCOUNTER — Encounter (HOSPITAL_COMMUNITY): Payer: Self-pay | Admitting: Emergency Medicine

## 2021-01-24 ENCOUNTER — Other Ambulatory Visit: Payer: Self-pay

## 2021-01-24 ENCOUNTER — Emergency Department (HOSPITAL_COMMUNITY): Payer: Medicare HMO

## 2021-01-24 ENCOUNTER — Inpatient Hospital Stay (HOSPITAL_COMMUNITY)
Admission: EM | Admit: 2021-01-24 | Discharge: 2021-01-28 | DRG: 871 | Disposition: A | Discharge status: Home Health | Payer: Medicare HMO | Attending: Internal Medicine | Admitting: Internal Medicine

## 2021-01-24 DIAGNOSIS — N3000 Acute cystitis without hematuria: Secondary | ICD-10-CM | POA: Diagnosis not present

## 2021-01-24 DIAGNOSIS — I959 Hypotension, unspecified: Secondary | ICD-10-CM | POA: Diagnosis not present

## 2021-01-24 DIAGNOSIS — E869 Volume depletion, unspecified: Secondary | ICD-10-CM | POA: Diagnosis present

## 2021-01-24 DIAGNOSIS — Z794 Long term (current) use of insulin: Secondary | ICD-10-CM

## 2021-01-24 DIAGNOSIS — Z87892 Personal history of anaphylaxis: Secondary | ICD-10-CM

## 2021-01-24 DIAGNOSIS — Z888 Allergy status to other drugs, medicaments and biological substances status: Secondary | ICD-10-CM

## 2021-01-24 DIAGNOSIS — B964 Proteus (mirabilis) (morganii) as the cause of diseases classified elsewhere: Secondary | ICD-10-CM | POA: Diagnosis present

## 2021-01-24 DIAGNOSIS — E876 Hypokalemia: Secondary | ICD-10-CM | POA: Diagnosis present

## 2021-01-24 DIAGNOSIS — Z20822 Contact with and (suspected) exposure to covid-19: Secondary | ICD-10-CM | POA: Diagnosis present

## 2021-01-24 DIAGNOSIS — G9340 Encephalopathy, unspecified: Secondary | ICD-10-CM | POA: Diagnosis present

## 2021-01-24 DIAGNOSIS — R32 Unspecified urinary incontinence: Secondary | ICD-10-CM | POA: Diagnosis present

## 2021-01-24 DIAGNOSIS — A4159 Other Gram-negative sepsis: Secondary | ICD-10-CM | POA: Diagnosis not present

## 2021-01-24 DIAGNOSIS — Z87440 Personal history of urinary (tract) infections: Secondary | ICD-10-CM | POA: Diagnosis not present

## 2021-01-24 DIAGNOSIS — Z79890 Hormone replacement therapy (postmenopausal): Secondary | ICD-10-CM | POA: Diagnosis not present

## 2021-01-24 DIAGNOSIS — E441 Mild protein-calorie malnutrition: Secondary | ICD-10-CM | POA: Diagnosis present

## 2021-01-24 DIAGNOSIS — N390 Urinary tract infection, site not specified: Secondary | ICD-10-CM

## 2021-01-24 DIAGNOSIS — E109 Type 1 diabetes mellitus without complications: Secondary | ICD-10-CM | POA: Diagnosis present

## 2021-01-24 DIAGNOSIS — F0390 Unspecified dementia without behavioral disturbance: Secondary | ICD-10-CM

## 2021-01-24 DIAGNOSIS — E785 Hyperlipidemia, unspecified: Secondary | ICD-10-CM | POA: Diagnosis present

## 2021-01-24 DIAGNOSIS — Z885 Allergy status to narcotic agent status: Secondary | ICD-10-CM

## 2021-01-24 DIAGNOSIS — Z6823 Body mass index (BMI) 23.0-23.9, adult: Secondary | ICD-10-CM | POA: Diagnosis not present

## 2021-01-24 DIAGNOSIS — A419 Sepsis, unspecified organism: Secondary | ICD-10-CM | POA: Diagnosis not present

## 2021-01-24 DIAGNOSIS — D649 Anemia, unspecified: Secondary | ICD-10-CM | POA: Diagnosis present

## 2021-01-24 DIAGNOSIS — R0689 Other abnormalities of breathing: Secondary | ICD-10-CM | POA: Diagnosis not present

## 2021-01-24 DIAGNOSIS — G9341 Metabolic encephalopathy: Secondary | ICD-10-CM | POA: Diagnosis present

## 2021-01-24 DIAGNOSIS — Z0389 Encounter for observation for other suspected diseases and conditions ruled out: Secondary | ICD-10-CM | POA: Diagnosis not present

## 2021-01-24 DIAGNOSIS — R509 Fever, unspecified: Secondary | ICD-10-CM | POA: Diagnosis not present

## 2021-01-24 DIAGNOSIS — Z79899 Other long term (current) drug therapy: Secondary | ICD-10-CM | POA: Diagnosis not present

## 2021-01-24 DIAGNOSIS — E119 Type 2 diabetes mellitus without complications: Secondary | ICD-10-CM | POA: Diagnosis present

## 2021-01-24 DIAGNOSIS — M958 Other specified acquired deformities of musculoskeletal system: Secondary | ICD-10-CM | POA: Diagnosis not present

## 2021-01-24 DIAGNOSIS — R0902 Hypoxemia: Secondary | ICD-10-CM | POA: Diagnosis not present

## 2021-01-24 HISTORY — DX: Unspecified dementia, unspecified severity, without behavioral disturbance, psychotic disturbance, mood disturbance, and anxiety: F03.90

## 2021-01-24 HISTORY — DX: Type 1 diabetes mellitus without complications: E10.9

## 2021-01-24 HISTORY — DX: Personal history of urinary (tract) infections: Z87.440

## 2021-01-24 HISTORY — DX: Hyperlipidemia, unspecified: E78.5

## 2021-01-24 HISTORY — DX: Volume depletion, unspecified: E86.9

## 2021-01-24 LAB — CBC WITH DIFFERENTIAL/PLATELET
Abs Immature Granulocytes: 0.1 10*3/uL — ABNORMAL HIGH (ref 0.00–0.07)
Basophils Absolute: 0.1 10*3/uL (ref 0.0–0.1)
Basophils Relative: 0 %
Eosinophils Absolute: 0 10*3/uL (ref 0.0–0.5)
Eosinophils Relative: 0 %
HCT: 38.2 % (ref 36.0–46.0)
Hemoglobin: 12.2 g/dL (ref 12.0–15.0)
Immature Granulocytes: 1 %
Lymphocytes Relative: 6 %
Lymphs Abs: 0.9 10*3/uL (ref 0.7–4.0)
MCH: 28.6 pg (ref 26.0–34.0)
MCHC: 31.9 g/dL (ref 30.0–36.0)
MCV: 89.7 fL (ref 80.0–100.0)
Monocytes Absolute: 1.3 10*3/uL — ABNORMAL HIGH (ref 0.1–1.0)
Monocytes Relative: 8 %
Neutro Abs: 12.9 10*3/uL — ABNORMAL HIGH (ref 1.7–7.7)
Neutrophils Relative %: 85 %
Platelets: 397 10*3/uL (ref 150–400)
RBC: 4.26 MIL/uL (ref 3.87–5.11)
RDW: 12.8 % (ref 11.5–15.5)
WBC: 15.2 10*3/uL — ABNORMAL HIGH (ref 4.0–10.5)
nRBC: 0 % (ref 0.0–0.2)

## 2021-01-24 LAB — COMPREHENSIVE METABOLIC PANEL
ALT: 13 U/L (ref 0–44)
AST: 17 U/L (ref 15–41)
Albumin: 3.4 g/dL — ABNORMAL LOW (ref 3.5–5.0)
Alkaline Phosphatase: 91 U/L (ref 38–126)
Anion gap: 9 (ref 5–15)
BUN: 18 mg/dL (ref 8–23)
CO2: 28 mmol/L (ref 22–32)
Calcium: 9 mg/dL (ref 8.9–10.3)
Chloride: 101 mmol/L (ref 98–111)
Creatinine, Ser: 1.09 mg/dL — ABNORMAL HIGH (ref 0.44–1.00)
GFR, Estimated: 53 mL/min — ABNORMAL LOW (ref >60)
Glucose, Bld: 145 mg/dL — ABNORMAL HIGH (ref 70–99)
Potassium: 3.6 mmol/L (ref 3.5–5.1)
Sodium: 138 mmol/L (ref 135–145)
Total Bilirubin: 0.8 mg/dL (ref 0.3–1.2)
Total Protein: 7.3 g/dL (ref 6.5–8.1)

## 2021-01-24 LAB — URINALYSIS, ROUTINE W REFLEX MICROSCOPIC
Bilirubin Urine: NEGATIVE
Glucose, UA: 50 mg/dL — AB
Hgb urine dipstick: NEGATIVE
Ketones, ur: NEGATIVE mg/dL
Nitrite: NEGATIVE
Protein, ur: 100 mg/dL — AB
Specific Gravity, Urine: 1.013 (ref 1.005–1.030)
WBC, UA: >50 WBC/hpf — ABNORMAL HIGH (ref 0–5)
pH: 8 (ref 5.0–8.0)

## 2021-01-24 LAB — RESP PANEL BY RT-PCR (FLU A&B, COVID) ARPGX2
Influenza A by PCR: NEGATIVE
Influenza B by PCR: NEGATIVE
SARS Coronavirus 2 by RT PCR: NEGATIVE

## 2021-01-24 LAB — PROTIME-INR
INR: 1.1 (ref 0.8–1.2)
Prothrombin Time: 13.8 seconds (ref 11.4–15.2)

## 2021-01-24 LAB — CBG MONITORING, ED: Glucose-Capillary: 131 mg/dL — ABNORMAL HIGH (ref 70–99)

## 2021-01-24 LAB — LACTIC ACID, PLASMA: Lactic Acid, Venous: 1.5 mmol/L (ref 0.5–1.9)

## 2021-01-24 MED ORDER — ONDANSETRON HCL 4 MG/2ML IJ SOLN: 4.0000 mg | Freq: Four times a day (QID) | INTRAMUSCULAR | Status: DC | PRN

## 2021-01-24 MED ORDER — MEMANTINE HCL 5 MG PO TABS
5.0000 mg | ORAL_TABLET | Freq: Two times a day (BID) | ORAL | Status: DC
  Administered 2021-01-25 – 2021-01-28 (×6): 5 mg via ORAL
  Filled 2021-01-24 (×6): qty 1

## 2021-01-24 MED ORDER — ONDANSETRON HCL 4 MG PO TABS: 4.0000 mg | ORAL_TABLET | Freq: Four times a day (QID) | ORAL | Status: DC | PRN

## 2021-01-24 MED ORDER — SODIUM CHLORIDE 0.9 % IV BOLUS
500.0000 mL | Freq: Once | INTRAVENOUS | Status: AC
  Administered 2021-01-24: 500 mL via INTRAVENOUS

## 2021-01-24 MED ORDER — LEVOTHYROXINE SODIUM 88 MCG PO TABS
88.0000 ug | ORAL_TABLET | Freq: Every day | ORAL | Status: DC
  Administered 2021-01-26 – 2021-01-28 (×3): 88 ug via ORAL
  Filled 2021-01-24 (×3): qty 1

## 2021-01-24 MED ORDER — ENOXAPARIN SODIUM 40 MG/0.4ML IJ SOSY
40.0000 mg | PREFILLED_SYRINGE | INTRAMUSCULAR | Status: DC
  Administered 2021-01-25 – 2021-01-27 (×3): 40 mg via SUBCUTANEOUS
  Filled 2021-01-24 (×3): qty 0.4

## 2021-01-24 MED ORDER — INSULIN GLARGINE-YFGN 100 UNIT/ML ~~LOC~~ SOLN
20.0000 [IU] | Freq: Every day | SUBCUTANEOUS | Status: DC
  Administered 2021-01-26 – 2021-01-27 (×2): 20 [IU] via SUBCUTANEOUS
  Filled 2021-01-24 (×2): qty 0.2

## 2021-01-24 MED ORDER — DONEPEZIL HCL 5 MG PO TABS
5.0000 mg | ORAL_TABLET | Freq: Every day | ORAL | Status: DC
  Administered 2021-01-25 – 2021-01-27 (×3): 5 mg via ORAL
  Filled 2021-01-24 (×3): qty 1

## 2021-01-24 MED ORDER — ROSUVASTATIN CALCIUM 5 MG PO TABS
5.0000 mg | ORAL_TABLET | Freq: Every day | ORAL | Status: DC
  Administered 2021-01-25 – 2021-01-27 (×3): 5 mg via ORAL
  Filled 2021-01-24 (×3): qty 1

## 2021-01-24 MED ORDER — ACETAMINOPHEN 650 MG RE SUPP
650.0000 mg | Freq: Four times a day (QID) | RECTAL | Status: DC | PRN
  Administered 2021-01-25: 650 mg via RECTAL
  Filled 2021-01-24: qty 1

## 2021-01-24 MED ORDER — ACETAMINOPHEN 325 MG PO TABS: 650.0000 mg | ORAL_TABLET | Freq: Four times a day (QID) | ORAL | Status: DC | PRN

## 2021-01-24 MED ORDER — SODIUM CHLORIDE 0.9% IV SOLUTION: Freq: Once | INTRAVENOUS | Status: DC

## 2021-01-24 MED ORDER — VENLAFAXINE HCL ER 75 MG PO CP24
150.0000 mg | ORAL_CAPSULE | Freq: Every day | ORAL | Status: DC
  Administered 2021-01-26 – 2021-01-28 (×3): 150 mg via ORAL
  Filled 2021-01-24 (×3): qty 2

## 2021-01-24 MED ORDER — SODIUM CHLORIDE 0.9 % IV SOLN
2.0000 g | INTRAVENOUS | Status: DC
  Administered 2021-01-24 – 2021-01-25 (×2): 2 g via INTRAVENOUS
  Filled 2021-01-24 (×2): qty 20

## 2021-01-24 MED ORDER — INSULIN ASPART 100 UNIT/ML IJ SOLN
0.0000 [IU] | Freq: Three times a day (TID) | INTRAMUSCULAR | Status: DC
  Administered 2021-01-25: 1 [IU] via SUBCUTANEOUS
  Administered 2021-01-25 – 2021-01-26 (×2): 3 [IU] via SUBCUTANEOUS
  Administered 2021-01-26: 5 [IU] via SUBCUTANEOUS
  Administered 2021-01-26 – 2021-01-27 (×3): 3 [IU] via SUBCUTANEOUS
  Administered 2021-01-27: 7 [IU] via SUBCUTANEOUS
  Administered 2021-01-28: 3 [IU] via SUBCUTANEOUS
  Administered 2021-01-28: 7 [IU] via SUBCUTANEOUS
  Filled 2021-01-24: qty 0.09

## 2021-01-24 MED ORDER — LACTATED RINGERS IV SOLN
INTRAVENOUS | Status: AC
  Administered 2021-01-25: 150 mL/h via INTRAVENOUS

## 2021-01-24 MED ORDER — MIRABEGRON ER 25 MG PO TB24
50.0000 mg | ORAL_TABLET | Freq: Every day | ORAL | Status: DC
  Administered 2021-01-26 – 2021-01-28 (×3): 50 mg via ORAL
  Filled 2021-01-24 (×3): qty 2

## 2021-01-24 NOTE — ED Triage Notes (Signed)
BIB EMS from [place], complains of urinary symptoms, confusion and strong-smelling urine that the daughter noticed today. Not normally incontinent, noticed to be incontinent upon arrival. Had a fever w/ EMS, CNA gave 1,000 mg PO Tylenol. EMS gave 500 cc NS.

## 2021-01-24 NOTE — ED Provider Notes (Signed)
[facility] DEPT Provider Note   CSN: [id number] Arrival date & time: 01/24/21  1303     History No chief complaint on file.   Wanda Jones is a 75 y.o. female who presents with fever, tachycardia, tachypnea associated with several days of vaginal discomfort including itching.  The patient initially felt that she was here due to blood sugar issues.however with further questioning she states that she was recently hospitalized during which she had a urinary tract infection that was treated with antibiotics.  She says that at that time she did have increased frequency and dysuria however she believes the symptoms did improve.  At this time she denies dysuria and frequency but does endorse urinary incontinence at baseline.  She denies vaginal discharge.  She describes the feeling as "like a yeast infection. " She endorses fatigue with weakness fatigue with weakness but denies fever, chills.  She ambulates with a walker at baseline and states that she has continued to be able to do this however does note weakness.     Past Medical History:  Diagnosis Date   Diabetes mellitus type 1 ([place])    History of recurrent UTIs     Patient Active Problem List   Diagnosis Date Noted   Sepsis ([place]) 01/02/2021   UTI (urinary tract infection) 01/02/2021   Cellulitis 01/02/2021   Hyperglycemia 01/02/2021   AKI (acute kidney injury) ([place]) 01/02/2021   Type 1 diabetes ([place]) 01/02/2021    No past surgical history on file.   OB History   No obstetric history on file.     No family history on file.     Home Medications Prior to Admission medications   Medication Sig Start Date End Date Taking? Authorizing Provider  acetaminophen (TYLENOL) 325 MG tablet Take 325 mg by mouth every 6 (six) hours as needed for moderate pain or headache.    [provider]  cetirizine (ZYRTEC) 10 MG tablet Take 10 mg by mouth daily.    [provider]  clobetasol  (TEMOVATE) 0.05 % external solution Apply 1 application topically 2 (two) times daily as needed (irritation).    [provider]  donepezil (ARICEPT) 5 MG tablet Take 5 mg by mouth at bedtime.    [provider]  Insulin Aspart (NOVOLOG FLEXPEN [place]) Inject 5 Units into the skin with breakfast, with lunch, and with evening meal.    [provider]  insulin degludec (TRESIBA FLEXTOUCH) 100 UNIT/ML FlexTouch Pen Inject 20 Units into the skin daily. 01/05/21   Oswald Hillock, MD  levothyroxine (SYNTHROID) 88 MCG tablet Take 88 mcg by mouth daily before breakfast.    [provider]  memantine (NAMENDA) 5 MG tablet Take 5 mg by mouth 2 (two) times daily.    [provider]  MYRBETRIQ 50 MG TB24 tablet Take 50 mg by mouth daily. 11/27/20   [provider]  rosuvastatin (CRESTOR) 5 MG tablet Take 5 mg by mouth at bedtime.    [provider]  venlafaxine XR (EFFEXOR-XR) 150 MG 24 hr capsule Take 150 mg by mouth daily with breakfast.    [provider]  vitamin B-12 (CYANOCOBALAMIN) 1000 MCG tablet Take 1,000 mcg by mouth daily.    [provider]    Allergies    Codeine and Prednisone  Review of Systems   Review of Systems  Constitutional:  Positive for fatigue. Negative for chills and fever.  Respiratory:  Negative for chest tightness and shortness of breath.  Gastrointestinal:  Negative for abdominal pain, constipation and diarrhea.  Genitourinary:  Negative for dysuria, flank pain, frequency, urgency and vaginal discharge.       + vaginal itching  Musculoskeletal:  Negative for back pain.  Neurological:  Positive for weakness.  Psychiatric/Behavioral:  Negative for confusion.    Physical Exam Updated Vital Signs BP (!) 142/67   Pulse 97   Temp 100.3 F (37.9 C) (Rectal)   Resp (!) 21   Ht 5\' 3"  (1.6 m)   Wt 59 kg   SpO2 99%   BMI 23.04 kg/m   Constitutional: Frail, elderly female in no acute  distress. HENT: Mucous membranes are dry. Cardio: Tachycardic with regular rhythm.  No murmurs, rubs, gallops. Pulm: Clear to auscultation bilaterally. Abdomen: Soft, nontender, nondistended. GU: No CVA tenderness. MSK: Decreased bulk and tone.  Negative for lower extremity edema. Skin: Skin is warm and dry.  Decreased skin turgor noted.   Neuro: Patient is alert and oriented to self, location, year, month.  She does seem to be slightly confused.  No focal deficit noted. Psych: Normal mood and affect.  ED Results / Procedures / Treatments   Labs (all labs ordered are listed, but only abnormal results are displayed) Labs Reviewed  COMPREHENSIVE METABOLIC PANEL - Abnormal; Notable for the following components:      Result Value   Glucose, Bld 145 (*)    Creatinine, Ser 1.09 (*)    Albumin 3.4 (*)    GFR, Estimated 53 (*)    All other components within normal limits  CBC WITH DIFFERENTIAL/PLATELET - Abnormal; Notable for the following components:   WBC 15.2 (*)    Neutro Abs 12.9 (*)    Monocytes Absolute 1.3 (*)    Abs Immature Granulocytes 0.10 (*)    All other components within normal limits  URINALYSIS, ROUTINE W REFLEX MICROSCOPIC - Abnormal; Notable for the following components:   APPearance HAZY (*)    Glucose, UA 50 (*)    Protein, ur 100 (*)    Leukocytes,Ua LARGE (*)    WBC, UA >50 (*)    Bacteria, UA MANY (*)    All other components within normal limits  CBG MONITORING, ED - Abnormal; Notable for the following components:   Glucose-Capillary 131 (*)    All other components within normal limits  RESP PANEL BY RT-PCR (FLU A&B, COVID) ARPGX2  CULTURE, BLOOD (ROUTINE X 2)  CULTURE, BLOOD (ROUTINE X 2)  URINE CULTURE  LACTIC ACID, PLASMA  PROTIME-INR  LACTIC ACID, PLASMA    EKG None  Radiology DG Chest 2 View  Result Date: 01/24/2021 CLINICAL DATA:  Suspected sepsis EXAM: CHEST - 2 VIEW COMPARISON:  None. FINDINGS: The heart size and mediastinal contours are  within normal limits. Both lungs are clear. 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 Surgical clips are noted in the right axilla. Patient status post prior lower T-spine vertebral plasty. IMPRESSION: No active cardiopulmonary disease. Electronically Signed   By: Abelardo Diesel M.D.   On: 01/24/2021 14:34    Procedures None  Medications Ordered in ED Medications  lactated ringers infusion ( Intravenous New Bag/Given 01/24/21 1417)  cefTRIAXone (ROCEPHIN) 2 g in sodium chloride 0.9 % 100 mL IVPB (2 g Intravenous New Bag/Given 01/24/21 1420)  sodium chloride 0.9 % bolus 500 mL (500 mLs Intravenous New Bag/Given 01/24/21 1411)    ED Course  I have reviewed the triage vital signs and the nursing notes.  Pertinent labs & imaging results that  were available during my care of the patient were reviewed by me and considered in my medical decision making (see chart for details).    MDM Rules/Calculators/A&P                           Patient presents via EMS with confusion and several days of urinary symptoms including vaginal itching, urinary incontinence.  She denies dysuria and increased frequency.  Patient is SIRS positive on arrival with tachycardia, tachypnea.  Sepsis work-up initiated with possible source of infection from urinary tract which showed a leukocytosis of 15.2.  UA was concerning for infection with large leukocytes and many bacteria.  Patient was initiated on Rocephin 2 g on receipt of these results.  She did receive 1000 mg of Tylenol during triage and 0.5 L NS by EMS. Lactic acid was normal at 1.5.  Urine culture, blood culture pending.  Respiratory panel was negative.  Consult placed to hospitalist team for admission for recurrent symptomatic acute cystitis.  Final Clinical Impression(s) / ED Diagnoses Final diagnoses:  Acute cystitis without hematuria    Rx / DC Orders ED Discharge Orders     None        Farrel Gordon, DO 01/24/21 1559    Margette Fast,  MD 01/25/21 [phone]

## 2021-01-24 NOTE — H&P (Signed)
History and Physical    Andreka Stucki LGX:[id number] DOB: 1946/03/08 DOA: 01/24/2021  PCP: Pcp, No   Patient coming from: SNF.  I have personally briefly reviewed patient's old medical records in [place]  Chief Complaint: Confusion and urinary symptoms.  HPI: Wanda Jones is a 75 y.o. female with medical history significant of dementia, type II DM, recurring UTIs, hyperlipidemia who is brought via EMS from her independent living facility with complaints of urinary symptoms and confusion.  Her son stated that she develops the symptoms when she has a UTI.  She is somnolent and oriented to name only.  She is unable to provide further information.  ED Course: Initial vital signs were temperature 99.3 F, pulse 101, respirations 22, BP 111/55 mmHg O2 sat 96% on room air.  The patient received ceftriaxone and 500 mL of NS bolus in the emergency department.  Lab work: Urinalysis showed glucosuria more than 50 and proteinuria of 100 mg/dL she had large leukocyte esterase more than 50 WBC and many bacteria on microscopic examination.  CBC showed a white count of 15.2, hemoglobin 4.2 g/dL platelets 397.  Normal lactic acid, PT and INR.  CMP showed a glucose of 145 and creatinine of 1.09 mg/dL.  Her albumin was 3.4 g/dL.  Imaging: Her chest radiograph did not show any acute cardiopulmonary pathology.  Review of Systems: As per HPI otherwise all other systems reviewed and are negative.  Past Medical History:  Diagnosis Date   Dementia ([place]) 01/24/2021   Diabetes mellitus type 1 ([place])    History of recurrent UTIs    Hyperlipidemia 01/24/2021   History reviewed. No pertinent surgical history.  Social History  has no history on file for tobacco use, alcohol use, and drug use.  Allergies  Allergen Reactions   Codeine Anaphylaxis   Prednisone Anaphylaxis   Family History  Problem Relation Age of Onset   Hypertension Other    Prior to Admission medications   Medication Sig Start Date  End Date Taking? Authorizing Provider  acetaminophen (TYLENOL) 325 MG tablet Take 325 mg by mouth every 6 (six) hours as needed for moderate pain or headache.    [provider]  cetirizine (ZYRTEC) 10 MG tablet Take 10 mg by mouth daily.    [provider]  clobetasol (TEMOVATE) 0.05 % external solution Apply 1 application topically 2 (two) times daily as needed (irritation).    [provider]  donepezil (ARICEPT) 5 MG tablet Take 5 mg by mouth at bedtime.    [provider]  Insulin Aspart (NOVOLOG FLEXPEN [place]) Inject 5 Units into the skin with breakfast, with lunch, and with evening meal.    [provider]  insulin degludec (TRESIBA FLEXTOUCH) 100 UNIT/ML FlexTouch Pen Inject 20 Units into the skin daily. 01/05/21   Oswald Hillock, MD  levothyroxine (SYNTHROID) 88 MCG tablet Take 88 mcg by mouth daily before breakfast.    [provider]  memantine (NAMENDA) 5 MG tablet Take 5 mg by mouth 2 (two) times daily.    [provider]  MYRBETRIQ 50 MG TB24 tablet Take 50 mg by mouth daily. 11/27/20   [provider]  rosuvastatin (CRESTOR) 5 MG tablet Take 5 mg by mouth at bedtime.    [provider]  venlafaxine XR (EFFEXOR-XR) 150 MG 24 hr capsule Take 150 mg by mouth daily with breakfast.    [provider]  vitamin B-12 (CYANOCOBALAMIN) 1000 MCG tablet Take 1,000 mcg by mouth daily.  [provider]   Physical Exam: Vitals:   01/24/21 1415 01/24/21 1430 01/24/21 1530 01/24/21 1730  BP: 122/62 (!) 142/67 (!) 167/78 (!) 167/78  Pulse: 94 97 (!) 106 (!) 112  Resp: (!) 25 (!) 21 (!) 22 17  Temp:   98.8 F (37.1 C)   TempSrc:   Oral   SpO2: 99% 99% 98% 94%  Weight:      Height:       Constitutional: Looks acutely ill. Eyes: PERRL, lids and conjunctivae normal.  Injected sclera. ENMT: Mucous membranes are dry.  Posterior pharynx clear of any exudate or lesions. Neck: normal, supple, no masses,  no thyromegaly Respiratory: clear to auscultation bilaterally, no wheezing, no crackles. Normal respiratory effort. No accessory muscle use.  Cardiovascular: Tachycardic with a regular rhythm in the 110s, no murmurs / rubs / gallops. No extremity edema. 2+ pedal pulses. No carotid bruits.  Abdomen: No distention.  Bowel sounds positive.  Soft, no tenderness, no masses palpated. No hepatosplenomegaly. Musculoskeletal: Severe generalized weakness.  No clubbing / cyanosis.  Good ROM, no contractures. Normal muscle tone.  Skin: no acute rashes, lesions, ulcers on very limited dermatological examination. Neurologic: CN 2-12 grossly intact. Sensation intact, DTR normal. Strength seems symmetric. Psychiatric: Somnolent, responds to verbal stimuli.  Oriented to name only.   Labs on Admission: I have personally reviewed following labs and imaging studies  CBC: Recent Labs  Lab 01/24/21 1318  WBC 15.2*  NEUTROABS 12.9*  HGB 12.2  HCT 38.2  MCV 89.7  PLT 193    Basic Metabolic Panel: Recent Labs  Lab 01/24/21 1318  NA 138  K 3.6  CL 101  CO2 28  GLUCOSE 145*  BUN 18  CREATININE 1.09*  CALCIUM 9.0    GFR: Estimated Creatinine Clearance: 36.9 mL/min (A) (by C-G formula based on SCr of 1.09 mg/dL (H)).  Liver Function Tests: Recent Labs  Lab 01/24/21 1318  AST 17  ALT 13  ALKPHOS 91  BILITOT 0.8  PROT 7.3  ALBUMIN 3.4*   Urine analysis:    Component Value Date/Time   COLORURINE YELLOW 01/24/2021 1318   APPEARANCEUR HAZY (A) 01/24/2021 1318   LABSPEC 1.013 01/24/2021 1318   PHURINE 8.0 01/24/2021 1318   GLUCOSEU 50 (A) 01/24/2021 1318   HGBUR NEGATIVE 01/24/2021 1318   BILIRUBINUR NEGATIVE 01/24/2021 1318   KETONESUR NEGATIVE 01/24/2021 1318   PROTEINUR 100 (A) 01/24/2021 1318   NITRITE NEGATIVE 01/24/2021 1318   LEUKOCYTESUR LARGE (A) 01/24/2021 1318   Radiological Exams on Admission: DG Chest 2 View  Result Date: 01/24/2021 CLINICAL DATA:  Suspected sepsis  EXAM: CHEST - 2 VIEW COMPARISON:  None. FINDINGS: The heart size and mediastinal contours are within normal limits. Both lungs are clear. 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 Surgical clips are noted in the right axilla. Patient status post prior lower T-spine vertebral plasty. IMPRESSION: No active cardiopulmonary disease. Electronically Signed   By: Abelardo Diesel M.D.   On: 01/24/2021 14:34    EKG: Independently reviewed.   Assessment/Plan Principal Problem:   Acute encephalopathy In the setting of   Sepsis secondary to UTI POA ([place]) Admit to telemetry/inpatient. Continue time-limited IV fluids. Continue ceftriaxone 2 g IVPB daily. Follow-up blood culture and sensitivity. Follow up urine culture and sensitivity.  Active Problems:   Type 1 diabetes (HCC) Carbohydrate modified diet. Continue Tresiba 20 units daily. CBG monitoring with RI SS.    Volume depletion Continue IV fluids. Follow GFR, BUN/creatinine.  Mild protein malnutrition ([facility]) Protein supplementation in between meals. Consider nutritional services evaluation    Hyperlipidemia Continue rosuvastatin 5 mg p.o.at bedtime.    Dementia ([place]) Continue on memantine 5 mg p.o. twice daily. Continue donezepil 5 mg p.o. daily. Continue venlafaxine [place] 150 mg p.o. daily.    DVT prophylaxis: Lovenox SQ. Code Status:   Full code.. Family Communication:   Disposition Plan:   Patient is from:  SNF.  Anticipated DC to:  SNF.  Anticipated DC date:  01/27/2021.  Anticipated DC barriers: Clinical status.  Consults called:   Admission status:  Inpatient/telemetry.   Severity of Illness:  High severity after presenting with confusion in the setting of sepsis secondary to urine tract infection.  The patient will remain in the hospital for 48 to 72 hours for IV antibiotic therapy and close monitoring.  Reubin Milan MD [facility]  How to contact the [facility] Attending or Consulting provider [place] or covering provider during after hours [place], for this patient?   Check the care team in [facility] and look for a) attending/consulting [facility] provider listed and b) the [facility] team listed Log into [email or web address] and use [facility]'s universal password to access. If you do not have the password, please contact the hospital operator. Locate the [facility] provider you are looking for under [facility] and page to a number that you can be directly reached. If you still have difficulty reaching the provider, please page the [facility] (Director on Call) for the Hospitalists listed on amion for assistance.  01/24/2021, 6:00 PM   This document was prepared using Dragon voice recognition software and may contain some unintended transcription errors.

## 2021-01-25 ENCOUNTER — Encounter (HOSPITAL_COMMUNITY): Payer: Self-pay | Admitting: Internal Medicine

## 2021-01-25 DIAGNOSIS — N390 Urinary tract infection, site not specified: Secondary | ICD-10-CM | POA: Diagnosis not present

## 2021-01-25 DIAGNOSIS — A419 Sepsis, unspecified organism: Secondary | ICD-10-CM | POA: Diagnosis not present

## 2021-01-25 LAB — BLOOD CULTURE ID PANEL (REFLEXED) - BCID2

## 2021-01-25 LAB — CBC WITH DIFFERENTIAL/PLATELET
Abs Immature Granulocytes: 0.08 10*3/uL — ABNORMAL HIGH (ref 0.00–0.07)
Basophils Absolute: 0 10*3/uL (ref 0.0–0.1)
Basophils Relative: 0 %
Eosinophils Absolute: 0 10*3/uL (ref 0.0–0.5)
Eosinophils Relative: 0 %
HCT: 38.1 % (ref 36.0–46.0)
Hemoglobin: 12 g/dL (ref 12.0–15.0)
Immature Granulocytes: 1 %
Lymphocytes Relative: 7 %
Lymphs Abs: 1 10*3/uL (ref 0.7–4.0)
MCH: 28 pg (ref 26.0–34.0)
MCHC: 31.5 g/dL (ref 30.0–36.0)
MCV: 89 fL (ref 80.0–100.0)
Monocytes Absolute: 0.9 10*3/uL (ref 0.1–1.0)
Monocytes Relative: 6 %
Neutro Abs: 11.8 10*3/uL — ABNORMAL HIGH (ref 1.7–7.7)
Neutrophils Relative %: 86 %
Platelets: 295 10*3/uL (ref 150–400)
RBC: 4.28 MIL/uL (ref 3.87–5.11)
RDW: 13.2 % (ref 11.5–15.5)
WBC: 13.8 10*3/uL — ABNORMAL HIGH (ref 4.0–10.5)
nRBC: 0 % (ref 0.0–0.2)

## 2021-01-25 LAB — COMPREHENSIVE METABOLIC PANEL
ALT: 14 U/L (ref 0–44)
AST: 17 U/L (ref 15–41)
Albumin: 3 g/dL — ABNORMAL LOW (ref 3.5–5.0)
Alkaline Phosphatase: 79 U/L (ref 38–126)
Anion gap: 13 (ref 5–15)
BUN: 17 mg/dL (ref 8–23)
CO2: 23 mmol/L (ref 22–32)
Calcium: 9.1 mg/dL (ref 8.9–10.3)
Chloride: 101 mmol/L (ref 98–111)
Creatinine, Ser: 0.96 mg/dL (ref 0.44–1.00)
GFR, Estimated: >60 mL/min (ref >60)
Glucose, Bld: 170 mg/dL — ABNORMAL HIGH (ref 70–99)
Potassium: 3.6 mmol/L (ref 3.5–5.1)
Sodium: 137 mmol/L (ref 135–145)
Total Bilirubin: 0.7 mg/dL (ref 0.3–1.2)
Total Protein: 7 g/dL (ref 6.5–8.1)

## 2021-01-25 LAB — CBG MONITORING, ED
Glucose-Capillary: 187 mg/dL — ABNORMAL HIGH (ref 70–99)
Glucose-Capillary: 193 mg/dL — ABNORMAL HIGH (ref 70–99)
Glucose-Capillary: 202 mg/dL — ABNORMAL HIGH (ref 70–99)
Glucose-Capillary: 144 mg/dL — ABNORMAL HIGH (ref 70–99)

## 2021-01-25 LAB — GLUCOSE, CAPILLARY: Glucose-Capillary: 167 mg/dL — ABNORMAL HIGH (ref 70–99)

## 2021-01-25 NOTE — Progress Notes (Signed)
PHARMACY - PHYSICIAN COMMUNICATION CRITICAL VALUE ALERT - BLOOD CULTURE IDENTIFICATION (BCID)  Wanda Jones is an 75 y.o. female who presented to [facility] on 01/24/2021 with a chief complaint of confusion and urinary symptoms  Assessment:  1/4 bottles (anaerobic) with GNR; BCID Proteus species with no resistance   Name of physician (or Provider) Contacted: Dr. Rowe Pavy  Current antibiotics: Ceftriaxone 2g IV q24 hours  Changes to prescribed antibiotics recommended:  Patient is on recommended antibiotics - No changes needed  Results for orders placed or performed during the hospital encounter of 01/24/21  Blood Culture ID Panel (Reflexed) (Collected: 01/24/2021  1:23 PM)  Result Value Ref Range   Enterococcus faecalis NOT DETECTED NOT DETECTED   Enterococcus Faecium NOT DETECTED NOT DETECTED   Listeria monocytogenes NOT DETECTED NOT DETECTED   Staphylococcus species NOT DETECTED NOT DETECTED   Staphylococcus aureus (BCID) NOT DETECTED NOT DETECTED   Staphylococcus epidermidis NOT DETECTED NOT DETECTED   Staphylococcus lugdunensis NOT DETECTED NOT DETECTED   Streptococcus species NOT DETECTED NOT DETECTED   Streptococcus agalactiae NOT DETECTED NOT DETECTED   Streptococcus pneumoniae NOT DETECTED NOT DETECTED   Streptococcus pyogenes NOT DETECTED NOT DETECTED   A.calcoaceticus-baumannii NOT DETECTED NOT DETECTED   Bacteroides fragilis NOT DETECTED NOT DETECTED   Enterobacterales DETECTED (A) NOT DETECTED   Enterobacter cloacae complex NOT DETECTED NOT DETECTED   Escherichia coli NOT DETECTED NOT DETECTED   Klebsiella aerogenes NOT DETECTED NOT DETECTED   Klebsiella oxytoca NOT DETECTED NOT DETECTED   Klebsiella pneumoniae NOT DETECTED NOT DETECTED   Proteus species DETECTED (A) NOT DETECTED   Salmonella species NOT DETECTED NOT DETECTED   Serratia marcescens NOT DETECTED NOT DETECTED   Haemophilus influenzae NOT DETECTED NOT DETECTED   Neisseria meningitidis NOT DETECTED NOT  DETECTED   Pseudomonas aeruginosa NOT DETECTED NOT DETECTED   Stenotrophomonas maltophilia NOT DETECTED NOT DETECTED   Candida albicans NOT DETECTED NOT DETECTED   Candida auris NOT DETECTED NOT DETECTED   Candida glabrata NOT DETECTED NOT DETECTED   Candida krusei NOT DETECTED NOT DETECTED   Candida parapsilosis NOT DETECTED NOT DETECTED   Candida tropicalis NOT DETECTED NOT DETECTED   Cryptococcus neoformans/gattii NOT DETECTED NOT DETECTED   CTX-M ESBL NOT DETECTED NOT DETECTED   Carbapenem resistance IMP NOT DETECTED NOT DETECTED   Carbapenem resistance KPC NOT DETECTED NOT DETECTED   Carbapenem resistance NDM NOT DETECTED NOT DETECTED   Carbapenem resist OXA 48 LIKE NOT DETECTED NOT DETECTED   Carbapenem resistance VIM NOT DETECTED NOT DETECTED    Dimple Nanas, PharmD 01/25/2021 8:38 AM

## 2021-01-25 NOTE — Progress Notes (Signed)
Progress Note:    Wanda Jones    JGG:[id number] DOB: [birth year] DOA: 01/24/2021  PCP: Pcp, No    Brief Narrative:   75 year old female with a past medical history of dementia, type 2 diabetes mellitus, recurring UTIs, dyslipidemia.  Presented from independent living facility via EMS due to complaints of urinary symptoms and altered mental status.     Subjective:   Acute events reported by nursing staff overnight.  Patient seen and examined in the ED awaiting bed on the floor.  She is somnolent and oriented only to place.  She cannot tell me her name or the date.    Assessment and Plan:   Sepsis secondary to UTI and bacteremia: Continue ceftriaxone 2 g IV daily.  1 out of 4 blood cultures positive for gram-negative rods Proteus species.  No resistance.  Acute metabolic encephalopathy secondary to UTI/bacteremia: Plan as above.  Volume depletion: Continue IV fluids  Lipidemia: Continue home rosuvastatin  Dementia: Continue home memantine, donepezil, venlafaxine  Type 1 diabetes mellitus: Continue Tresiba 20 units daily.  CBG monitoring with SSI    Other information:    DVT prophylaxis: Lovenox subcu Code Status: Full code Family Communication: None present Disposition:   Status is: Inpatient  Remains inpatient appropriate because:Altered mental status  Dispo: The patient is from: ILF              Anticipated d/c is to: ILF              Patient currently is not medically stable to d/c.   Difficult to place patient No           Consultants:   None    Objective:    Vitals:   01/25/21 1145 01/25/21 1200 01/25/21 1400 01/25/21 1700  BP:  (!) 149/66 (!) 121/46 127/90  Pulse:  99 84 87  Resp:  (!) 31 (!) 29 (!) 21  Temp: (!) 102.4 F (39.1 C)  (!) 101.3 F (38.5 C) 100 F (37.8 C)  TempSrc: Rectal  Rectal Rectal  SpO2:  95% 94% 99%  Weight:      Height:       No intake or output data in the 24 hours ending 01/25/21 1748 Filed  Weights   01/24/21 1314  Weight: 59 kg       Physical Exam:    General exam: Appears calm and comfortable. dry mucous membranes. Respiratory system: Clear to auscultation. Respiratory effort normal. Cardiovascular system: S1 & S2 heard, RRR. No JVD, murmurs, rubs, gallops or clicks. No pedal edema. Gastrointestinal system: Abdomen is nondistended, soft and nontender. No organomegaly or masses felt. Normal bowel sounds heard. Central nervous system: Alert and oriented x1 to name only.  Somnolent.  No focal neurological deficit. Extremities: Symmetric 5 x 5 power. Skin: No rashes, lesions or ulcers Psychiatry: Judgement and insight appear normal. Mood & affect appropriate.     Data Reviewed:    I have personally reviewed following labs and imaging studies  CBC: Recent Labs  Lab 01/24/21 1318 01/25/21 0624  WBC 15.2* 13.8*  NEUTROABS 12.9* 11.8*  HGB 12.2 12.0  HCT 38.2 38.1  MCV 89.7 89.0  PLT 397 546    Basic Metabolic Panel: Recent Labs  Lab 01/24/21 1318 01/25/21 0624  NA 138 137  K 3.6 3.6  CL 101 101  CO2 28 23  GLUCOSE 145* 170*  BUN 18 17  CREATININE 1.09* 0.96  CALCIUM 9.0 9.1    GFR: Estimated Creatinine  Clearance: 41.9 mL/min (by C-G formula based on SCr of 0.96 mg/dL).  Liver Function Tests: Recent Labs  Lab 01/24/21 1318 01/25/21 0624  AST 17 17  ALT 13 14  ALKPHOS 91 79  BILITOT 0.8 0.7  PROT 7.3 7.0  ALBUMIN 3.4* 3.0*    CBG: Recent Labs  Lab 01/24/21 1315 01/25/21 0628 01/25/21 0947 01/25/21 1258 01/25/21 1652  GLUCAP 131* 187* 193* 202* 144*     Recent Results (from the past 240 hour(s))  Culture, blood (Routine x 2)     Status: None (Preliminary result)   Collection Time: 01/24/21  1:18 PM   Specimen: BLOOD  Result Value Ref Range Status   Specimen Description   Final    BLOOD RIGHT ANTECUBITAL Performed at [facility], [place] [address] [place], [place] [address]    Special Requests   Final     BOTTLES DRAWN AEROBIC AND ANAEROBIC Blood Culture adequate volume Performed at [facility] [address], [place], [place] [address]    Culture   Final    NO GROWTH < 24 HOURS Performed at [facility] [address] [place], [place] [address]    Report Status PENDING  Incomplete  Culture, blood (Routine x 2)     Status: None (Preliminary result)   Collection Time: 01/24/21  1:23 PM   Specimen: BLOOD  Result Value Ref Range Status   Specimen Description   Final    BLOOD BLOOD RIGHT HAND Performed at [facility] [address] [place], [place] [address]    Special Requests   Final    BOTTLES DRAWN AEROBIC AND ANAEROBIC Blood Culture adequate volume Performed at [facility] [address], [place], [place] [address]    Culture  Setup Time   Final    GRAM NEGATIVE RODS ANAEROBIC BOTTLE ONLY Organism ID to follow CRITICAL RESULT CALLED TO, READ BACK BY AND VERIFIED WITH: Kittie Plater [facility] 01/25/21 0835 JDW    Culture   Final    NO GROWTH < 24 HOURS Performed at [facility], [place] [address], [place], [place] [address]    Report Status PENDING  Incomplete  Blood Culture ID Panel (Reflexed)     Status: Abnormal   Collection Time: 01/24/21  1:23 PM  Result Value Ref Range Status   Enterococcus faecalis NOT DETECTED NOT DETECTED Final   Enterococcus Faecium NOT DETECTED NOT DETECTED Final   Listeria monocytogenes NOT DETECTED NOT DETECTED Final   Staphylococcus species NOT DETECTED NOT DETECTED Final   Staphylococcus aureus (BCID) NOT DETECTED NOT DETECTED Final   Staphylococcus epidermidis NOT DETECTED NOT DETECTED Final   Staphylococcus lugdunensis NOT DETECTED NOT DETECTED Final   Streptococcus species NOT DETECTED NOT DETECTED Final   Streptococcus agalactiae NOT DETECTED NOT DETECTED Final   Streptococcus pneumoniae NOT DETECTED NOT DETECTED Final   Streptococcus pyogenes NOT DETECTED NOT DETECTED  Final   A.calcoaceticus-baumannii NOT DETECTED NOT DETECTED Final   Bacteroides fragilis NOT DETECTED NOT DETECTED Final   Enterobacterales DETECTED (A) NOT DETECTED Final    Comment: Enterobacterales represent a large order of gram negative bacteria, not a single organism. CRITICAL RESULT CALLED TO, READ BACK BY AND VERIFIED WITH: Kittie Plater [facility] 01/25/21 0835 JDW    Enterobacter cloacae complex NOT DETECTED NOT DETECTED Final   Escherichia coli NOT DETECTED NOT DETECTED Final   Klebsiella aerogenes NOT DETECTED NOT DETECTED Final   Klebsiella oxytoca NOT DETECTED NOT  DETECTED Final   Klebsiella pneumoniae NOT DETECTED NOT DETECTED Final   Proteus species DETECTED (A) NOT DETECTED Final    Comment: CRITICAL RESULT CALLED TO, READ BACK BY AND VERIFIED WITH: Kittie Plater [facility] 01/25/21 0835 JDW    Salmonella species NOT DETECTED NOT DETECTED Final   Serratia marcescens NOT DETECTED NOT DETECTED Final   Haemophilus influenzae NOT DETECTED NOT DETECTED Final   Neisseria meningitidis NOT DETECTED NOT DETECTED Final   Pseudomonas aeruginosa NOT DETECTED NOT DETECTED Final   Stenotrophomonas maltophilia NOT DETECTED NOT DETECTED Final   Candida albicans NOT DETECTED NOT DETECTED Final   Candida auris NOT DETECTED NOT DETECTED Final   Candida glabrata NOT DETECTED NOT DETECTED Final   Candida krusei NOT DETECTED NOT DETECTED Final   Candida parapsilosis NOT DETECTED NOT DETECTED Final   Candida tropicalis NOT DETECTED NOT DETECTED Final   Cryptococcus neoformans/gattii NOT DETECTED NOT DETECTED Final   CTX-M ESBL NOT DETECTED NOT DETECTED Final   Carbapenem resistance IMP NOT DETECTED NOT DETECTED Final   Carbapenem resistance KPC NOT DETECTED NOT DETECTED Final   Carbapenem resistance NDM NOT DETECTED NOT DETECTED Final   Carbapenem resist OXA 48 LIKE NOT DETECTED NOT DETECTED Final   Carbapenem resistance VIM NOT DETECTED NOT DETECTED Final    Comment: Performed at [facility], [address], [place], [place] [address]  Resp Panel by RT-PCR (Flu A&B, Covid) Nasopharyngeal Swab     Status: None   Collection Time: 01/24/21  1:28 PM   Specimen: Nasopharyngeal Swab; Nasopharyngeal(NP) swabs in vial transport medium  Result Value Ref Range Status   SARS Coronavirus 2 by RT PCR NEGATIVE NEGATIVE Final    Comment: (NOTE) SARS-CoV-2 target nucleic acids are NOT DETECTED.  The SARS-CoV-2 RNA is generally detectable in upper respiratory specimens during the acute phase of infection. The lowest concentration of SARS-CoV-2 viral copies this assay can detect is 138 copies/mL. A negative result does not preclude SARS-Cov-2 infection and should not be used as the sole basis for treatment or other patient management decisions. A negative result may occur with  improper specimen collection/handling, submission of specimen other than nasopharyngeal swab, presence of viral mutation(s) within the areas targeted by this assay, and inadequate number of viral copies(<138 copies/mL). A negative result must be combined with clinical observations, patient history, and epidemiological information. The expected result is Negative.  Fact Sheet for Patients:  EntrepreneurPulse.com.au  Fact Sheet for Healthcare Providers:  IncredibleEmployment.be  This test is no t yet approved or cleared by the [place] FDA and  has been authorized for detection and/or diagnosis of SARS-CoV-2 by FDA under an Emergency Use Authorization (EUA). This EUA will remain  in effect (meaning this test can be used) for the duration of the COVID-19 declaration under Section 564(b)(1) of the Act, 21 U.S.C.section 360bbb-3(b)(1), unless the authorization is terminated  or revoked sooner.       Influenza A by PCR NEGATIVE NEGATIVE Final   Influenza B by PCR NEGATIVE NEGATIVE Final    Comment: (NOTE) The Xpert Xpress SARS-CoV-2/FLU/RSV plus assay is intended as an  aid in the diagnosis of influenza from Nasopharyngeal swab specimens and should not be used as a sole basis for treatment. Nasal washings and aspirates are unacceptable for Xpert Xpress SARS-CoV-2/FLU/RSV testing.  Fact Sheet for Patients: EntrepreneurPulse.com.au  Fact Sheet for Healthcare Providers: IncredibleEmployment.be  This test is not yet approved or cleared by the [place] FDA and has been authorized for detection  and/or diagnosis of SARS-CoV-2 by FDA under an Emergency Use Authorization (EUA). This EUA will remain in effect (meaning this test can be used) for the duration of the COVID-19 declaration under Section 564(b)(1) of the Act, 21 U.S.C. section 360bbb-3(b)(1), unless the authorization is terminated or revoked.  Performed at [facility], [place] [address], [place], [place] [address]          Radiology Studies:    DG Chest 2 View  Result Date: 01/24/2021 CLINICAL DATA:  Suspected sepsis EXAM: CHEST - 2 VIEW COMPARISON:  None. FINDINGS: The heart size and mediastinal contours are within normal limits. Both lungs are clear. 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 Surgical clips are noted in the right axilla. Patient status post prior lower T-spine vertebral plasty. IMPRESSION: No active cardiopulmonary disease. Electronically Signed   By: Abelardo Diesel M.D.   On: 01/24/2021 14:34        Medications:    Scheduled Meds:  sodium chloride   Intravenous Once   donepezil  5 mg Oral QHS   enoxaparin (LOVENOX) injection  40 mg Subcutaneous Q24H   insulin aspart  0-9 Units Subcutaneous TID WC   insulin degludec  20 Units Subcutaneous Daily   levothyroxine  88 mcg Oral QAC breakfast   memantine  5 mg Oral BID   mirabegron ER  50 mg Oral Daily   rosuvastatin  5 mg Oral QHS   venlafaxine XR  150 mg Oral Q breakfast   Continuous Infusions:  cefTRIAXone (ROCEPHIN)  IV 2 g (01/25/21 1539)        LOS: 1 day    Time spent: 35 minutes    Leslee Home, MD [facility]   To contact the attending provider between 7A-7P or the covering provider during after hours 7P-7A, please log into the web site [email or web address] and access using universal [place] password for that web site. If you do not have the password, please call the hospital operator.  01/25/2021, 5:48 PM

## 2021-01-25 NOTE — ED Notes (Signed)
Admitting provider at bedside.

## 2021-01-25 NOTE — Progress Notes (Signed)
Requesting full set of vital signs before accepting patient due to previous temp, elevated BP, and increased RR.

## 2021-01-25 NOTE — ED Notes (Signed)
Floor RN Lanelle Bal notified of pts bed assignment status. Per Lanelle Bal, she just received a pt to floor, will notify ED RN when we are able to transport pt up.

## 2021-01-25 NOTE — ED Notes (Signed)
Per receiving unit, pt more appropriate for progressive level of care instead of telemetry.  Room assignment will be changed.

## 2021-01-25 NOTE — ED Notes (Signed)
Pt asleep, equal chest rise noted.  Will continue to monitor.  

## 2021-01-26 ENCOUNTER — Ambulatory Visit: Payer: Medicare HMO | Admitting: Endocrinology

## 2021-01-26 DIAGNOSIS — N390 Urinary tract infection, site not specified: Secondary | ICD-10-CM | POA: Diagnosis not present

## 2021-01-26 DIAGNOSIS — A419 Sepsis, unspecified organism: Secondary | ICD-10-CM | POA: Diagnosis not present

## 2021-01-26 LAB — BASIC METABOLIC PANEL
Anion gap: 12 (ref 5–15)
BUN: 20 mg/dL (ref 8–23)
CO2: 23 mmol/L (ref 22–32)
Calcium: 8.7 mg/dL — ABNORMAL LOW (ref 8.9–10.3)
Chloride: 101 mmol/L (ref 98–111)
Creatinine, Ser: 1.03 mg/dL — ABNORMAL HIGH (ref 0.44–1.00)
GFR, Estimated: 57 mL/min — ABNORMAL LOW (ref >60)
Glucose, Bld: 210 mg/dL — ABNORMAL HIGH (ref 70–99)
Potassium: 3.5 mmol/L (ref 3.5–5.1)
Sodium: 136 mmol/L (ref 135–145)

## 2021-01-26 LAB — GLUCOSE, CAPILLARY
Glucose-Capillary: 230 mg/dL — ABNORMAL HIGH (ref 70–99)
Glucose-Capillary: 266 mg/dL — ABNORMAL HIGH (ref 70–99)
Glucose-Capillary: 225 mg/dL — ABNORMAL HIGH (ref 70–99)

## 2021-01-26 MED ORDER — SODIUM CHLORIDE 0.9 % IV SOLN: 2.0000 g | INTRAVENOUS | Status: DC

## 2021-01-26 MED ORDER — SODIUM CHLORIDE 0.9 % IV SOLN
2.0000 g | INTRAVENOUS | Status: DC
  Administered 2021-01-26: 2 g via INTRAVENOUS
  Filled 2021-01-26 (×2): qty 20

## 2021-01-26 NOTE — Progress Notes (Addendum)
The    Progress Note:    Wanda Jones    ZSM:[id number] DOB: 1945-06-08 DOA: 01/24/2021  PCP: Pcp, No    Brief Narrative:   75 year old female with a past medical history of dementia, type 2 diabetes mellitus, recurring UTIs, dyslipidemia.  Presented from independent living facility via EMS due to complaints of urinary symptoms and altered mental status.     Subjective:   No acute events reported by nursing staff overnight.  Patient seen and examined.  Encephalopathy resolved.  She can tell me her name, place, date, president.    Assessment and Plan:   Sepsis secondary to Proteus UTI/bacteremia: Continue ceftriaxone 2 g IV daily. Blood cultures positive for gram-negative rods Proteus species.  Awaiting S/S. Will order repeat blood cultures.  Acute metabolic encephalopathy secondary to UTI/bacteremia: Resolved  Hyperlipidemia: Continue home rosuvastatin  Dementia: Continue home memantine, donepezil, venlafaxine  IDDM: Continue Tresiba 20 units daily.  CBG monitoring with SSI    Other information:    DVT prophylaxis: Lovenox subcu Code Status: Full code Family Communication: None present Disposition:   Status is: Inpatient  Remains inpatient appropriate because:Altered mental status  Dispo: The patient is from: ILF              Anticipated d/c is to: ILF              Patient currently is not medically stable to d/c.   Difficult to place patient No    Consultants:   None    Objective:    Vitals:   01/26/21 0230 01/26/21 0555 01/26/21 0958 01/26/21 1346  BP: 129/67 119/78 (!) 116/53 121/65  Pulse: (!) 102 99 94 92  Resp: 19 (!) 21 18 18   Temp: 98.6 F (37 C) 98.6 F (37 C) 97.7 F (36.5 C) 98.5 F (36.9 C)  TempSrc: Oral Oral Oral Oral  SpO2: 97% 99%  98%  Weight:      Height:        Intake/Output Summary (Last 24 hours) at 01/26/2021 1511 Last data filed at 01/26/2021 0300 Gross per 24 hour  Intake --  Output 500 ml  Net -500 ml    Filed Weights   01/24/21 1314 01/25/21 1823  Weight: 59 kg 59.6 kg       Physical Exam:    General exam: Appears calm and comfortable.  Respiratory system: Clear to auscultation. Respiratory effort normal. Cardiovascular system: S1 & S2 heard, RRR. No JVD, murmurs, rubs, gallops or clicks. No pedal edema. Gastrointestinal system: Abdomen is nondistended, soft and nontender. No organomegaly or masses felt. Normal bowel sounds heard. Central nervous system: Alert and oriented x4.  Somnolent.  No focal neurological deficit. Extremities: Symmetric 5 x 5 power. Skin: No rashes, lesions or ulcers Psychiatry: Judgement and insight appear normal. Mood & affect appropriate.     Data Reviewed:    I have personally reviewed following labs and imaging studies  CBC: Recent Labs  Lab 01/24/21 1318 01/25/21 0624  WBC 15.2* 13.8*  NEUTROABS 12.9* 11.8*  HGB 12.2 12.0  HCT 38.2 38.1  MCV 89.7 89.0  PLT 397 295     Basic Metabolic Panel: Recent Labs  Lab 01/24/21 1318 01/25/21 0624 01/26/21 0335  NA 138 137 136  K 3.6 3.6 3.5  CL 101 101 101  CO2 28 23 23   GLUCOSE 145* 170* 210*  BUN 18 17 20   CREATININE 1.09* 0.96 1.03*  CALCIUM 9.0 9.1 8.7*     GFR: Estimated Creatinine Clearance:  39 mL/min (A) (by C-G formula based on SCr of 1.03 mg/dL (H)).  Liver Function Tests: Recent Labs  Lab 01/24/21 1318 01/25/21 0624  AST 17 17  ALT 13 14  ALKPHOS 91 79  BILITOT 0.8 0.7  PROT 7.3 7.0  ALBUMIN 3.4* 3.0*     CBG: Recent Labs  Lab 01/25/21 1258 01/25/21 1652 01/25/21 1945 01/26/21 0736 01/26/21 1148  GLUCAP 202* 144* 167* 230* 266*      Recent Results (from the past 240 hour(s))  Culture, blood (Routine x 2)     Status: None (Preliminary result)   Collection Time: 01/24/21  1:18 PM   Specimen: BLOOD  Result Value Ref Range Status   Specimen Description   Final    BLOOD RIGHT ANTECUBITAL Performed at [facility], [place]  [address], [place], [place] [address]    Special Requests   Final    BOTTLES DRAWN AEROBIC AND ANAEROBIC Blood Culture adequate volume Performed at [place] [facility] [address], [place], [place] [address]    Culture   Final    NO GROWTH 2 DAYS Performed at [facility] [address] [place], [place] [address]    Report Status PENDING  Incomplete  Culture, blood (Routine x 2)     Status: Abnormal (Preliminary result)   Collection Time: 01/24/21  1:23 PM   Specimen: BLOOD  Result Value Ref Range Status   Specimen Description   Final    BLOOD BLOOD RIGHT HAND Performed at [facility] [address], [place], [place] [address]    Special Requests   Final    BOTTLES DRAWN AEROBIC AND ANAEROBIC Blood Culture adequate volume Performed at [facility] [address], [place], [place] [address]    Culture  Setup Time   Final    GRAM NEGATIVE RODS IN BOTH AEROBIC AND ANAEROBIC BOTTLES Organism ID to follow CRITICAL RESULT CALLED TO, READ BACK BY AND VERIFIED WITH: Kittie Plater [facility] 01/25/21 0835 JDW    Culture (A)  Final    PROTEUS MIRABILIS SUSCEPTIBILITIES TO FOLLOW Performed at [facility], [place] [address] [place], [place] [address]    Report Status PENDING  Incomplete  Blood Culture ID Panel (Reflexed)     Status: Abnormal   Collection Time: 01/24/21  1:23 PM  Result Value Ref Range Status   Enterococcus faecalis NOT DETECTED NOT DETECTED Final   Enterococcus Faecium NOT DETECTED NOT DETECTED Final   Listeria monocytogenes NOT DETECTED NOT DETECTED Final   Staphylococcus species NOT DETECTED NOT DETECTED Final   Staphylococcus aureus (BCID) NOT DETECTED NOT DETECTED Final   Staphylococcus epidermidis NOT DETECTED NOT DETECTED Final   Staphylococcus lugdunensis NOT DETECTED NOT DETECTED Final   Streptococcus species NOT DETECTED NOT DETECTED Final   Streptococcus agalactiae NOT DETECTED NOT DETECTED Final    Streptococcus pneumoniae NOT DETECTED NOT DETECTED Final   Streptococcus pyogenes NOT DETECTED NOT DETECTED Final   A.calcoaceticus-baumannii NOT DETECTED NOT DETECTED Final   Bacteroides fragilis NOT DETECTED NOT DETECTED Final   Enterobacterales DETECTED (A) NOT DETECTED Final    Comment: Enterobacterales represent a large order of gram negative bacteria, not a single organism. CRITICAL RESULT CALLED TO, READ BACK BY AND VERIFIED WITH: Kittie Plater [facility] 01/25/21 0835 JDW    Enterobacter cloacae complex NOT DETECTED NOT DETECTED Final   Escherichia coli NOT DETECTED NOT DETECTED Final   Klebsiella aerogenes NOT DETECTED NOT DETECTED Final  Klebsiella oxytoca NOT DETECTED NOT DETECTED Final   Klebsiella pneumoniae NOT DETECTED NOT DETECTED Final   Proteus species DETECTED (A) NOT DETECTED Final    Comment: CRITICAL RESULT CALLED TO, READ BACK BY AND VERIFIED WITH: Kittie Plater [facility] 01/25/21 0835 JDW    Salmonella species NOT DETECTED NOT DETECTED Final   Serratia marcescens NOT DETECTED NOT DETECTED Final   Haemophilus influenzae NOT DETECTED NOT DETECTED Final   Neisseria meningitidis NOT DETECTED NOT DETECTED Final   Pseudomonas aeruginosa NOT DETECTED NOT DETECTED Final   Stenotrophomonas maltophilia NOT DETECTED NOT DETECTED Final   Candida albicans NOT DETECTED NOT DETECTED Final   Candida auris NOT DETECTED NOT DETECTED Final   Candida glabrata NOT DETECTED NOT DETECTED Final   Candida krusei NOT DETECTED NOT DETECTED Final   Candida parapsilosis NOT DETECTED NOT DETECTED Final   Candida tropicalis NOT DETECTED NOT DETECTED Final   Cryptococcus neoformans/gattii NOT DETECTED NOT DETECTED Final   CTX-M ESBL NOT DETECTED NOT DETECTED Final   Carbapenem resistance IMP NOT DETECTED NOT DETECTED Final   Carbapenem resistance KPC NOT DETECTED NOT DETECTED Final   Carbapenem resistance NDM NOT DETECTED NOT DETECTED Final   Carbapenem resist OXA 48 LIKE NOT DETECTED NOT DETECTED  Final   Carbapenem resistance VIM NOT DETECTED NOT DETECTED Final    Comment: Performed at [facility], [address] [place],  [address]  Resp Panel by RT-PCR (Flu A&B, Covid) Nasopharyngeal Swab     Status: None   Collection Time: 01/24/21  1:28 PM   Specimen: Nasopharyngeal Swab; Nasopharyngeal(NP) swabs in vial transport medium  Result Value Ref Range Status   SARS Coronavirus 2 by RT PCR NEGATIVE NEGATIVE Final    Comment: (NOTE) SARS-CoV-2 target nucleic acids are NOT DETECTED.  The SARS-CoV-2 RNA is generally detectable in upper respiratory specimens during the acute phase of infection. The lowest concentration of SARS-CoV-2 viral copies this assay can detect is 138 copies/mL. A negative result does not preclude SARS-Cov-2 infection and should not be used as the sole basis for treatment or other patient management decisions. A negative result may occur with  improper specimen collection/handling, submission of specimen other than nasopharyngeal swab, presence of viral mutation(s) within the areas targeted by this assay, and inadequate number of viral copies(<138 copies/mL). A negative result must be combined with clinical observations, patient history, and epidemiological information. The expected result is Negative.  Fact Sheet for Patients:  EntrepreneurPulse.com.au  Fact Sheet for Healthcare Providers:  IncredibleEmployment.be  This test is no t yet approved or cleared by the [place] FDA and  has been authorized for detection and/or diagnosis of SARS-CoV-2 by FDA under an Emergency Use Authorization (EUA). This EUA will remain  in effect (meaning this test can be used) for the duration of the COVID-19 declaration under Section 564(b)(1) of the Act, 21 U.S.C.section 360bbb-3(b)(1), unless the authorization is terminated  or revoked sooner.       Influenza A by PCR NEGATIVE NEGATIVE Final   Influenza B by PCR  NEGATIVE NEGATIVE Final    Comment: (NOTE) The Xpert Xpress SARS-CoV-2/FLU/RSV plus assay is intended as an aid in the diagnosis of influenza from Nasopharyngeal swab specimens and should not be used as a sole basis for treatment. Nasal washings and aspirates are unacceptable for Xpert Xpress SARS-CoV-2/FLU/RSV testing.  Fact Sheet for Patients: EntrepreneurPulse.com.au  Fact Sheet for Healthcare Providers: IncredibleEmployment.be  This test is not yet approved or cleared by the [place] and  has been authorized for detection and/or diagnosis of SARS-CoV-2 by FDA under an Emergency Use Authorization (EUA). This EUA will remain in effect (meaning this test can be used) for the duration of the COVID-19 declaration under Section 564(b)(1) of the Act, 21 U.S.C. section 360bbb-3(b)(1), unless the authorization is terminated or revoked.  Performed at [facility], [place] [address], [place], [place] [address]   Urine Culture     Status: Abnormal (Preliminary result)   Collection Time: 01/24/21  1:59 PM   Specimen: In/Out Cath Urine  Result Value Ref Range Status   Specimen Description   Final    IN/OUT CATH URINE Performed at [facility] [address], [place], [place] [address]    Special Requests   Final    NONE Performed at [facility], [place] [address], [place],  [address]    Culture (A)  Final    60,000 COLONIES/mL PROTEUS MIRABILIS SUSCEPTIBILITIES TO FOLLOW Performed at [facility], [place] [address], [place],  [address]    Report Status PENDING  Incomplete          Radiology Studies:    No results found.      Medications:    Scheduled Meds:  sodium chloride   Intravenous Once   donepezil  5 mg Oral QHS   enoxaparin (LOVENOX) injection  40 mg Subcutaneous Q24H   insulin aspart  0-9 Units Subcutaneous TID WC   insulin glargine-yfgn  20  Units Subcutaneous Daily   levothyroxine  88 mcg Oral QAC breakfast   memantine  5 mg Oral BID   mirabegron ER  50 mg Oral Daily   rosuvastatin  5 mg Oral QHS   venlafaxine XR  150 mg Oral Q breakfast   Continuous Infusions:  cefTRIAXone (ROCEPHIN)  IV         LOS: 2 days    Time spent: 35 minutes    Leslee Home, MD [facility]   To contact the attending provider between 7A-7P or the covering provider during after hours 7P-7A, please log into the web site [email or web address] and access using universal [place] password for that web site. If you do not have the password, please call the hospital operator.  01/26/2021, 3:11 PM

## 2021-01-27 ENCOUNTER — Ambulatory Visit: Payer: Medicare HMO | Admitting: Endocrinology

## 2021-01-27 DIAGNOSIS — F0390 Unspecified dementia without behavioral disturbance: Secondary | ICD-10-CM | POA: Diagnosis not present

## 2021-01-27 DIAGNOSIS — G9340 Encephalopathy, unspecified: Secondary | ICD-10-CM

## 2021-01-27 DIAGNOSIS — N3000 Acute cystitis without hematuria: Secondary | ICD-10-CM | POA: Diagnosis not present

## 2021-01-27 DIAGNOSIS — A419 Sepsis, unspecified organism: Secondary | ICD-10-CM | POA: Diagnosis not present

## 2021-01-27 LAB — BASIC METABOLIC PANEL
Anion gap: 10 (ref 5–15)
BUN: 21 mg/dL (ref 8–23)
CO2: 25 mmol/L (ref 22–32)
Calcium: 8.5 mg/dL — ABNORMAL LOW (ref 8.9–10.3)
Chloride: 100 mmol/L (ref 98–111)
Creatinine, Ser: 0.91 mg/dL (ref 0.44–1.00)
GFR, Estimated: >60 mL/min (ref >60)
Glucose, Bld: 275 mg/dL — ABNORMAL HIGH (ref 70–99)
Potassium: 3.2 mmol/L — ABNORMAL LOW (ref 3.5–5.1)
Sodium: 135 mmol/L (ref 135–145)

## 2021-01-27 LAB — CBC
HCT: 32.3 % — ABNORMAL LOW (ref 36.0–46.0)
Hemoglobin: 10.8 g/dL — ABNORMAL LOW (ref 12.0–15.0)
MCH: 29 pg (ref 26.0–34.0)
MCHC: 33.4 g/dL (ref 30.0–36.0)
MCV: 86.8 fL (ref 80.0–100.0)
Platelets: 207 10*3/uL (ref 150–400)
RBC: 3.72 MIL/uL — ABNORMAL LOW (ref 3.87–5.11)
RDW: 12.6 % (ref 11.5–15.5)
WBC: 6.5 10*3/uL (ref 4.0–10.5)
nRBC: 0 % (ref 0.0–0.2)

## 2021-01-27 LAB — GLUCOSE, CAPILLARY
Glucose-Capillary: 229 mg/dL — ABNORMAL HIGH (ref 70–99)
Glucose-Capillary: 311 mg/dL — ABNORMAL HIGH (ref 70–99)
Glucose-Capillary: 250 mg/dL — ABNORMAL HIGH (ref 70–99)
Glucose-Capillary: 217 mg/dL — ABNORMAL HIGH (ref 70–99)

## 2021-01-27 LAB — CULTURE, BLOOD (ROUTINE X 2): Special Requests: ADEQUATE

## 2021-01-27 LAB — URINE CULTURE: Culture: 60000 — AB

## 2021-01-27 MED ORDER — POTASSIUM CHLORIDE CRYS ER 20 MEQ PO TBCR
40.0000 meq | EXTENDED_RELEASE_TABLET | Freq: Once | ORAL | Status: AC
  Administered 2021-01-27: 40 meq via ORAL
  Filled 2021-01-27: qty 2

## 2021-01-27 MED ORDER — INSULIN GLARGINE-YFGN 100 UNIT/ML ~~LOC~~ SOLN
4.0000 [IU] | Freq: Once | SUBCUTANEOUS | Status: AC
  Administered 2021-01-27: 4 [IU] via SUBCUTANEOUS
  Filled 2021-01-27: qty 0.04

## 2021-01-27 MED ORDER — INSULIN GLARGINE-YFGN 100 UNIT/ML ~~LOC~~ SOLN
24.0000 [IU] | Freq: Every day | SUBCUTANEOUS | Status: DC
  Administered 2021-01-28: 24 [IU] via SUBCUTANEOUS
  Filled 2021-01-27: qty 0.24

## 2021-01-27 MED ORDER — CEPHALEXIN 500 MG PO CAPS
500.0000 mg | ORAL_CAPSULE | Freq: Four times a day (QID) | ORAL | Status: DC
  Administered 2021-01-27 – 2021-01-28 (×4): 500 mg via ORAL
  Filled 2021-01-27 (×4): qty 1

## 2021-01-27 MED ORDER — HALOPERIDOL LACTATE 5 MG/ML IJ SOLN
1.0000 mg | Freq: Once | INTRAMUSCULAR | Status: AC
  Administered 2021-01-27: 1 mg via INTRAVENOUS
  Filled 2021-01-27: qty 1

## 2021-01-27 NOTE — Care Management Important Message (Signed)
Important Message  Patient Details IM Letter given to the Patient. Name: Wanda Jones MRN: [id number] Date of Birth: Mar 20, 1946   Medicare Important Message Given:  Yes     Kerin Salen 01/27/2021, 11:25 AM

## 2021-01-27 NOTE — Evaluation (Signed)
Occupational Therapy Evaluation Patient Details Name: Wanda Jones MRN: [id number] DOB: 04-08-1946 Today's Date: 01/27/2021   History of Present Illness Pt is a 75 y.o. F who presents with malaise, flank pain, suprpubic abdominal pain, dysuria, rigors and hypergylcemia. Admitted with sepsis secondary to UTI/cellulitis. Significant PMH: three vessel CAD, type 1 diabetes mellitus, HTN, dementia, breast CA.   Clinical Impression   Pt admitted with the above diagnoses and presents with below problem list. Pt will benefit from continued acute OT to address the below listed deficits and maximize independence with basic ADLs prior to d/c to venue below. At baseline, pt reports she needs no assistance with bathing/dressing. She utilizes a Radiation protection practitioner and walks to dining hall for her meals. Currently. Pt needs up to min A with LB ADLs and transfers/mobility utilizing a rolling walker. Son present throughout session.        Recommendations for follow up therapy are one component of a multi-disciplinary discharge planning process, led by the attending physician.  Recommendations may be updated based on patient status, additional functional criteria and insurance authorization.   Follow Up Recommendations  Home health OT;Supervision/Assistance - 24 hour    Equipment Recommendations  None recommended by OT    Recommendations for Other Services       Precautions / Restrictions Precautions Precautions: Fall Restrictions Weight Bearing Restrictions: No      Mobility Bed Mobility Overal bed mobility: Needs Assistance Bed Mobility: Supine to Sit     Supine to sit: Min assist     General bed mobility comments: Min A to come to full EOB position. Cues for utilizing bed rails. Extra time/effort.    Transfers Overall transfer level: Needs assistance Equipment used: Rolling walker (2 wheeled) Transfers: Sit to/from Stand Sit to Stand: Min assist         General transfer comment: up to min  A to steady. to/from EOB and recliner.    Balance Overall balance assessment: Needs assistance Sitting-balance support: Feet supported Sitting balance-Leahy Scale: Fair     Standing balance support: Bilateral upper extremity supported Standing balance-Leahy Scale: Poor Standing balance comment: reliant on external support                           ADL either performed or assessed with clinical judgement   ADL Overall ADL's : Needs assistance/impaired Eating/Feeding: Set up   Grooming: Set up   Upper Body Bathing: Set up   Lower Body Bathing: Minimal assistance;Sit to/from stand   Upper Body Dressing : Set up;Sitting   Lower Body Dressing: Minimal assistance;Sit to/from stand   Toilet Transfer: Min guard;Minimal assistance;Ambulation   Toileting- Clothing Manipulation and Hygiene: Minimal assistance;Min guard;Sitting/lateral lean;Sit to/from stand   Tub/ Shower Transfer: Walk-in shower;Min guard;Minimal assistance;Ambulation;Shower seat;Rolling walker   Functional mobility during ADLs: Min guard;Minimal assistance;Rolling walker General ADL Comments: Up to light min A needed for transfers and to manage rw/steady. Noted, pt uses rollator at baseline. Pt completed household distance mobility with up to min A utilizing a rolling walker     Vision         Perception     Praxis      Pertinent Vitals/Pain Pain Assessment: Faces Faces Pain Scale: Hurts a little bit Pain Location: back Pain Descriptors / Indicators: Discomfort Pain Intervention(s): Monitored during session     Hand Dominance     Extremity/Trunk Assessment Upper Extremity Assessment Upper Extremity Assessment: Generalized weakness   Lower Extremity Assessment Lower  Extremity Assessment: Defer to PT evaluation   Cervical / Trunk Assessment Cervical / Trunk Assessment: Kyphotic   Communication Communication Communication: No difficulties   Cognition Arousal/Alertness:  Awake/alert Behavior During Therapy: WFL for tasks assessed/performed Overall Cognitive Status: History of cognitive impairments - at baseline                                 General Comments: History of dementia noted per chart.   General Comments       Exercises     Shoulder Instructions      Home Living Family/patient expects to be discharged to:: Assisted living Living Arrangements: Other (Comment) Available Help at Discharge: Personal care attendant;Available PRN/intermittently Type of Home: Independent living facility                       Home Equipment: Gilford Rile - 4 wheels;Shower seat;Walker - 2 wheels          Prior Functioning/Environment Level of Independence: Independent with assistive device(s)        Comments: uses Rollator, son reports no h/o falls, walks to dining hall for lunch        OT Problem List: Decreased strength;Impaired balance (sitting and/or standing);Decreased activity tolerance;Decreased knowledge of use of DME or AE;Decreased knowledge of precautions;Pain      OT Treatment/Interventions: Self-care/ADL training;Therapeutic exercise;Energy conservation;DME and/or AE instruction;Therapeutic activities;Patient/family education;Balance training    OT Goals(Current goals can be found in the care plan section) Acute Rehab OT Goals Patient Stated Goal: not stated OT Goal Formulation: With patient/family Time For Goal Achievement: 02/10/21 Potential to Achieve Goals: Good  OT Frequency: Min 2X/week   Barriers to D/C:            Co-evaluation PT/OT/SLP Co-Evaluation/Treatment: Yes Reason for Co-Treatment: For patient/therapist safety   OT goals addressed during session: ADL's and self-care      AM-PAC OT "6 Clicks" Daily Activity     Outcome Measure Help from another person eating meals?: None Help from another person taking care of personal grooming?: None Help from another person toileting, which includes  using toliet, bedpan, or urinal?: A Little Help from another person bathing (including washing, rinsing, drying)?: A Little Help from another person to put on and taking off regular upper body clothing?: None Help from another person to put on and taking off regular lower body clothing?: A Little 6 Click Score: 21   End of Session Equipment Utilized During Treatment: Gait belt;Rolling walker  Activity Tolerance: Patient tolerated treatment well Patient left: in chair;with call bell/phone within reach;with chair alarm set;with family/visitor present  OT Visit Diagnosis: Unsteadiness on feet (R26.81);Muscle weakness (generalized) (M62.81);Pain                Time: 6834-1962 OT Time Calculation (min): 39 min Charges:  OT General Charges $OT Visit: 1 Visit OT Evaluation $OT Eval Moderate Complexity: Golden, OT Acute Rehabilitation Services Pager: [phone] Office: [phone]   Hortencia Pilar 01/27/2021, 12:38 PM

## 2021-01-27 NOTE — Progress Notes (Addendum)
Progress Note:    Wanda Jones    SHF:[id number] DOB: 11-03-1945 DOA: 01/24/2021  PCP: Pcp, No    Brief Narrative:   75 year old female with a past medical history of dementia, type 2 diabetes mellitus, recurring UTIs, dyslipidemia.  Presented from independent living facility via EMS due to complaints of urinary symptoms and altered mental status.    Subjective:   Patient denies any abdominal pain nausea or vomiting.  No chest pain or shortness of breath.     Assessment and Plan:   Sepsis secondary to Proteus UTI/bacteremia Patient remains on ceftriaxone.  Blood cultures positive for gram-negative rods which grew out to be Proteus.  Blood cultures were repeated.  Noted to be afebrile.  Normal WBC.  Seems to be doing better.  Sepsis appears to have resolved. Sensitivities reviewed from both the urine and blood cultures.  Change over to cephalexin.  Acute metabolic encephalopathy Secondary to above.  Seems to have resolved.  No focal deficits.   Insulin-dependent diabetes mellitus/hypokalemia On Tresiba at home.  Currently on glargine insulin.  SSI.  CBGs noted to be poorly controlled.  HbA1c was 9.6 in September.  Consider increasing the dose of basal insulin. Replace potassium  Normocytic anemia Drop in hemoglobin is dilutional.  No evidence of overt bleeding.  Continue to monitor.  Hyperlipidemia Continue home rosuvastatin  History of dementia Continue home memantine, donepezil, venlafaxine    Other information:    DVT prophylaxis: Lovenox subcu Code Status: Full code Family Communication: None present Disposition: No family at bedside  Status is: Inpatient  Remains inpatient appropriate because:Altered mental status  Dispo: The patient is from: ILF              Anticipated d/c is to: ILF              Patient currently is not medically stable to d/c.   Difficult to place patient No    Consultants:   None    Objective:    Vitals:    01/26/21 1346 01/26/21 1801 01/26/21 2000 01/27/21 0526  BP: 121/65 126/62  139/62  Pulse: 92 82  71  Resp: 18 18 (!) 22 17  Temp: 98.5 F (36.9 C) 98.3 F (36.8 C)  98.3 F (36.8 C)  TempSrc: Oral Oral  Oral  SpO2: 98% 100%  98%  Weight:      Height:        Intake/Output Summary (Last 24 hours) at 01/27/2021 1217 Last data filed at 01/27/2021 1054 Gross per 24 hour  Intake 340 ml  Output 400 ml  Net -60 ml    Filed Weights   01/24/21 1314 01/25/21 1823  Weight: 59 kg 59.6 kg       Physical Exam:    General appearance: Awake alert.  In no distress.  Mildly distracted Resp: Clear to auscultation bilaterally.  Normal effort Cardio: S1-S2 is normal regular.  No S3-S4.  No rubs murmurs or bruit GI: Abdomen is soft.  Nontender nondistended.  Bowel sounds are present normal.  No masses organomegaly Extremities: No edema.  Full range of motion of lower extremities. Neurologic:  No focal neurological deficits.     Data Reviewed:    I have personally reviewed following labs and imaging studies  CBC: Recent Labs  Lab 01/24/21 1318 01/25/21 0624 01/27/21 0344  WBC 15.2* 13.8* 6.5  NEUTROABS 12.9* 11.8*  --   HGB 12.2 12.0 10.8*  HCT 38.2 38.1 32.3*  MCV 89.7 89.0 86.8  PLT 397 295 207     Basic Metabolic Panel: Recent Labs  Lab 01/24/21 1318 01/25/21 0624 01/26/21 0335 01/27/21 0344  NA 138 137 136 135  K 3.6 3.6 3.5 3.2*  CL 101 101 101 100  CO2 28 23 23 25   GLUCOSE 145* 170* 210* 275*  BUN 18 17 20 21   CREATININE 1.09* 0.96 1.03* 0.91  CALCIUM 9.0 9.1 8.7* 8.5*     GFR: Estimated Creatinine Clearance: 44.2 mL/min (by C-G formula based on SCr of 0.91 mg/dL).  Liver Function Tests: Recent Labs  Lab 01/24/21 1318 01/25/21 0624  AST 17 17  ALT 13 14  ALKPHOS 91 79  BILITOT 0.8 0.7  PROT 7.3 7.0  ALBUMIN 3.4* 3.0*     CBG: Recent Labs  Lab 01/26/21 0736 01/26/21 1148 01/26/21 1659 01/27/21 0736 01/27/21 1104  GLUCAP 230* 266*  225* 229* 311*      Recent Results (from the past 240 hour(s))  Culture, blood (Routine x 2)     Status: None (Preliminary result)   Collection Time: 01/24/21  1:18 PM   Specimen: BLOOD  Result Value Ref Range Status   Specimen Description   Final    BLOOD RIGHT ANTECUBITAL Performed at [facility], [place] [address] [place], [place] [address]    Special Requests   Final    BOTTLES DRAWN AEROBIC AND ANAEROBIC Blood Culture adequate volume Performed at [place] [address] [place], [place] [address]    Culture   Final    NO GROWTH 3 DAYS Performed at [facility], [place] [address], [place], [place] [address]    Report Status PENDING  Incomplete  Culture, blood (Routine x 2)     Status: Abnormal   Collection Time: 01/24/21  1:23 PM   Specimen: BLOOD  Result Value Ref Range Status   Specimen Description   Final    BLOOD BLOOD RIGHT HAND Performed at [place] [address] [place], [place] [address]    Special Requests   Final    BOTTLES DRAWN AEROBIC AND ANAEROBIC Blood Culture adequate volume Performed at [place] [address] [place], [place] [address]    Culture  Setup Time   Final    GRAM NEGATIVE RODS IN BOTH AEROBIC AND ANAEROBIC BOTTLES Organism ID to follow CRITICAL RESULT CALLED TO, READ BACK BY AND VERIFIED WITH: Kittie Plater [facility] 01/25/21 6568 JDW Performed at [facility], [address] [place], [place] [address]    Culture PROTEUS MIRABILIS (A)  Final   Report Status 01/27/2021 FINAL  Final   Organism ID, Bacteria PROTEUS MIRABILIS  Final      Susceptibility   Proteus mirabilis - MIC*    AMPICILLIN <=2 SENSITIVE Sensitive     CEFAZOLIN <=4 SENSITIVE Sensitive     CEFEPIME <=0.12 SENSITIVE Sensitive     CEFTAZIDIME <=1 SENSITIVE Sensitive     CEFTRIAXONE <=0.25 SENSITIVE Sensitive     CIPROFLOXACIN <=0.25 SENSITIVE Sensitive     GENTAMICIN <=1  SENSITIVE Sensitive     IMIPENEM 2 SENSITIVE Sensitive     TRIMETH/SULFA <=20 SENSITIVE Sensitive     AMPICILLIN/SULBACTAM <=2 SENSITIVE Sensitive     PIP/TAZO <=4 SENSITIVE Sensitive     * PROTEUS MIRABILIS  Blood Culture ID Panel (Reflexed)     Status: Abnormal   Collection Time: 01/24/21  1:23 PM  Result Value Ref Range Status   Enterococcus faecalis NOT  DETECTED NOT DETECTED Final   Enterococcus Faecium NOT DETECTED NOT DETECTED Final   Listeria monocytogenes NOT DETECTED NOT DETECTED Final   Staphylococcus species NOT DETECTED NOT DETECTED Final   Staphylococcus aureus (BCID) NOT DETECTED NOT DETECTED Final   Staphylococcus epidermidis NOT DETECTED NOT DETECTED Final   Staphylococcus lugdunensis NOT DETECTED NOT DETECTED Final   Streptococcus species NOT DETECTED NOT DETECTED Final   Streptococcus agalactiae NOT DETECTED NOT DETECTED Final   Streptococcus pneumoniae NOT DETECTED NOT DETECTED Final   Streptococcus pyogenes NOT DETECTED NOT DETECTED Final   A.calcoaceticus-baumannii NOT DETECTED NOT DETECTED Final   Bacteroides fragilis NOT DETECTED NOT DETECTED Final   Enterobacterales DETECTED (A) NOT DETECTED Final    Comment: Enterobacterales represent a large order of gram negative bacteria, not a single organism. CRITICAL RESULT CALLED TO, READ BACK BY AND VERIFIED WITH: Kittie Plater [facility] 01/25/21 0835 JDW    Enterobacter cloacae complex NOT DETECTED NOT DETECTED Final   Escherichia coli NOT DETECTED NOT DETECTED Final   Klebsiella aerogenes NOT DETECTED NOT DETECTED Final   Klebsiella oxytoca NOT DETECTED NOT DETECTED Final   Klebsiella pneumoniae NOT DETECTED NOT DETECTED Final   Proteus species DETECTED (A) NOT DETECTED Final    Comment: CRITICAL RESULT CALLED TO, READ BACK BY AND VERIFIED WITH: Kittie Plater [facility] 01/25/21 0835 JDW    Salmonella species NOT DETECTED NOT DETECTED Final   Serratia marcescens NOT DETECTED NOT DETECTED Final   Haemophilus influenzae NOT  DETECTED NOT DETECTED Final   Neisseria meningitidis NOT DETECTED NOT DETECTED Final   Pseudomonas aeruginosa NOT DETECTED NOT DETECTED Final   Stenotrophomonas maltophilia NOT DETECTED NOT DETECTED Final   Candida albicans NOT DETECTED NOT DETECTED Final   Candida auris NOT DETECTED NOT DETECTED Final   Candida glabrata NOT DETECTED NOT DETECTED Final   Candida krusei NOT DETECTED NOT DETECTED Final   Candida parapsilosis NOT DETECTED NOT DETECTED Final   Candida tropicalis NOT DETECTED NOT DETECTED Final   Cryptococcus neoformans/gattii NOT DETECTED NOT DETECTED Final   CTX-M ESBL NOT DETECTED NOT DETECTED Final   Carbapenem resistance IMP NOT DETECTED NOT DETECTED Final   Carbapenem resistance KPC NOT DETECTED NOT DETECTED Final   Carbapenem resistance NDM NOT DETECTED NOT DETECTED Final   Carbapenem resist OXA 48 LIKE NOT DETECTED NOT DETECTED Final   Carbapenem resistance VIM NOT DETECTED NOT DETECTED Final    Comment: Performed at [facility], [address] [place], [place] [address]  Resp Panel by RT-PCR (Flu A&B, Covid) Nasopharyngeal Swab     Status: None   Collection Time: 01/24/21  1:28 PM   Specimen: Nasopharyngeal Swab; Nasopharyngeal(NP) swabs in vial transport medium  Result Value Ref Range Status   SARS Coronavirus 2 by RT PCR NEGATIVE NEGATIVE Final    Comment: (NOTE) SARS-CoV-2 target nucleic acids are NOT DETECTED.  The SARS-CoV-2 RNA is generally detectable in upper respiratory specimens during the acute phase of infection. The lowest concentration of SARS-CoV-2 viral copies this assay can detect is 138 copies/mL. A negative result does not preclude SARS-Cov-2 infection and should not be used as the sole basis for treatment or other patient management decisions. A negative result may occur with  improper specimen collection/handling, submission of specimen other than nasopharyngeal swab, presence of viral mutation(s) within the areas targeted by this  assay, and inadequate number of viral copies(<138 copies/mL). A negative result must be combined with clinical observations, patient history, and epidemiological information. The expected result is Negative.  Fact Sheet for Patients:  EntrepreneurPulse.com.au  Fact Sheet for Healthcare Providers:  IncredibleEmployment.be  This test is no t yet approved or cleared by the [place] FDA and  has been authorized for detection and/or diagnosis of SARS-CoV-2 by FDA under an Emergency Use Authorization (EUA). This EUA will remain  in effect (meaning this test can be used) for the duration of the COVID-19 declaration under Section 564(b)(1) of the Act, 21 U.S.C.section 360bbb-3(b)(1), unless the authorization is terminated  or revoked sooner.       Influenza A by PCR NEGATIVE NEGATIVE Final   Influenza B by PCR NEGATIVE NEGATIVE Final    Comment: (NOTE) The Xpert Xpress SARS-CoV-2/FLU/RSV plus assay is intended as an aid in the diagnosis of influenza from Nasopharyngeal swab specimens and should not be used as a sole basis for treatment. Nasal washings and aspirates are unacceptable for Xpert Xpress SARS-CoV-2/FLU/RSV testing.  Fact Sheet for Patients: EntrepreneurPulse.com.au  Fact Sheet for Healthcare Providers: IncredibleEmployment.be  This test is not yet approved or cleared by the [place] FDA and has been authorized for detection and/or diagnosis of SARS-CoV-2 by FDA under an Emergency Use Authorization (EUA). This EUA will remain in effect (meaning this test can be used) for the duration of the COVID-19 declaration under Section 564(b)(1) of the Act, 21 U.S.C. section 360bbb-3(b)(1), unless the authorization is terminated or revoked.  Performed at [facility], [place] [address] [place], [place] [address]   Urine Culture     Status: Abnormal   Collection Time: 01/24/21  1:59  PM   Specimen: In/Out Cath Urine  Result Value Ref Range Status   Specimen Description   Final    IN/OUT CATH URINE Performed at [place] [address], [place], [place] [address]    Special Requests   Final    NONE Performed at [facility], [place] [address] [place], [place] [address]    Culture 60,000 COLONIES/mL PROTEUS MIRABILIS (A)  Final   Report Status 01/27/2021 FINAL  Final   Organism ID, Bacteria PROTEUS MIRABILIS (A)  Final      Susceptibility   Proteus mirabilis - MIC*    AMPICILLIN <=2 SENSITIVE Sensitive     CEFAZOLIN <=4 SENSITIVE Sensitive     CEFEPIME <=0.12 SENSITIVE Sensitive     CEFTRIAXONE <=0.25 SENSITIVE Sensitive     CIPROFLOXACIN <=0.25 SENSITIVE Sensitive     GENTAMICIN <=1 SENSITIVE Sensitive     IMIPENEM 4 SENSITIVE Sensitive     NITROFURANTOIN 128 RESISTANT Resistant     TRIMETH/SULFA <=20 SENSITIVE Sensitive     AMPICILLIN/SULBACTAM <=2 SENSITIVE Sensitive     PIP/TAZO <=4 SENSITIVE Sensitive     * 60,000 COLONIES/mL PROTEUS MIRABILIS          Radiology Studies:    No results found.      Medications:    Scheduled Meds:  sodium chloride   Intravenous Once   donepezil  5 mg Oral QHS   enoxaparin (LOVENOX) injection  40 mg Subcutaneous Q24H   insulin aspart  0-9 Units Subcutaneous TID WC   insulin glargine-yfgn  20 Units Subcutaneous Daily   levothyroxine  88 mcg Oral QAC breakfast   memantine  5 mg Oral BID   mirabegron ER  50 mg Oral Daily   rosuvastatin  5 mg Oral QHS   venlafaxine XR  150 mg Oral Q breakfast   Continuous Infusions:  cefTRIAXone (ROCEPHIN)  IV 2 g (01/26/21 1512)  LOS: 3 days     Bonnielee Haff, MD [facility]   01/27/2021, 12:17 PM

## 2021-01-27 NOTE — Progress Notes (Signed)
Inpatient Diabetes Program Recommendations  AACE/ADA: New Consensus Statement on Inpatient Glycemic Control (2015)  Target Ranges:  Prepandial:   less than 140 mg/dL      Peak postprandial:   less than 180 mg/dL (1-2 hours)      Critically ill patients:  140 - 180 mg/dL   Lab Results  Component Value Date   GLUCAP 217 (H) 01/27/2021   HGBA1C 9.6 (H) 01/03/2021    Review of Glycemic Control  Diabetes history: DM2 Outpatient Diabetes medications: Tresiba 20 units QD, Novolog 4-5-6 TID with meals + 1-3 units s/s TID Current orders for Inpatient glycemic control: Semglee 24 units QD, Novolog 0-9 units TID  HgbA1C - 9.6%  Inpatient Diabetes Program Recommendations:    Add Novolog 3 units TID with meals  Continue to follow.  Thank you. Lorenda Peck, RD, LDN, CDE Inpatient Diabetes Coordinator [phone]

## 2021-01-27 NOTE — Plan of Care (Signed)

## 2021-01-27 NOTE — Evaluation (Signed)
Physical Therapy Evaluation Patient Details Name: Wanda Jones MRN: [id number] DOB: 1945-12-02 Today's Date: 01/27/2021  History of Present Illness  Pt is a 75 y.o. F who presents with malaise, flank pain, suprpubic abdominal pain, dysuria, rigors and hypergylcemia. Admitted with sepsis secondary to UTI/cellulitis. Significant PMH: three vessel CAD, type 1 diabetes mellitus, HTN, dementia, breast CA.    Clinical Impression  Wanda Jones is 75 y.o. female admitted with above HPI and diagnosis. Patient is currently limited by functional impairments below (see PT problem list). Patient lives at [facility] and is independent with rollator at baseline. Patient currently requires min assist for transfers and gait with RW. Pt struggled with RW management and anticipate she will have improved balance with rollator. Patient will benefit from continued skilled PT interventions to address impairments and progress independence with mobility, recommending return to ILF with HHPT. Acute PT will follow and progress as able.        Recommendations for follow up therapy are one component of a multi-disciplinary discharge planning process, led by the attending physician.  Recommendations may be updated based on patient status, additional functional criteria and insurance authorization.  Follow Up Recommendations Home health PT;Supervision for mobility/OOB    Equipment Recommendations  None recommended by PT    Recommendations for Other Services       Precautions / Restrictions Precautions Precautions: Fall Restrictions Weight Bearing Restrictions: No      Mobility  Bed Mobility Overal bed mobility: Needs Assistance Bed Mobility: Supine to Sit     Supine to sit: Min assist;HOB elevated     General bed mobility comments: Pt taking extra time and verbal/tactile cues needed for sequencing and reaching fully to bed rail. assist to fully bring LE's off EOB with use of bed pad to pivot hips.     Transfers Overall transfer level: Needs assistance Equipment used: Rolling walker (2 wheeled) Transfers: Sit to/from Stand Sit to Stand: Min assist         General transfer comment: pt using bil UE's at EOB for power up, assist to complete rise and steady once standing.  Ambulation/Gait Ambulation/Gait assistance: Min guard;Min assist Gait Distance (Feet): 60 Feet Assistive device: Rolling walker (2 wheeled) Gait Pattern/deviations: Step-through pattern;Decreased step length - right;Decreased step length - left;Decreased stride length;Shuffle;Trunk flexed;Drifts right/left Gait velocity: decr   General Gait Details: pt with decreased hip/knee flexion and reduce foot clearance with gait. short steps and pt required cues to stay close to RW. pt tending to drift to Rt during gait and assist needed to redirect/navigate turns and straight forward gait.  Stairs            Wheelchair Mobility    Modified Rankin (Stroke Patients Only)       Balance Overall balance assessment: Needs assistance Sitting-balance support: Feet supported Sitting balance-Leahy Scale: Fair     Standing balance support: Bilateral upper extremity supported Standing balance-Leahy Scale: Poor Standing balance comment: reliant on external support                             Pertinent Vitals/Pain Pain Assessment: Faces Faces Pain Scale: Hurts a little bit Pain Location: back Pain Descriptors / Indicators: Discomfort Pain Intervention(s): Limited activity within patient's tolerance;Monitored during session;Repositioned    Home Living Family/patient expects to be discharged to:: Assisted living Living Arrangements: Other (Comment) Available Help at Discharge: Personal care attendant;Available PRN/intermittently Type of Home: Independent living facility  Home Equipment: [place] - 4 wheels;Shower seat;Walker - 2 wheels;Grab bars - tub/shower;Hand held shower head Additional  Comments: pt has walkin shower with seat, grab bars, hand held shower. pt uses rollator for mobility.    Prior Function Level of Independence: Independent with assistive device(s)         Comments: uses Rollator, son reports no h/o falls, walks to dining hall for lunch     Hand Dominance        Extremity/Trunk Assessment   Upper Extremity Assessment Upper Extremity Assessment: Defer to OT evaluation;Generalized weakness    Lower Extremity Assessment Lower Extremity Assessment: Generalized weakness    Cervical / Trunk Assessment Cervical / Trunk Assessment: Kyphotic  Communication   Communication: No difficulties  Cognition Arousal/Alertness: Awake/alert Behavior During Therapy: WFL for tasks assessed/performed Overall Cognitive Status: History of cognitive impairments - at baseline                                 General Comments: History of dementia noted per chart. pt pleasant and cooperative, limited short term memory with searching for room while ambulating.      General Comments      Exercises     Assessment/Plan    PT Assessment Patient needs continued PT services  PT Problem List Decreased strength;Decreased activity tolerance;Decreased mobility;Decreased balance;Decreased safety awareness       PT Treatment Interventions DME instruction;Gait training;Functional mobility training;Therapeutic activities;Therapeutic exercise;Balance training;Patient/family education    PT Goals (Current goals can be found in the Care Plan section)  Acute Rehab PT Goals Patient Stated Goal: none stated PT Goal Formulation: With patient Time For Goal Achievement: 01/19/21 Potential to Achieve Goals: Good    Frequency Min 3X/week   Barriers to discharge        Co-evaluation PT/OT/SLP Co-Evaluation/Treatment: Yes Reason for Co-Treatment: To address functional/ADL transfers PT goals addressed during session: Mobility/safety with mobility;Balance;Proper  use of DME OT goals addressed during session: ADL's and self-care       AM-PAC PT "6 Clicks" Mobility  Outcome Measure Help needed turning from your back to your side while in a flat bed without using bedrails?: None Help needed moving from lying on your back to sitting on the side of a flat bed without using bedrails?: A Little Help needed moving to and from a bed to a chair (including a wheelchair)?: A Little Help needed standing up from a chair using your arms (e.g., wheelchair or bedside chair)?: A Little Help needed to walk in hospital room?: A Little Help needed climbing 3-5 steps with a railing? : A Lot 6 Click Score: 18    End of Session Equipment Utilized During Treatment: Gait belt Activity Tolerance: Patient tolerated treatment well Patient left: in chair;with call bell/phone within reach;with chair alarm set;with family/visitor present Nurse Communication: Mobility status PT Visit Diagnosis: Unsteadiness on feet (R26.81);Muscle weakness (generalized) (M62.81);Difficulty in walking, not elsewhere classified (R26.2)    Time: 2671-2458 PT Time Calculation (min) (ACUTE ONLY): 26 min   Charges:   PT Evaluation $PT Eval Low Complexity: 1 Low PT Treatments $Gait Training: 8-22 mins        Verner Mould, DPT Acute Rehabilitation Services Office [phone] Pager [phone]   Jacques Navy 01/27/2021, 2:01 PM

## 2021-01-28 LAB — BASIC METABOLIC PANEL
Anion gap: 8 (ref 5–15)
BUN: 17 mg/dL (ref 8–23)
CO2: 28 mmol/L (ref 22–32)
Calcium: 9.2 mg/dL (ref 8.9–10.3)
Chloride: 105 mmol/L (ref 98–111)
Creatinine, Ser: 0.91 mg/dL (ref 0.44–1.00)
GFR, Estimated: >60 mL/min (ref >60)
Glucose, Bld: 263 mg/dL — ABNORMAL HIGH (ref 70–99)
Potassium: 3.6 mmol/L (ref 3.5–5.1)
Sodium: 141 mmol/L (ref 135–145)

## 2021-01-28 LAB — CBC
HCT: 35.6 % — ABNORMAL LOW (ref 36.0–46.0)
Hemoglobin: 11.7 g/dL — ABNORMAL LOW (ref 12.0–15.0)
MCH: 28.2 pg (ref 26.0–34.0)
MCHC: 32.9 g/dL (ref 30.0–36.0)
MCV: 85.8 fL (ref 80.0–100.0)
Platelets: 210 10*3/uL (ref 150–400)
RBC: 4.15 MIL/uL (ref 3.87–5.11)
RDW: 12.5 % (ref 11.5–15.5)
WBC: 6.8 10*3/uL (ref 4.0–10.5)
nRBC: 0 % (ref 0.0–0.2)

## 2021-01-28 LAB — GLUCOSE, CAPILLARY
Glucose-Capillary: 234 mg/dL — ABNORMAL HIGH (ref 70–99)
Glucose-Capillary: 341 mg/dL — ABNORMAL HIGH (ref 70–99)

## 2021-01-28 LAB — MAGNESIUM: Magnesium: 1.8 mg/dL (ref 1.7–2.4)

## 2021-01-28 MED ORDER — TRESIBA FLEXTOUCH 100 UNIT/ML ~~LOC~~ SOPN: 24.0000 [IU] | PEN_INJECTOR | Freq: Every day | SUBCUTANEOUS | Status: DC

## 2021-01-28 MED ORDER — CEPHALEXIN 500 MG PO CAPS: 500.0000 mg | ORAL_CAPSULE | Freq: Four times a day (QID) | ORAL | 0 refills | Status: AC

## 2021-01-28 NOTE — Discharge Summary (Signed)
[facility]  Physician Discharge Summary   Patient ID: Wanda Jones MRN: [id number] DOB/AGE: 76-04-1945 76 y.o.  Admit date: 01/24/2021 Discharge date: 01/28/2021    PCP: Pcp, No  DISCHARGE DIAGNOSES:  Urinary tract infection with Proteus Proteus bacteremia Sepsis secondary to Proteus, present on admission Acute metabolic encephalopathy, resolved Insulin-dependent diabetes mellitus Normocytic anemia History of dementia  RECOMMENDATIONS FOR OUTPATIENT FOLLOW UP: Outpatient follow-up with PCP   Home Health: PT and OT Equipment/Devices: None  CODE STATUS: Full code  DISCHARGE CONDITION: fair  Diet recommendation: As before  INITIAL HISTORY: 75 year old female with a past medical history of dementia, type 2 diabetes mellitus, recurring UTIs, dyslipidemia.  Presented from independent living facility via EMS due to complaints of urinary symptoms and altered mental status.   HOSPITAL COURSE:   Sepsis secondary to Proteus UTI/bacteremia Patient was hospitalized and placed on ceftriaxone.   Blood cultures positive for gram-negative rods which grew out to be Proteus.  Blood cultures were repeated and noted to be negative.  Patient started improving.  Sepsis resolved.  Encephalopathy improved.  Sensitivities reviewed from both urine and blood cultures.  Patient was changed over to cephalexin and was discharged on the same.     Acute metabolic encephalopathy Secondary to above.  Seems to have resolved.     Insulin-dependent diabetes mellitus/hypokalemia Resume home medications.   Normocytic anemia Drop in hemoglobin is dilutional.  No evidence of overt bleeding.     Hyperlipidemia Continue home rosuvastatin   History of dementia Continue home memantine, donepezil, venlafaxine   Patient is stable.  Okay for discharge home.   PERTINENT LABS:  The results of significant diagnostics from this hospitalization (including imaging, microbiology, ancillary and  laboratory) are listed below for reference.    Microbiology: Recent Results (from the past 240 hour(s))  Culture, blood (Routine x 2)     Status: None   Collection Time: 01/24/21  1:18 PM   Specimen: BLOOD  Result Value Ref Range Status   Specimen Description   Final    BLOOD RIGHT ANTECUBITAL Performed at [facility] [address] [place], [place] [address]    Special Requests   Final    BOTTLES DRAWN AEROBIC AND ANAEROBIC Blood Culture adequate volume Performed at [facility] [address] [place], [place] [address]    Culture   Final    NO GROWTH 5 DAYS Performed at [facility], [place] [address] [place], [place] [address]    Report Status 01/29/2021 FINAL  Final  Culture, blood (Routine x 2)     Status: Abnormal   Collection Time: 01/24/21  1:23 PM   Specimen: BLOOD  Result Value Ref Range Status   Specimen Description   Final    BLOOD BLOOD RIGHT HAND Performed at [facility] [address] [place], [place] [address]    Special Requests   Final    BOTTLES DRAWN AEROBIC AND ANAEROBIC Blood Culture adequate volume Performed at [facility] [address], [place], [place] [address]    Culture  Setup Time   Final    GRAM NEGATIVE RODS IN BOTH AEROBIC AND ANAEROBIC BOTTLES Organism ID to follow CRITICAL RESULT CALLED TO, READ BACK BY AND VERIFIED WITH: Kittie Plater [facility] 01/25/21 6962 JDW Performed at [facility], [address], [place], [place] [address]    Culture PROTEUS MIRABILIS (A)  Final   Report Status 01/27/2021 FINAL  Final   Organism ID,  Bacteria PROTEUS MIRABILIS  Final      Susceptibility   Proteus mirabilis - MIC*    AMPICILLIN <=2 SENSITIVE Sensitive     CEFAZOLIN <=4 SENSITIVE Sensitive     CEFEPIME <=0.12 SENSITIVE Sensitive     CEFTAZIDIME <=1 SENSITIVE Sensitive     CEFTRIAXONE <=0.25 SENSITIVE Sensitive     CIPROFLOXACIN <=0.25 SENSITIVE Sensitive      GENTAMICIN <=1 SENSITIVE Sensitive     IMIPENEM 2 SENSITIVE Sensitive     TRIMETH/SULFA <=20 SENSITIVE Sensitive     AMPICILLIN/SULBACTAM <=2 SENSITIVE Sensitive     PIP/TAZO <=4 SENSITIVE Sensitive     * PROTEUS MIRABILIS  Blood Culture ID Panel (Reflexed)     Status: Abnormal   Collection Time: 01/24/21  1:23 PM  Result Value Ref Range Status   Enterococcus faecalis NOT DETECTED NOT DETECTED Final   Enterococcus Faecium NOT DETECTED NOT DETECTED Final   Listeria monocytogenes NOT DETECTED NOT DETECTED Final   Staphylococcus species NOT DETECTED NOT DETECTED Final   Staphylococcus aureus (BCID) NOT DETECTED NOT DETECTED Final   Staphylococcus epidermidis NOT DETECTED NOT DETECTED Final   Staphylococcus lugdunensis NOT DETECTED NOT DETECTED Final   Streptococcus species NOT DETECTED NOT DETECTED Final   Streptococcus agalactiae NOT DETECTED NOT DETECTED Final   Streptococcus pneumoniae NOT DETECTED NOT DETECTED Final   Streptococcus pyogenes NOT DETECTED NOT DETECTED Final   A.calcoaceticus-baumannii NOT DETECTED NOT DETECTED Final   Bacteroides fragilis NOT DETECTED NOT DETECTED Final   Enterobacterales DETECTED (A) NOT DETECTED Final    Comment: Enterobacterales represent a large order of gram negative bacteria, not a single organism. CRITICAL RESULT CALLED TO, READ BACK BY AND VERIFIED WITH: Kittie Plater [facility] 01/25/21 0835 JDW    Enterobacter cloacae complex NOT DETECTED NOT DETECTED Final   Escherichia coli NOT DETECTED NOT DETECTED Final   Klebsiella aerogenes NOT DETECTED NOT DETECTED Final   Klebsiella oxytoca NOT DETECTED NOT DETECTED Final   Klebsiella pneumoniae NOT DETECTED NOT DETECTED Final   Proteus species DETECTED (A) NOT DETECTED Final    Comment: CRITICAL RESULT CALLED TO, READ BACK BY AND VERIFIED WITH: Kittie Plater [facility] 01/25/21 0835 JDW    Salmonella species NOT DETECTED NOT DETECTED Final   Serratia marcescens NOT DETECTED NOT DETECTED Final   Haemophilus  influenzae NOT DETECTED NOT DETECTED Final   Neisseria meningitidis NOT DETECTED NOT DETECTED Final   Pseudomonas aeruginosa NOT DETECTED NOT DETECTED Final   Stenotrophomonas maltophilia NOT DETECTED NOT DETECTED Final   Candida albicans NOT DETECTED NOT DETECTED Final   Candida auris NOT DETECTED NOT DETECTED Final   Candida glabrata NOT DETECTED NOT DETECTED Final   Candida krusei NOT DETECTED NOT DETECTED Final   Candida parapsilosis NOT DETECTED NOT DETECTED Final   Candida tropicalis NOT DETECTED NOT DETECTED Final   Cryptococcus neoformans/gattii NOT DETECTED NOT DETECTED Final   CTX-M ESBL NOT DETECTED NOT DETECTED Final   Carbapenem resistance IMP NOT DETECTED NOT DETECTED Final   Carbapenem resistance KPC NOT DETECTED NOT DETECTED Final   Carbapenem resistance NDM NOT DETECTED NOT DETECTED Final   Carbapenem resist OXA 48 LIKE NOT DETECTED NOT DETECTED Final   Carbapenem resistance VIM NOT DETECTED NOT DETECTED Final    Comment: Performed at [facility], [address] [place], [place] [address]  Resp Panel by RT-PCR (Flu A&B, Covid) Nasopharyngeal Swab     Status: None   Collection Time: 01/24/21  1:28 PM   Specimen: Nasopharyngeal Swab; Nasopharyngeal(NP) swabs  in vial transport medium  Result Value Ref Range Status   SARS Coronavirus 2 by RT PCR NEGATIVE NEGATIVE Final    Comment: (NOTE) SARS-CoV-2 target nucleic acids are NOT DETECTED.  The SARS-CoV-2 RNA is generally detectable in upper respiratory specimens during the acute phase of infection. The lowest concentration of SARS-CoV-2 viral copies this assay can detect is 138 copies/mL. A negative result does not preclude SARS-Cov-2 infection and should not be used as the sole basis for treatment or other patient management decisions. A negative result may occur with  improper specimen collection/handling, submission of specimen other than nasopharyngeal swab, presence of viral mutation(s) within the areas  targeted by this assay, and inadequate number of viral copies(<138 copies/mL). A negative result must be combined with clinical observations, patient history, and epidemiological information. The expected result is Negative.  Fact Sheet for Patients:  EntrepreneurPulse.com.au  Fact Sheet for Healthcare Providers:  IncredibleEmployment.be  This test is no t yet approved or cleared by the [place] FDA and  has been authorized for detection and/or diagnosis of SARS-CoV-2 by FDA under an Emergency Use Authorization (EUA). This EUA will remain  in effect (meaning this test can be used) for the duration of the COVID-19 declaration under Section 564(b)(1) of the Act, 21 U.S.C.section 360bbb-3(b)(1), unless the authorization is terminated  or revoked sooner.       Influenza A by PCR NEGATIVE NEGATIVE Final   Influenza B by PCR NEGATIVE NEGATIVE Final    Comment: (NOTE) The Xpert Xpress SARS-CoV-2/FLU/RSV plus assay is intended as an aid in the diagnosis of influenza from Nasopharyngeal swab specimens and should not be used as a sole basis for treatment. Nasal washings and aspirates are unacceptable for Xpert Xpress SARS-CoV-2/FLU/RSV testing.  Fact Sheet for Patients: EntrepreneurPulse.com.au  Fact Sheet for Healthcare Providers: IncredibleEmployment.be  This test is not yet approved or cleared by the [place] FDA and has been authorized for detection and/or diagnosis of SARS-CoV-2 by FDA under an Emergency Use Authorization (EUA). This EUA will remain in effect (meaning this test can be used) for the duration of the COVID-19 declaration under Section 564(b)(1) of the Act, 21 U.S.C. section 360bbb-3(b)(1), unless the authorization is terminated or revoked.  Performed at [facility], [place] [address] [place], [place] [address]   Urine Culture     Status: Abnormal   Collection  Time: 01/24/21  1:59 PM   Specimen: In/Out Cath Urine  Result Value Ref Range Status   Specimen Description   Final    IN/OUT CATH URINE Performed at [facility] [address], [place], [place] [address]    Special Requests   Final    NONE Performed at [facility], [place] [address], [place], [place] [address]    Culture 60,000 COLONIES/mL PROTEUS MIRABILIS (A)  Final   Report Status 01/27/2021 FINAL  Final   Organism ID, Bacteria PROTEUS MIRABILIS (A)  Final      Susceptibility   Proteus mirabilis - MIC*    AMPICILLIN <=2 SENSITIVE Sensitive     CEFAZOLIN <=4 SENSITIVE Sensitive     CEFEPIME <=0.12 SENSITIVE Sensitive     CEFTRIAXONE <=0.25 SENSITIVE Sensitive     CIPROFLOXACIN <=0.25 SENSITIVE Sensitive     GENTAMICIN <=1 SENSITIVE Sensitive     IMIPENEM 4 SENSITIVE Sensitive     NITROFURANTOIN 128 RESISTANT Resistant     TRIMETH/SULFA <=20 SENSITIVE Sensitive     AMPICILLIN/SULBACTAM <=2 SENSITIVE Sensitive  PIP/TAZO <=4 SENSITIVE Sensitive     * 60,000 COLONIES/mL PROTEUS MIRABILIS  Culture, blood (Routine X 2) w Reflex to ID Panel     Status: None (Preliminary result)   Collection Time: 01/26/21 10:40 PM   Specimen: BLOOD LEFT HAND  Result Value Ref Range Status   Specimen Description   Final    BLOOD LEFT HAND Performed at [facility] [address], [place], [place] [address]    Special Requests   Final    BOTTLES DRAWN AEROBIC ONLY Blood Culture adequate volume Performed at [facility] [address], [place], [place] [address]    Culture   Final    NO GROWTH 2 DAYS Performed at [facility] [address], [name], [place] [address]    Report Status PENDING  Incomplete  Culture, blood (Routine X 2) w Reflex to ID Panel     Status: None (Preliminary result)   Collection Time: 01/26/21 10:40 PM   Specimen: BLOOD RIGHT HAND  Result Value Ref Range Status   Specimen  Description   Final    BLOOD RIGHT HAND Performed at [facility] [address] [place], [place] [address]    Special Requests   Final    BOTTLES DRAWN AEROBIC ONLY Blood Culture adequate volume Performed at [facility] [address] [place], [place] [address]    Culture   Final    NO GROWTH 2 DAYS Performed at [facility] [address] [place], [place] [address]    Report Status PENDING  Incomplete     Labs:  COVID-19 Labs  Lab Results  Component Value Date   [place] NEGATIVE 01/24/2021   [place] NEGATIVE 01/02/2021      Basic Metabolic Panel: Recent Labs  Lab 01/24/21 1318 01/25/21 0624 01/26/21 0335 01/27/21 0344 01/28/21 0335  NA 138 137 136 135 141  K 3.6 3.6 3.5 3.2* 3.6  CL 101 101 101 100 105  CO2 28 23 23 25 28   GLUCOSE 145* 170* 210* 275* 263*  BUN 18 17 20 21 17   CREATININE 1.09* 0.96 1.03* 0.91 0.91  CALCIUM 9.0 9.1 8.7* 8.5* 9.2  MG  --   --   --   --  1.8   Liver Function Tests: Recent Labs  Lab 01/24/21 1318 01/25/21 0624  AST 17 17  ALT 13 14  ALKPHOS 91 79  BILITOT 0.8 0.7  PROT 7.3 7.0  ALBUMIN 3.4* 3.0*    CBC: Recent Labs  Lab 01/24/21 1318 01/25/21 0624 01/27/21 0344 01/28/21 0335  WBC 15.2* 13.8* 6.5 6.8  NEUTROABS 12.9* 11.8*  --   --   HGB 12.2 12.0 10.8* 11.7*  HCT 38.2 38.1 32.3* 35.6*  MCV 89.7 89.0 86.8 85.8  PLT 397 295 207 210    CBG: Recent Labs  Lab 01/27/21 1104 01/27/21 1608 01/27/21 2116 01/28/21 0729 01/28/21 1154  GLUCAP 311* 250* 217* 234* 341*     IMAGING STUDIES DG Chest 2 View  Result Date: 01/24/2021 CLINICAL DATA:  Suspected sepsis EXAM: CHEST - 2 VIEW COMPARISON:  None. FINDINGS: The heart size and mediastinal contours are within normal limits. Both lungs are clear. 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 Surgical clips are noted in the right axilla. Patient status post prior lower T-spine vertebral plasty.  IMPRESSION: No active cardiopulmonary disease. Electronically Signed   By: Abelardo Diesel M.D.   On: 01/24/2021 14:34  DISCHARGE EXAMINATION: Vitals:   01/27/21 0526 01/27/21 1300 01/27/21 2037 01/28/21 0624  BP: 139/62 137/63 133/64 140/73  Pulse: 71 76 75 94  Resp: 17  15 18   Temp: 98.3 F (36.8 C) 98.3 F (36.8 C) 98.4 F (36.9 C) (!) 97.5 F (36.4 C)  TempSrc: Oral Oral Oral Oral  SpO2: 98% 96% 99% 95%  Weight:      Height:       General appearance: Awake alert.  In no distress Resp: Clear to auscultation bilaterally.  Normal effort Cardio: S1-S2 is normal regular.  No S3-S4.  No rubs murmurs or bruit GI: Abdomen is soft.  Nontender nondistended.  Bowel sounds are present normal.  No masses organomegaly    DISPOSITION: Home  Discharge Instructions     Call MD for:  difficulty breathing, headache or visual disturbances   Complete by: As directed    Call MD for:  extreme fatigue   Complete by: As directed    Call MD for:  persistant dizziness or light-headedness   Complete by: As directed    Call MD for:  persistant nausea and vomiting   Complete by: As directed    Call MD for:  severe uncontrolled pain   Complete by: As directed    Call MD for:  temperature >100.4   Complete by: As directed    Diet - low sodium heart healthy   Complete by: As directed    Discharge instructions   Complete by: As directed    Please take your medications as prescribed.  Follow-up with primary care provider within 1 week.  You were cared for by a hospitalist during your hospital stay. If you have any questions about your discharge medications or the care you received while you were in the hospital after you are discharged, you can call the unit and asked to speak with the hospitalist on call if the hospitalist that took care of you is not available. Once you are discharged, your primary care physician will handle any further medical issues. Please note that NO REFILLS for any discharge  medications will be authorized once you are discharged, as it is imperative that you return to your primary care physician (or establish a relationship with a primary care physician if you do not have one) for your aftercare needs so that they can reassess your need for medications and monitor your lab values. If you do not have a primary care physician, you can call [phone] for a physician referral.   Increase activity slowly   Complete by: As directed    No wound care   Complete by: As directed           Allergies as of 01/28/2021       Reactions   Codeine Anaphylaxis   Prednisone Anaphylaxis        Medication List     TAKE these medications    acetaminophen 325 MG tablet Commonly known as: TYLENOL Take 325 mg by mouth every 6 (six) hours as needed for moderate pain or headache.   cephALEXin 500 MG capsule Commonly known as: KEFLEX Take 1 capsule (500 mg total) by mouth every 6 (six) hours for 5 days. Notes to patient: 01/28/2021 2 more doses    cetirizine 10 MG tablet Commonly known as: ZYRTEC Take 10 mg by mouth daily. Notes to patient: 01/29/2021    clobetasol 0.05 % external solution Commonly known as: TEMOVATE Apply 1 application topically 2 (two) times daily as needed (irritation).  donepezil 5 MG tablet Commonly known as: ARICEPT Take 5 mg by mouth at bedtime. Notes to patient: 01/28/2021 bedtime    levothyroxine 88 MCG tablet Commonly known as: SYNTHROID Take 88 mcg by mouth daily before breakfast. Notes to patient: 01/29/2021    memantine 5 MG tablet Commonly known as: NAMENDA Take 5 mg by mouth 2 (two) times daily. Notes to patient: 01/28/2021 morning and bedtime    Myrbetriq 50 MG Tb24 tablet Generic drug: mirabegron ER Take 50 mg by mouth daily. Notes to patient: 01/29/2021    NOVOLOG FLEXPEN [place] Inject 0-6 Units into the skin See admin instructions. 4 UNITS AT BREAKFAST  5 UNITS AT LUNCH 6 UNITS AT DINNER.  ADD 1 UNIT IF BLOOD SUGAR  150-200;  ADD 2 UNITS IF 201-250;  ADD 3 UNITS IF >   rosuvastatin 5 MG tablet Commonly known as: CRESTOR Take 5 mg by mouth at bedtime. Notes to patient: 01/28/2021 bedtime    Tresiba FlexTouch 100 UNIT/ML FlexTouch Pen Generic drug: insulin degludec Inject 24 Units into the skin daily. What changed: how much to take Notes to patient: 01/29/2021    venlafaxine XR 150 MG 24 hr capsule Commonly known as: EFFEXOR-XR Take 150 mg by mouth daily with breakfast. Notes to patient: 01/29/2021    vitamin B-12 1000 MCG tablet Commonly known as: CYANOCOBALAMIN Take 1,000 mcg by mouth daily. Notes to patient: 01/29/2021            TOTAL DISCHARGE TIME: 35 minutes  Danyon Mcginness [employer] on [email or web address]  01/29/2021, 4:05 PM

## 2021-01-28 NOTE — Progress Notes (Signed)
Results for ALESHA, JAFFEE (MRN [id number]) as of 01/28/2021 12:29  Ref. Range 01/27/2021 11:04 01/27/2021 16:08 01/27/2021 21:16 01/28/2021 07:29 01/28/2021 11:54  Glucose-Capillary Latest Ref Range: 70 - 99 mg/dL 311 (H) 250 (H) 217 (H) 234 (H) 341 (H)  Noted that CBGs have continue to be greater than 180 mg/dl.  Recommend adding Novolog 3 units TID with meals if patient eats at least 50% of meal and the blood sugars continue to be elevated.   Harvel Ricks RN BSN CDE Diabetes Coordinator Pager: [phone]  8am-5pm

## 2021-01-28 NOTE — Plan of Care (Signed)
Discharge instructions reviewed with patient and son, questions answered, verbalized understanding.  Patient transported to main entrance via wheelchair to be taken back to [facility] independent living by son.

## 2021-01-28 NOTE — TOC Transition Note (Signed)
Transition of Care [facility]) - CM/SW Discharge Note   Patient Details  Name: Wanda Jones MRN: [id number] Date of Birth: 12/07/1945  [facility] CM/SW Contact:  Leeroy Cha, RN Phone Number: 01/28/2021, 10:46 AM   Clinical Narrative:    [facility] admission spoke with [facility] transferred to the physical therapy dept.  Prentiss Bells was made aware that pt will need physical therapy through their provider [facility].  No answer in the p.t. dept unable to leave message.  Will print and send orders with patient.   Final next level of care: [place] Barriers to Discharge: No Barriers Identified   Patient Goals and CMS Choice Patient states their goals for this hospitalization and ongoing recovery are:: to go home CMS Medicare.gov Compare Post Acute Care list provided to:: Patient Choice offered to / list presented to : Patient  Discharge Placement                       Discharge Plan and Services   Discharge Planning Services: CM Consult                        [facility] Agency: Other - See comment     Representative spoke with at [facility]: amber  Social Determinants of Health (SDOH) Interventions     Readmission Risk Interventions Readmission Risk Prevention Plan 01/04/2021  Post Dischage Appt Complete  Medication Screening Complete  Transportation Screening Complete

## 2021-01-29 DIAGNOSIS — R488 Other symbolic dysfunctions: Secondary | ICD-10-CM | POA: Diagnosis not present

## 2021-01-29 DIAGNOSIS — M6281 Muscle weakness (generalized): Secondary | ICD-10-CM | POA: Diagnosis not present

## 2021-01-29 DIAGNOSIS — N3946 Mixed incontinence: Secondary | ICD-10-CM | POA: Diagnosis not present

## 2021-01-29 DIAGNOSIS — R41841 Cognitive communication deficit: Secondary | ICD-10-CM | POA: Diagnosis not present

## 2021-01-29 DIAGNOSIS — R2681 Unsteadiness on feet: Secondary | ICD-10-CM | POA: Diagnosis not present

## 2021-01-29 DIAGNOSIS — R278 Other lack of coordination: Secondary | ICD-10-CM | POA: Diagnosis not present

## 2021-01-29 DIAGNOSIS — H5410 Blindness, one eye, low vision other eye, unspecified eyes: Secondary | ICD-10-CM | POA: Diagnosis not present

## 2021-01-29 LAB — CULTURE, BLOOD (ROUTINE X 2)
Culture: NO GROWTH
Special Requests: ADEQUATE

## 2021-02-01 DIAGNOSIS — N3946 Mixed incontinence: Secondary | ICD-10-CM | POA: Diagnosis not present

## 2021-02-01 DIAGNOSIS — M6281 Muscle weakness (generalized): Secondary | ICD-10-CM | POA: Diagnosis not present

## 2021-02-01 DIAGNOSIS — R2681 Unsteadiness on feet: Secondary | ICD-10-CM | POA: Diagnosis not present

## 2021-02-01 DIAGNOSIS — R278 Other lack of coordination: Secondary | ICD-10-CM | POA: Diagnosis not present

## 2021-02-01 DIAGNOSIS — Z8616 Personal history of COVID-19: Secondary | ICD-10-CM | POA: Diagnosis not present

## 2021-02-01 DIAGNOSIS — R41841 Cognitive communication deficit: Secondary | ICD-10-CM | POA: Diagnosis not present

## 2021-02-01 LAB — CULTURE, BLOOD (ROUTINE X 2)
Culture: NO GROWTH
Culture: NO GROWTH
Special Requests: ADEQUATE
Special Requests: ADEQUATE

## 2021-02-02 DIAGNOSIS — N3946 Mixed incontinence: Secondary | ICD-10-CM | POA: Diagnosis not present

## 2021-02-02 DIAGNOSIS — R2681 Unsteadiness on feet: Secondary | ICD-10-CM | POA: Diagnosis not present

## 2021-02-02 DIAGNOSIS — M6281 Muscle weakness (generalized): Secondary | ICD-10-CM | POA: Diagnosis not present

## 2021-02-02 DIAGNOSIS — R41841 Cognitive communication deficit: Secondary | ICD-10-CM | POA: Diagnosis not present

## 2021-02-02 DIAGNOSIS — R278 Other lack of coordination: Secondary | ICD-10-CM | POA: Diagnosis not present

## 2021-02-04 DIAGNOSIS — R278 Other lack of coordination: Secondary | ICD-10-CM | POA: Diagnosis not present

## 2021-02-04 DIAGNOSIS — R41841 Cognitive communication deficit: Secondary | ICD-10-CM | POA: Diagnosis not present

## 2021-02-04 DIAGNOSIS — Z09 Encounter for follow-up examination after completed treatment for conditions other than malignant neoplasm: Secondary | ICD-10-CM | POA: Diagnosis not present

## 2021-02-04 DIAGNOSIS — E039 Hypothyroidism, unspecified: Secondary | ICD-10-CM | POA: Diagnosis not present

## 2021-02-04 DIAGNOSIS — F0390 Unspecified dementia without behavioral disturbance: Secondary | ICD-10-CM | POA: Diagnosis not present

## 2021-02-04 DIAGNOSIS — N3946 Mixed incontinence: Secondary | ICD-10-CM | POA: Diagnosis not present

## 2021-02-04 DIAGNOSIS — R2681 Unsteadiness on feet: Secondary | ICD-10-CM | POA: Diagnosis not present

## 2021-02-04 DIAGNOSIS — L98411 Non-pressure chronic ulcer of buttock limited to breakdown of skin: Secondary | ICD-10-CM | POA: Diagnosis not present

## 2021-02-04 DIAGNOSIS — M6281 Muscle weakness (generalized): Secondary | ICD-10-CM | POA: Diagnosis not present

## 2021-02-05 DIAGNOSIS — N3946 Mixed incontinence: Secondary | ICD-10-CM | POA: Diagnosis not present

## 2021-02-05 DIAGNOSIS — R41841 Cognitive communication deficit: Secondary | ICD-10-CM | POA: Diagnosis not present

## 2021-02-05 DIAGNOSIS — M6281 Muscle weakness (generalized): Secondary | ICD-10-CM | POA: Diagnosis not present

## 2021-02-05 DIAGNOSIS — R2681 Unsteadiness on feet: Secondary | ICD-10-CM | POA: Diagnosis not present

## 2021-02-05 DIAGNOSIS — R278 Other lack of coordination: Secondary | ICD-10-CM | POA: Diagnosis not present

## 2021-02-07 DIAGNOSIS — L22 Diaper dermatitis: Secondary | ICD-10-CM | POA: Diagnosis not present

## 2021-02-07 DIAGNOSIS — M810 Age-related osteoporosis without current pathological fracture: Secondary | ICD-10-CM | POA: Diagnosis not present

## 2021-02-07 DIAGNOSIS — Z4800 Encounter for change or removal of nonsurgical wound dressing: Secondary | ICD-10-CM | POA: Diagnosis not present

## 2021-02-07 DIAGNOSIS — I2510 Atherosclerotic heart disease of native coronary artery without angina pectoris: Secondary | ICD-10-CM | POA: Diagnosis not present

## 2021-02-07 DIAGNOSIS — E109 Type 1 diabetes mellitus without complications: Secondary | ICD-10-CM | POA: Diagnosis not present

## 2021-02-07 DIAGNOSIS — I10 Essential (primary) hypertension: Secondary | ICD-10-CM | POA: Diagnosis not present

## 2021-02-07 DIAGNOSIS — Z7982 Long term (current) use of aspirin: Secondary | ICD-10-CM | POA: Diagnosis not present

## 2021-02-07 DIAGNOSIS — F0280 Dementia in other diseases classified elsewhere without behavioral disturbance: Secondary | ICD-10-CM | POA: Diagnosis not present

## 2021-02-07 DIAGNOSIS — E039 Hypothyroidism, unspecified: Secondary | ICD-10-CM | POA: Diagnosis not present

## 2021-02-08 DIAGNOSIS — Z8616 Personal history of COVID-19: Secondary | ICD-10-CM | POA: Diagnosis not present

## 2021-02-13 DIAGNOSIS — Z4800 Encounter for change or removal of nonsurgical wound dressing: Secondary | ICD-10-CM | POA: Diagnosis not present

## 2021-02-13 DIAGNOSIS — I2510 Atherosclerotic heart disease of native coronary artery without angina pectoris: Secondary | ICD-10-CM | POA: Diagnosis not present

## 2021-02-13 DIAGNOSIS — I10 Essential (primary) hypertension: Secondary | ICD-10-CM | POA: Diagnosis not present

## 2021-02-13 DIAGNOSIS — E109 Type 1 diabetes mellitus without complications: Secondary | ICD-10-CM | POA: Diagnosis not present

## 2021-02-13 DIAGNOSIS — M810 Age-related osteoporosis without current pathological fracture: Secondary | ICD-10-CM | POA: Diagnosis not present

## 2021-02-13 DIAGNOSIS — Z7982 Long term (current) use of aspirin: Secondary | ICD-10-CM | POA: Diagnosis not present

## 2021-02-13 DIAGNOSIS — F0280 Dementia in other diseases classified elsewhere without behavioral disturbance: Secondary | ICD-10-CM | POA: Diagnosis not present

## 2021-02-13 DIAGNOSIS — E039 Hypothyroidism, unspecified: Secondary | ICD-10-CM | POA: Diagnosis not present

## 2021-02-13 DIAGNOSIS — L22 Diaper dermatitis: Secondary | ICD-10-CM | POA: Diagnosis not present

## 2021-02-15 DIAGNOSIS — Z8616 Personal history of COVID-19: Secondary | ICD-10-CM | POA: Diagnosis not present

## 2021-02-16 DIAGNOSIS — M810 Age-related osteoporosis without current pathological fracture: Secondary | ICD-10-CM | POA: Diagnosis not present

## 2021-02-16 DIAGNOSIS — F0280 Dementia in other diseases classified elsewhere without behavioral disturbance: Secondary | ICD-10-CM | POA: Diagnosis not present

## 2021-02-16 DIAGNOSIS — E039 Hypothyroidism, unspecified: Secondary | ICD-10-CM | POA: Diagnosis not present

## 2021-02-16 DIAGNOSIS — I10 Essential (primary) hypertension: Secondary | ICD-10-CM | POA: Diagnosis not present

## 2021-02-16 DIAGNOSIS — Z7982 Long term (current) use of aspirin: Secondary | ICD-10-CM | POA: Diagnosis not present

## 2021-02-16 DIAGNOSIS — E109 Type 1 diabetes mellitus without complications: Secondary | ICD-10-CM | POA: Diagnosis not present

## 2021-02-16 DIAGNOSIS — I2510 Atherosclerotic heart disease of native coronary artery without angina pectoris: Secondary | ICD-10-CM | POA: Diagnosis not present

## 2021-02-16 DIAGNOSIS — L22 Diaper dermatitis: Secondary | ICD-10-CM | POA: Diagnosis not present

## 2021-02-16 DIAGNOSIS — Z4800 Encounter for change or removal of nonsurgical wound dressing: Secondary | ICD-10-CM | POA: Diagnosis not present

## 2021-02-19 ENCOUNTER — Encounter: Payer: Self-pay | Admitting: Endocrinology

## 2021-02-19 ENCOUNTER — Ambulatory Visit: Payer: Medicare HMO | Admitting: Endocrinology

## 2021-02-19 ENCOUNTER — Other Ambulatory Visit: Payer: Self-pay

## 2021-02-19 VITALS — BP 114/60 | HR 98 | Ht 63.0 in | Wt 133.8 lb

## 2021-02-19 DIAGNOSIS — E1069 Type 1 diabetes mellitus with other specified complication: Secondary | ICD-10-CM | POA: Diagnosis not present

## 2021-02-19 DIAGNOSIS — M810 Age-related osteoporosis without current pathological fracture: Secondary | ICD-10-CM | POA: Diagnosis not present

## 2021-02-19 DIAGNOSIS — I10 Essential (primary) hypertension: Secondary | ICD-10-CM | POA: Diagnosis not present

## 2021-02-19 DIAGNOSIS — E109 Type 1 diabetes mellitus without complications: Secondary | ICD-10-CM | POA: Diagnosis not present

## 2021-02-19 DIAGNOSIS — E039 Hypothyroidism, unspecified: Secondary | ICD-10-CM | POA: Diagnosis not present

## 2021-02-19 DIAGNOSIS — Z7982 Long term (current) use of aspirin: Secondary | ICD-10-CM | POA: Diagnosis not present

## 2021-02-19 DIAGNOSIS — I2510 Atherosclerotic heart disease of native coronary artery without angina pectoris: Secondary | ICD-10-CM | POA: Diagnosis not present

## 2021-02-19 DIAGNOSIS — Z4800 Encounter for change or removal of nonsurgical wound dressing: Secondary | ICD-10-CM | POA: Diagnosis not present

## 2021-02-19 DIAGNOSIS — L22 Diaper dermatitis: Secondary | ICD-10-CM | POA: Diagnosis not present

## 2021-02-19 DIAGNOSIS — F0280 Dementia in other diseases classified elsewhere without behavioral disturbance: Secondary | ICD-10-CM | POA: Diagnosis not present

## 2021-02-19 LAB — POCT GLYCOSYLATED HEMOGLOBIN (HGB A1C): Hemoglobin A1C: 9.2 % — AB (ref 4.0–5.6)

## 2021-02-19 MED ORDER — NOVOLOG FLEXPEN 100 UNIT/ML ~~LOC~~ SOPN: PEN_INJECTOR | SUBCUTANEOUS | 11 refills | Status: AC

## 2021-02-19 MED ORDER — TRESIBA FLEXTOUCH 100 UNIT/ML ~~LOC~~ SOPN: 18.0000 [IU] | PEN_INJECTOR | Freq: Every day | SUBCUTANEOUS | Status: DC

## 2021-02-19 NOTE — Progress Notes (Signed)
Subjective:    Patient ID: Wanda Jones, female    DOB: May 25, 1945, 75 y.o.   MRN: [id number]  HPI Husb provides some hx, due to pt's advanced age.  Pt lives at [facility], but she takes her own insulin, and operates her own continuous glucose monitor.  pt is referred by Clide Deutscher, NP, for diabetes.  Pt states DM was dx'ed in 6761; it is complicated by DR and CAD; she has been on insulin since dx; pt says his diet and exercise are good; she has never had GDM, pancreatitis, pancreatic surgery, or DKA.  Last episode of severe hypoglycemia was in mid-2022.  She takes tresiba 20/d, and Novolog 3 times a day (just before each meal) 4-5-5 units.  She has mild hypoglycemia approx once/week.  She eats meals at 9AM, 12PM, 5PM, and 9PM.  I reviewed continuous glucose monitor data.  Glucose varies from 65-400.  It is in general highest at 2AM and 2PM.  It is in general lowest at [place], and less so at [place].  It varies widely at all times of day.   Past Medical History:  Diagnosis Date   Dementia ([place]) 01/24/2021   Diabetes mellitus type 1 ([place])    History of recurrent UTIs    Hyperlipidemia 01/24/2021    No past surgical history on file.  Social History   Socioeconomic History   Marital status: Unknown    Spouse name: Not on file   Number of children: Not on file   Years of education: Not on file   Highest education level: Not on file  Occupational History   Not on file  Tobacco Use   Smoking status: Not on file   Smokeless tobacco: Not on file  Substance and Sexual Activity   Alcohol use: Not on file   Drug use: Not on file   Sexual activity: Not on file  Other Topics Concern   Not on file  Social History Narrative   Not on file   Social Determinants of Health   Financial Resource Strain: Not on file  Food Insecurity: Not on file  Transportation Needs: Not on file  Physical Activity: Not on file  Stress: Not on file  Social Connections: Not on file  Intimate Partner Violence:  Not on file    Current Outpatient Medications on File Prior to Visit  Medication Sig Dispense Refill   acetaminophen (TYLENOL) 325 MG tablet Take 325 mg by mouth every 6 (six) hours as needed for moderate pain or headache.     cetirizine (ZYRTEC) 10 MG tablet Take 10 mg by mouth daily.     clobetasol (TEMOVATE) 0.05 % external solution Apply 1 application topically 2 (two) times daily as needed (irritation).     donepezil (ARICEPT) 5 MG tablet Take 5 mg by mouth at bedtime.     levothyroxine (SYNTHROID) 88 MCG tablet Take 88 mcg by mouth daily before breakfast.     memantine (NAMENDA) 5 MG tablet Take 5 mg by mouth 2 (two) times daily.     MYRBETRIQ 50 MG TB24 tablet Take 50 mg by mouth daily.     rosuvastatin (CRESTOR) 5 MG tablet Take 5 mg by mouth at bedtime.     venlafaxine XR (EFFEXOR-XR) 150 MG 24 hr capsule Take 150 mg by mouth daily with breakfast.     vitamin B-12 (CYANOCOBALAMIN) 1000 MCG tablet Take 1,000 mcg by mouth daily.     No current facility-administered medications on file prior to visit.  Allergies  Allergen Reactions   Codeine Anaphylaxis   Prednisone Anaphylaxis    Family History  Problem Relation Age of Onset   Hypertension Other    Diabetes Neg Hx     BP 114/60 (BP Location: Right Arm, Patient Position: Sitting, Cuff Size: Normal)   Pulse 98   Ht 5\' 3"  (1.6 m)   Wt 133 lb 12.8 oz (60.7 kg)   SpO2 98%   BMI 23.70 kg/m    Review of Systems denies weight loss, sob, n/v, memory loss.      Objective:   Physical Exam Pulses: dorsalis pedis intact bilat.   MSK: no deformity of the feet CV: no leg edema Skin:  no ulcer on the feet.  normal temp on the feet, but both feet are cyanotic.   Neuro: sensation is intact to touch on the feet.    Lab Results  Component Value Date   CREATININE 0.91 01/28/2021   BUN 17 01/28/2021   NA 141 01/28/2021   K 3.6 01/28/2021   CL 105 01/28/2021   CO2 28 01/28/2021    Lab Results  Component Value Date    HGBA1C 9.2 (A) 02/19/2021   I have reviewed outside records, and summarized: Pt was noted to have elevated A1c, and referred here.  She was admitted twice in 2022 for urosepsis.  She did not have DKA.     Assessment & Plan:  Insulin-requiring type 2 DM: uncontrolled.    Patient Instructions  good diet and exercise significantly improve the control of your diabetes.  please let me know if you wish to be referred to a dietician.  high blood sugar is very risky to your health.  you should see an eye doctor and dentist every year.  It is very important to get all recommended vaccinations.  Controlling your blood pressure and cholesterol drastically reduces the damage diabetes does to your body.  Those who smoke should quit.  Please discuss these with your doctor.  check your blood sugar twice a day.  vary the time of day when you check, between before the 3 meals, and at bedtime.  also check if you have symptoms of your blood sugar being too high or too low.  please keep a record of the readings and bring it to your next appointment here (or you can bring the meter itself).  You can write it on any piece of paper.  please call us sooner if your blood sugar goes below 70, or if most of your readings are over 200. We will need to take this complex situation in stages.   For now, please reduce the Tresiba to 18 units per day, and take Novolog 3 times a day (just before each meal), 6-5-5 units. Please come back for a follow-up appointment in 1 month.

## 2021-02-19 NOTE — Patient Instructions (Addendum)
good diet and exercise significantly improve the control of your diabetes.  please let me know if you wish to be referred to a dietician.  high blood sugar is very risky to your health.  you should see an eye doctor and dentist every year.  It is very important to get all recommended vaccinations.  Controlling your blood pressure and cholesterol drastically reduces the damage diabetes does to your body.  Those who smoke should quit.  Please discuss these with your doctor.  check your blood sugar twice a day.  vary the time of day when you check, between before the 3 meals, and at bedtime.  also check if you have symptoms of your blood sugar being too high or too low.  please keep a record of the readings and bring it to your next appointment here (or you can bring the meter itself).  You can write it on any piece of paper.  please call us sooner if your blood sugar goes below 70, or if most of your readings are over 200. We will need to take this complex situation in stages.   For now, please reduce the Tresiba to 18 units per day, and take Novolog 3 times a day (just before each meal), 6-5-5 units. Please come back for a follow-up appointment in 1 month.

## 2021-02-22 DIAGNOSIS — E109 Type 1 diabetes mellitus without complications: Secondary | ICD-10-CM | POA: Diagnosis not present

## 2021-02-22 DIAGNOSIS — Z7982 Long term (current) use of aspirin: Secondary | ICD-10-CM | POA: Diagnosis not present

## 2021-02-22 DIAGNOSIS — I2510 Atherosclerotic heart disease of native coronary artery without angina pectoris: Secondary | ICD-10-CM | POA: Diagnosis not present

## 2021-02-22 DIAGNOSIS — L22 Diaper dermatitis: Secondary | ICD-10-CM | POA: Diagnosis not present

## 2021-02-22 DIAGNOSIS — Z4800 Encounter for change or removal of nonsurgical wound dressing: Secondary | ICD-10-CM | POA: Diagnosis not present

## 2021-02-22 DIAGNOSIS — E039 Hypothyroidism, unspecified: Secondary | ICD-10-CM | POA: Diagnosis not present

## 2021-02-22 DIAGNOSIS — F0280 Dementia in other diseases classified elsewhere without behavioral disturbance: Secondary | ICD-10-CM | POA: Diagnosis not present

## 2021-02-22 DIAGNOSIS — I10 Essential (primary) hypertension: Secondary | ICD-10-CM | POA: Diagnosis not present

## 2021-02-22 DIAGNOSIS — M810 Age-related osteoporosis without current pathological fracture: Secondary | ICD-10-CM | POA: Diagnosis not present

## 2021-02-24 DIAGNOSIS — I2510 Atherosclerotic heart disease of native coronary artery without angina pectoris: Secondary | ICD-10-CM | POA: Diagnosis not present

## 2021-02-24 DIAGNOSIS — I10 Essential (primary) hypertension: Secondary | ICD-10-CM | POA: Diagnosis not present

## 2021-02-24 DIAGNOSIS — F0280 Dementia in other diseases classified elsewhere without behavioral disturbance: Secondary | ICD-10-CM | POA: Diagnosis not present

## 2021-02-24 DIAGNOSIS — E039 Hypothyroidism, unspecified: Secondary | ICD-10-CM | POA: Diagnosis not present

## 2021-02-24 DIAGNOSIS — Z7982 Long term (current) use of aspirin: Secondary | ICD-10-CM | POA: Diagnosis not present

## 2021-02-24 DIAGNOSIS — L22 Diaper dermatitis: Secondary | ICD-10-CM | POA: Diagnosis not present

## 2021-02-24 DIAGNOSIS — M810 Age-related osteoporosis without current pathological fracture: Secondary | ICD-10-CM | POA: Diagnosis not present

## 2021-02-24 DIAGNOSIS — E109 Type 1 diabetes mellitus without complications: Secondary | ICD-10-CM | POA: Diagnosis not present

## 2021-02-24 DIAGNOSIS — Z4800 Encounter for change or removal of nonsurgical wound dressing: Secondary | ICD-10-CM | POA: Diagnosis not present

## 2021-02-25 ENCOUNTER — Telehealth: Payer: Self-pay

## 2021-02-25 DIAGNOSIS — E039 Hypothyroidism, unspecified: Secondary | ICD-10-CM | POA: Diagnosis not present

## 2021-02-25 DIAGNOSIS — E782 Mixed hyperlipidemia: Secondary | ICD-10-CM | POA: Diagnosis not present

## 2021-02-25 DIAGNOSIS — I2510 Atherosclerotic heart disease of native coronary artery without angina pectoris: Secondary | ICD-10-CM | POA: Diagnosis not present

## 2021-02-25 DIAGNOSIS — F0280 Dementia in other diseases classified elsewhere without behavioral disturbance: Secondary | ICD-10-CM | POA: Diagnosis not present

## 2021-02-25 DIAGNOSIS — E109 Type 1 diabetes mellitus without complications: Secondary | ICD-10-CM | POA: Diagnosis not present

## 2021-02-25 DIAGNOSIS — F0390 Unspecified dementia without behavioral disturbance: Secondary | ICD-10-CM | POA: Diagnosis not present

## 2021-02-25 DIAGNOSIS — M810 Age-related osteoporosis without current pathological fracture: Secondary | ICD-10-CM | POA: Diagnosis not present

## 2021-02-25 DIAGNOSIS — Z4800 Encounter for change or removal of nonsurgical wound dressing: Secondary | ICD-10-CM | POA: Diagnosis not present

## 2021-02-25 DIAGNOSIS — L98411 Non-pressure chronic ulcer of buttock limited to breakdown of skin: Secondary | ICD-10-CM | POA: Diagnosis not present

## 2021-02-25 DIAGNOSIS — F3341 Major depressive disorder, recurrent, in partial remission: Secondary | ICD-10-CM | POA: Diagnosis not present

## 2021-02-25 DIAGNOSIS — L22 Diaper dermatitis: Secondary | ICD-10-CM | POA: Diagnosis not present

## 2021-02-25 DIAGNOSIS — Z7982 Long term (current) use of aspirin: Secondary | ICD-10-CM | POA: Diagnosis not present

## 2021-02-25 DIAGNOSIS — I10 Essential (primary) hypertension: Secondary | ICD-10-CM | POA: Diagnosis not present

## 2021-02-25 NOTE — Telephone Encounter (Signed)
Spoke with patient and scheduled an in-person Palliative Consult for 03/24/21 @ 12:30PM.  COVID screening was negative. No pets in home. Patient lives alone.  Consent obtained; updated Outlook/Netsmart/Team List and Epic.   Patient is aware she may be receiving a call from provider the day before or day of to confirm appointment.

## 2021-03-01 DIAGNOSIS — E039 Hypothyroidism, unspecified: Secondary | ICD-10-CM | POA: Diagnosis not present

## 2021-03-01 DIAGNOSIS — F0280 Dementia in other diseases classified elsewhere without behavioral disturbance: Secondary | ICD-10-CM | POA: Diagnosis not present

## 2021-03-01 DIAGNOSIS — E109 Type 1 diabetes mellitus without complications: Secondary | ICD-10-CM | POA: Diagnosis not present

## 2021-03-01 DIAGNOSIS — Z20822 Contact with and (suspected) exposure to covid-19: Secondary | ICD-10-CM | POA: Diagnosis not present

## 2021-03-01 DIAGNOSIS — Z7982 Long term (current) use of aspirin: Secondary | ICD-10-CM | POA: Diagnosis not present

## 2021-03-01 DIAGNOSIS — Z4800 Encounter for change or removal of nonsurgical wound dressing: Secondary | ICD-10-CM | POA: Diagnosis not present

## 2021-03-01 DIAGNOSIS — I2510 Atherosclerotic heart disease of native coronary artery without angina pectoris: Secondary | ICD-10-CM | POA: Diagnosis not present

## 2021-03-01 DIAGNOSIS — I10 Essential (primary) hypertension: Secondary | ICD-10-CM | POA: Diagnosis not present

## 2021-03-01 DIAGNOSIS — M810 Age-related osteoporosis without current pathological fracture: Secondary | ICD-10-CM | POA: Diagnosis not present

## 2021-03-01 DIAGNOSIS — L22 Diaper dermatitis: Secondary | ICD-10-CM | POA: Diagnosis not present

## 2021-03-02 DIAGNOSIS — L22 Diaper dermatitis: Secondary | ICD-10-CM | POA: Diagnosis not present

## 2021-03-02 DIAGNOSIS — I10 Essential (primary) hypertension: Secondary | ICD-10-CM | POA: Diagnosis not present

## 2021-03-02 DIAGNOSIS — I2510 Atherosclerotic heart disease of native coronary artery without angina pectoris: Secondary | ICD-10-CM | POA: Diagnosis not present

## 2021-03-02 DIAGNOSIS — Z7982 Long term (current) use of aspirin: Secondary | ICD-10-CM | POA: Diagnosis not present

## 2021-03-02 DIAGNOSIS — M810 Age-related osteoporosis without current pathological fracture: Secondary | ICD-10-CM | POA: Diagnosis not present

## 2021-03-02 DIAGNOSIS — Z4800 Encounter for change or removal of nonsurgical wound dressing: Secondary | ICD-10-CM | POA: Diagnosis not present

## 2021-03-02 DIAGNOSIS — E109 Type 1 diabetes mellitus without complications: Secondary | ICD-10-CM | POA: Diagnosis not present

## 2021-03-02 DIAGNOSIS — F0280 Dementia in other diseases classified elsewhere without behavioral disturbance: Secondary | ICD-10-CM | POA: Diagnosis not present

## 2021-03-02 DIAGNOSIS — E039 Hypothyroidism, unspecified: Secondary | ICD-10-CM | POA: Diagnosis not present

## 2021-03-03 DIAGNOSIS — E039 Hypothyroidism, unspecified: Secondary | ICD-10-CM | POA: Diagnosis not present

## 2021-03-03 DIAGNOSIS — L22 Diaper dermatitis: Secondary | ICD-10-CM | POA: Diagnosis not present

## 2021-03-03 DIAGNOSIS — F0280 Dementia in other diseases classified elsewhere without behavioral disturbance: Secondary | ICD-10-CM | POA: Diagnosis not present

## 2021-03-03 DIAGNOSIS — M810 Age-related osteoporosis without current pathological fracture: Secondary | ICD-10-CM | POA: Diagnosis not present

## 2021-03-03 DIAGNOSIS — E109 Type 1 diabetes mellitus without complications: Secondary | ICD-10-CM | POA: Diagnosis not present

## 2021-03-03 DIAGNOSIS — Z7982 Long term (current) use of aspirin: Secondary | ICD-10-CM | POA: Diagnosis not present

## 2021-03-03 DIAGNOSIS — I10 Essential (primary) hypertension: Secondary | ICD-10-CM | POA: Diagnosis not present

## 2021-03-03 DIAGNOSIS — I2510 Atherosclerotic heart disease of native coronary artery without angina pectoris: Secondary | ICD-10-CM | POA: Diagnosis not present

## 2021-03-03 DIAGNOSIS — Z4800 Encounter for change or removal of nonsurgical wound dressing: Secondary | ICD-10-CM | POA: Diagnosis not present

## 2021-03-04 ENCOUNTER — Encounter (HOSPITAL_COMMUNITY): Payer: Self-pay

## 2021-03-04 ENCOUNTER — Emergency Department (HOSPITAL_COMMUNITY)
Admission: EM | Admit: 2021-03-04 | Discharge: 2021-03-04 | Disposition: A | Discharge status: Home / Self Care | Payer: Medicare HMO | Source: Home / Self Care | Attending: Emergency Medicine | Admitting: Emergency Medicine

## 2021-03-04 ENCOUNTER — Emergency Department (HOSPITAL_COMMUNITY): Payer: Medicare HMO

## 2021-03-04 ENCOUNTER — Other Ambulatory Visit: Payer: Self-pay

## 2021-03-04 DIAGNOSIS — E162 Hypoglycemia, unspecified: Secondary | ICD-10-CM

## 2021-03-04 DIAGNOSIS — E109 Type 1 diabetes mellitus without complications: Secondary | ICD-10-CM | POA: Insufficient documentation

## 2021-03-04 DIAGNOSIS — Z79899 Other long term (current) drug therapy: Secondary | ICD-10-CM | POA: Diagnosis not present

## 2021-03-04 DIAGNOSIS — E785 Hyperlipidemia, unspecified: Secondary | ICD-10-CM | POA: Diagnosis not present

## 2021-03-04 DIAGNOSIS — N3000 Acute cystitis without hematuria: Secondary | ICD-10-CM | POA: Diagnosis not present

## 2021-03-04 DIAGNOSIS — E860 Dehydration: Secondary | ICD-10-CM | POA: Diagnosis present

## 2021-03-04 DIAGNOSIS — B964 Proteus (mirabilis) (morganii) as the cause of diseases classified elsewhere: Secondary | ICD-10-CM | POA: Diagnosis not present

## 2021-03-04 DIAGNOSIS — R0689 Other abnormalities of breathing: Secondary | ICD-10-CM | POA: Diagnosis not present

## 2021-03-04 DIAGNOSIS — E1165 Type 2 diabetes mellitus with hyperglycemia: Secondary | ICD-10-CM | POA: Diagnosis not present

## 2021-03-04 DIAGNOSIS — Z20822 Contact with and (suspected) exposure to covid-19: Secondary | ICD-10-CM | POA: Insufficient documentation

## 2021-03-04 DIAGNOSIS — E039 Hypothyroidism, unspecified: Secondary | ICD-10-CM | POA: Diagnosis not present

## 2021-03-04 DIAGNOSIS — R4182 Altered mental status, unspecified: Secondary | ICD-10-CM | POA: Diagnosis not present

## 2021-03-04 DIAGNOSIS — R739 Hyperglycemia, unspecified: Secondary | ICD-10-CM | POA: Diagnosis not present

## 2021-03-04 DIAGNOSIS — F0390 Unspecified dementia without behavioral disturbance: Secondary | ICD-10-CM | POA: Insufficient documentation

## 2021-03-04 DIAGNOSIS — Z888 Allergy status to other drugs, medicaments and biological substances status: Secondary | ICD-10-CM | POA: Diagnosis not present

## 2021-03-04 DIAGNOSIS — R5383 Other fatigue: Secondary | ICD-10-CM | POA: Diagnosis not present

## 2021-03-04 DIAGNOSIS — Z87440 Personal history of urinary (tract) infections: Secondary | ICD-10-CM | POA: Diagnosis not present

## 2021-03-04 DIAGNOSIS — N390 Urinary tract infection, site not specified: Secondary | ICD-10-CM | POA: Diagnosis not present

## 2021-03-04 DIAGNOSIS — R7881 Bacteremia: Secondary | ICD-10-CM | POA: Diagnosis not present

## 2021-03-04 DIAGNOSIS — R531 Weakness: Secondary | ICD-10-CM | POA: Insufficient documentation

## 2021-03-04 DIAGNOSIS — R404 Transient alteration of awareness: Secondary | ICD-10-CM | POA: Diagnosis not present

## 2021-03-04 DIAGNOSIS — Z794 Long term (current) use of insulin: Secondary | ICD-10-CM | POA: Diagnosis not present

## 2021-03-04 DIAGNOSIS — Z885 Allergy status to narcotic agent status: Secondary | ICD-10-CM | POA: Diagnosis not present

## 2021-03-04 DIAGNOSIS — R54 Age-related physical debility: Secondary | ICD-10-CM | POA: Diagnosis not present

## 2021-03-04 DIAGNOSIS — Z79890 Hormone replacement therapy (postmenopausal): Secondary | ICD-10-CM | POA: Diagnosis not present

## 2021-03-04 DIAGNOSIS — E11649 Type 2 diabetes mellitus with hypoglycemia without coma: Secondary | ICD-10-CM | POA: Diagnosis not present

## 2021-03-04 DIAGNOSIS — Z8249 Family history of ischemic heart disease and other diseases of the circulatory system: Secondary | ICD-10-CM | POA: Diagnosis not present

## 2021-03-04 LAB — COMPREHENSIVE METABOLIC PANEL
ALT: 17 U/L (ref 0–44)
AST: 17 U/L (ref 15–41)
Albumin: 3.3 g/dL — ABNORMAL LOW (ref 3.5–5.0)
Alkaline Phosphatase: 95 U/L (ref 38–126)
Anion gap: 11 (ref 5–15)
BUN: 26 mg/dL — ABNORMAL HIGH (ref 8–23)
CO2: 30 mmol/L (ref 22–32)
Calcium: 9.6 mg/dL (ref 8.9–10.3)
Chloride: 101 mmol/L (ref 98–111)
Creatinine, Ser: 1.11 mg/dL — ABNORMAL HIGH (ref 0.44–1.00)
GFR, Estimated: 52 mL/min — ABNORMAL LOW (ref >60)
Glucose, Bld: 71 mg/dL (ref 70–99)
Potassium: 4.3 mmol/L (ref 3.5–5.1)
Sodium: 142 mmol/L (ref 135–145)
Total Bilirubin: 0.4 mg/dL (ref 0.3–1.2)
Total Protein: 8 g/dL (ref 6.5–8.1)

## 2021-03-04 LAB — CBC WITH DIFFERENTIAL/PLATELET
Abs Immature Granulocytes: 0.04 10*3/uL (ref 0.00–0.07)
Basophils Absolute: 0.1 10*3/uL (ref 0.0–0.1)
Basophils Relative: 1 %
Eosinophils Absolute: 0.3 10*3/uL (ref 0.0–0.5)
Eosinophils Relative: 2 %
HCT: 38 % (ref 36.0–46.0)
Hemoglobin: 12.2 g/dL (ref 12.0–15.0)
Immature Granulocytes: 0 %
Lymphocytes Relative: 18 %
Lymphs Abs: 1.8 10*3/uL (ref 0.7–4.0)
MCH: 28.5 pg (ref 26.0–34.0)
MCHC: 32.1 g/dL (ref 30.0–36.0)
MCV: 88.8 fL (ref 80.0–100.0)
Monocytes Absolute: 0.8 10*3/uL (ref 0.1–1.0)
Monocytes Relative: 8 %
Neutro Abs: 7.3 10*3/uL (ref 1.7–7.7)
Neutrophils Relative %: 71 %
Platelets: 465 10*3/uL — ABNORMAL HIGH (ref 150–400)
RBC: 4.28 MIL/uL (ref 3.87–5.11)
RDW: 12.8 % (ref 11.5–15.5)
WBC: 10.3 10*3/uL (ref 4.0–10.5)
nRBC: 0 % (ref 0.0–0.2)

## 2021-03-04 LAB — URINALYSIS, ROUTINE W REFLEX MICROSCOPIC
Bilirubin Urine: NEGATIVE
Glucose, UA: NEGATIVE mg/dL
Ketones, ur: NEGATIVE mg/dL
Nitrite: NEGATIVE
Protein, ur: 30 mg/dL — AB
Specific Gravity, Urine: 1.011 (ref 1.005–1.030)
WBC, UA: >50 WBC/hpf — ABNORMAL HIGH (ref 0–5)
pH: 7 (ref 5.0–8.0)

## 2021-03-04 LAB — CBG MONITORING, ED
Glucose-Capillary: 43 mg/dL — CL (ref 70–99)
Glucose-Capillary: 129 mg/dL — ABNORMAL HIGH (ref 70–99)

## 2021-03-04 LAB — RESP PANEL BY RT-PCR (FLU A&B, COVID) ARPGX2
Influenza A by PCR: NEGATIVE
Influenza B by PCR: NEGATIVE
SARS Coronavirus 2 by RT PCR: NEGATIVE

## 2021-03-04 LAB — LACTIC ACID, PLASMA: Lactic Acid, Venous: 1 mmol/L (ref 0.5–1.9)

## 2021-03-04 MED ORDER — FOSFOMYCIN TROMETHAMINE 3 G PO PACK
3.0000 g | PACK | Freq: Once | ORAL | Status: AC
  Administered 2021-03-04: 19:00:00 3 g via ORAL
  Filled 2021-03-04: qty 3

## 2021-03-04 MED ORDER — DEXTROSE 50 % IV SOLN: 1.0000 | Freq: Once | INTRAVENOUS | Status: DC

## 2021-03-04 MED ORDER — SODIUM CHLORIDE 0.9 % IV BOLUS
1000.0000 mL | Freq: Once | INTRAVENOUS | Status: AC
  Administered 2021-03-04: 14:00:00 1000 mL via INTRAVENOUS

## 2021-03-04 NOTE — Discharge Instructions (Signed)
The antibiotic you are given here should last for few days.  You will be notified if you need more antibiotics.  Try and keep her self hydrated.  Eat and drink more than you have been

## 2021-03-04 NOTE — ED Provider Notes (Signed)
[facility] DEPT Provider Note   CSN: [id number] Arrival date & time: 03/04/21  1307     History Chief Complaint  Patient presents with   Fatigue    Wanda Jones is a 75 y.o. female.  Pt presents to the ED today with fatigue.  Pt said she feels very weak.  Family is concerned for a UTI as this has caused her sx in the past.  Pt denies dysuria.  No fevers.  No pain.      Past Medical History:  Diagnosis Date   Dementia ([place]) 01/24/2021   Diabetes mellitus type 1 ([place])    History of recurrent UTIs    Hyperlipidemia 01/24/2021    Patient Active Problem List   Diagnosis Date Noted   Sepsis secondary to UTI ([place]) 01/24/2021   Volume depletion 01/24/2021   Hyperlipidemia 01/24/2021   Dementia ([place]) 01/24/2021   Acute encephalopathy 01/24/2021   Mild protein malnutrition ([place]) 01/24/2021   Sepsis ([place]) 01/02/2021   UTI (urinary tract infection) 01/02/2021   Cellulitis 01/02/2021   Hyperglycemia 01/02/2021   AKI (acute kidney injury) ([place]) 01/02/2021   Type 1 diabetes ([place]) 01/02/2021    History reviewed. No pertinent surgical history.   OB History   No obstetric history on file.     Family History  Problem Relation Age of Onset   Hypertension Other    Diabetes Neg Hx        Home Medications Prior to Admission medications   Medication Sig Start Date End Date Taking? Authorizing Provider  acetaminophen (TYLENOL) 325 MG tablet Take 325 mg by mouth every 6 (six) hours as needed for moderate pain or headache.    [provider]  cetirizine (ZYRTEC) 10 MG tablet Take 10 mg by mouth daily.    [provider]  clobetasol (TEMOVATE) 0.05 % external solution Apply 1 application topically 2 (two) times daily as needed (irritation).    [provider]  donepezil (ARICEPT) 5 MG tablet Take 5 mg by mouth at bedtime.    [provider]  insulin aspart (NOVOLOG FLEXPEN) 100 UNIT/ML FlexPen 3 times a day (just  before each meal), 6-5-5 units. 02/19/21   Renato Shin, MD  insulin degludec (TRESIBA FLEXTOUCH) 100 UNIT/ML FlexTouch Pen Inject 18 Units into the skin daily. 02/19/21   Renato Shin, MD  levothyroxine (SYNTHROID) 88 MCG tablet Take 88 mcg by mouth daily before breakfast.    [provider]  memantine (NAMENDA) 5 MG tablet Take 5 mg by mouth 2 (two) times daily.    [provider]  MYRBETRIQ 50 MG TB24 tablet Take 50 mg by mouth daily. 11/27/20   [provider]  rosuvastatin (CRESTOR) 5 MG tablet Take 5 mg by mouth at bedtime.    [provider]  venlafaxine XR (EFFEXOR-XR) 150 MG 24 hr capsule Take 150 mg by mouth daily with breakfast.    [provider]  vitamin B-12 (CYANOCOBALAMIN) 1000 MCG tablet Take 1,000 mcg by mouth daily.    [provider]    Allergies    Codeine and Prednisone  Review of Systems   Review of Systems  Neurological:  Positive for weakness.  All other systems reviewed and are negative.  Physical Exam Updated Vital Signs BP 114/61   Pulse 81   Temp 97.9 F (36.6 C) (Oral)   Resp 18   SpO2 99%   Physical Exam Vitals and nursing note reviewed.  Constitutional:      Appearance:  Normal appearance.  HENT:     Head: Normocephalic and atraumatic.     Right Ear: External ear normal.     Left Ear: External ear normal.     Nose: Nose normal.     Mouth/Throat:     Mouth: Mucous membranes are dry.  Eyes:     Extraocular Movements: Extraocular movements intact.     Conjunctiva/sclera: Conjunctivae normal.     Pupils: Pupils are equal, round, and reactive to light.  Cardiovascular:     Rate and Rhythm: Normal rate and regular rhythm.     Pulses: Normal pulses.     Heart sounds: Normal heart sounds.  Pulmonary:     Effort: Pulmonary effort is normal.     Breath sounds: Normal breath sounds.  Abdominal:     General: Abdomen is flat. Bowel sounds are normal.     Palpations: Abdomen is soft.   Musculoskeletal:        General: Normal range of motion.     Cervical back: Normal range of motion and neck supple.  Skin:    General: Skin is warm.     Capillary Refill: Capillary refill takes less than 2 seconds.  Neurological:     General: No focal deficit present.     Mental Status: She is alert.  Psychiatric:        Mood and Affect: Mood normal.        Behavior: Behavior normal.    ED Results / Procedures / Treatments   Labs (all labs ordered are listed, but only abnormal results are displayed) Labs Reviewed  CBC WITH DIFFERENTIAL/PLATELET - Abnormal; Notable for the following components:      Result Value   Platelets 465 (*)    All other components within normal limits  COMPREHENSIVE METABOLIC PANEL - Abnormal; Notable for the following components:   BUN 26 (*)    Creatinine, Ser 1.11 (*)    Albumin 3.3 (*)    GFR, Estimated 52 (*)    All other components within normal limits  RESP PANEL BY RT-PCR (FLU A&B, COVID) ARPGX2  LACTIC ACID, PLASMA  URINALYSIS, ROUTINE W REFLEX MICROSCOPIC  CBG MONITORING, ED    EKG None  Radiology DG Chest Portable 1 View  Result Date: 03/04/2021 CLINICAL DATA:  Altered mental status, weakness EXAM: PORTABLE CHEST 1 VIEW COMPARISON:  01/24/2021 FINDINGS: The heart size and mediastinal contours are within normal limits. Both lungs are clear. The visualized skeletal structures are unremarkable. Nonacute fracture deformity of the proximal right humerus. IMPRESSION: No acute abnormality of the lungs in AP portable projection. Electronically Signed   By: Delanna Ahmadi M.D.   On: 03/04/2021 14:11    Procedures Procedures   Medications Ordered in ED Medications  sodium chloride 0.9 % bolus 1,000 mL (1,000 mLs Intravenous New Bag/Given 03/04/21 1426)    ED Course  I have reviewed the triage vital signs and the nursing notes.  Pertinent labs & imaging results that were available during my care of the patient were reviewed by me and  considered in my medical decision making (see chart for details).    MDM Rules/Calculators/A&P                           Pt's labs are normal.  Urine is pending.  Pt signed out to Dr. Alvino Chapel at shift change.  Final Clinical Impression(s) / ED Diagnoses Final diagnoses:  Weakness    Rx / DC Orders  ED Discharge Orders     None        Isla Pence, MD 03/04/21 1529

## 2021-03-04 NOTE — ED Triage Notes (Signed)
EMS reports from [facility], son called out for possible UTI, states CBG varies wildly when this happened before and has last for last week. Pt denies urinary symptoms only c/o fatigue.  BP 108/68 HR 84 RR 16 Sp02 98 RA CBG 123 Temp 97.7

## 2021-03-05 ENCOUNTER — Encounter (HOSPITAL_COMMUNITY): Payer: Self-pay | Admitting: Emergency Medicine

## 2021-03-05 ENCOUNTER — Inpatient Hospital Stay (HOSPITAL_COMMUNITY)
Admission: EM | Admit: 2021-03-05 | Discharge: 2021-03-07 | DRG: 690 | Disposition: A | Discharge status: Home Health | Payer: Medicare HMO | Attending: Internal Medicine | Admitting: Internal Medicine

## 2021-03-05 ENCOUNTER — Other Ambulatory Visit: Payer: Self-pay

## 2021-03-05 ENCOUNTER — Emergency Department (HOSPITAL_COMMUNITY): Payer: Medicare HMO

## 2021-03-05 DIAGNOSIS — F0390 Unspecified dementia without behavioral disturbance: Secondary | ICD-10-CM | POA: Diagnosis present

## 2021-03-05 DIAGNOSIS — B964 Proteus (mirabilis) (morganii) as the cause of diseases classified elsewhere: Secondary | ICD-10-CM | POA: Diagnosis present

## 2021-03-05 DIAGNOSIS — Z20822 Contact with and (suspected) exposure to covid-19: Secondary | ICD-10-CM | POA: Diagnosis present

## 2021-03-05 DIAGNOSIS — R531 Weakness: Secondary | ICD-10-CM

## 2021-03-05 DIAGNOSIS — Z79899 Other long term (current) drug therapy: Secondary | ICD-10-CM

## 2021-03-05 DIAGNOSIS — Z79890 Hormone replacement therapy (postmenopausal): Secondary | ICD-10-CM

## 2021-03-05 DIAGNOSIS — L8990 Pressure ulcer of unspecified site, unspecified stage: Secondary | ICD-10-CM | POA: Insufficient documentation

## 2021-03-05 DIAGNOSIS — R739 Hyperglycemia, unspecified: Secondary | ICD-10-CM | POA: Diagnosis present

## 2021-03-05 DIAGNOSIS — E785 Hyperlipidemia, unspecified: Secondary | ICD-10-CM | POA: Diagnosis present

## 2021-03-05 DIAGNOSIS — E039 Hypothyroidism, unspecified: Secondary | ICD-10-CM | POA: Diagnosis present

## 2021-03-05 DIAGNOSIS — R7881 Bacteremia: Secondary | ICD-10-CM | POA: Diagnosis present

## 2021-03-05 DIAGNOSIS — Z8249 Family history of ischemic heart disease and other diseases of the circulatory system: Secondary | ICD-10-CM

## 2021-03-05 DIAGNOSIS — Z888 Allergy status to other drugs, medicaments and biological substances status: Secondary | ICD-10-CM

## 2021-03-05 DIAGNOSIS — R54 Age-related physical debility: Secondary | ICD-10-CM | POA: Diagnosis present

## 2021-03-05 DIAGNOSIS — Z885 Allergy status to narcotic agent status: Secondary | ICD-10-CM

## 2021-03-05 DIAGNOSIS — E860 Dehydration: Secondary | ICD-10-CM | POA: Diagnosis present

## 2021-03-05 DIAGNOSIS — N390 Urinary tract infection, site not specified: Secondary | ICD-10-CM | POA: Diagnosis not present

## 2021-03-05 DIAGNOSIS — Z794 Long term (current) use of insulin: Secondary | ICD-10-CM

## 2021-03-05 DIAGNOSIS — Z87440 Personal history of urinary (tract) infections: Secondary | ICD-10-CM

## 2021-03-05 DIAGNOSIS — N3000 Acute cystitis without hematuria: Secondary | ICD-10-CM

## 2021-03-05 DIAGNOSIS — E1165 Type 2 diabetes mellitus with hyperglycemia: Secondary | ICD-10-CM | POA: Diagnosis present

## 2021-03-05 DIAGNOSIS — E119 Type 2 diabetes mellitus without complications: Secondary | ICD-10-CM

## 2021-03-05 LAB — CBC WITH DIFFERENTIAL/PLATELET
Abs Immature Granulocytes: 0.03 10*3/uL (ref 0.00–0.07)
Basophils Absolute: 0 10*3/uL (ref 0.0–0.1)
Basophils Relative: 0 %
Eosinophils Absolute: 0.2 10*3/uL (ref 0.0–0.5)
Eosinophils Relative: 2 %
HCT: 36.4 % (ref 36.0–46.0)
Hemoglobin: 11.4 g/dL — ABNORMAL LOW (ref 12.0–15.0)
Immature Granulocytes: 0 %
Lymphocytes Relative: 18 %
Lymphs Abs: 1.6 10*3/uL (ref 0.7–4.0)
MCH: 28.1 pg (ref 26.0–34.0)
MCHC: 31.3 g/dL (ref 30.0–36.0)
MCV: 89.9 fL (ref 80.0–100.0)
Monocytes Absolute: 0.6 10*3/uL (ref 0.1–1.0)
Monocytes Relative: 7 %
Neutro Abs: 6.8 10*3/uL (ref 1.7–7.7)
Neutrophils Relative %: 73 %
Platelets: 409 10*3/uL — ABNORMAL HIGH (ref 150–400)
RBC: 4.05 MIL/uL (ref 3.87–5.11)
RDW: 12.7 % (ref 11.5–15.5)
WBC: 9.2 10*3/uL (ref 4.0–10.5)
nRBC: 0 % (ref 0.0–0.2)

## 2021-03-05 LAB — COMPREHENSIVE METABOLIC PANEL
ALT: 17 U/L (ref 0–44)
AST: 17 U/L (ref 15–41)
Albumin: 2.9 g/dL — ABNORMAL LOW (ref 3.5–5.0)
Alkaline Phosphatase: 86 U/L (ref 38–126)
Anion gap: 7 (ref 5–15)
BUN: 22 mg/dL (ref 8–23)
CO2: 28 mmol/L (ref 22–32)
Calcium: 8.8 mg/dL — ABNORMAL LOW (ref 8.9–10.3)
Chloride: 103 mmol/L (ref 98–111)
Creatinine, Ser: 1.02 mg/dL — ABNORMAL HIGH (ref 0.44–1.00)
GFR, Estimated: 57 mL/min — ABNORMAL LOW (ref >60)
Glucose, Bld: 303 mg/dL — ABNORMAL HIGH (ref 70–99)
Potassium: 4.4 mmol/L (ref 3.5–5.1)
Sodium: 138 mmol/L (ref 135–145)
Total Bilirubin: 0.3 mg/dL (ref 0.3–1.2)
Total Protein: 7.1 g/dL (ref 6.5–8.1)

## 2021-03-05 LAB — GLUCOSE, CAPILLARY: Glucose-Capillary: 194 mg/dL — ABNORMAL HIGH (ref 70–99)

## 2021-03-05 LAB — RESP PANEL BY RT-PCR (FLU A&B, COVID) ARPGX2
Influenza A by PCR: NEGATIVE
Influenza B by PCR: NEGATIVE
SARS Coronavirus 2 by RT PCR: NEGATIVE

## 2021-03-05 LAB — CBG MONITORING, ED: Glucose-Capillary: 270 mg/dL — ABNORMAL HIGH (ref 70–99)

## 2021-03-05 MED ORDER — MIRABEGRON ER 25 MG PO TB24
50.0000 mg | ORAL_TABLET | Freq: Every day | ORAL | Status: DC
  Filled 2021-03-05 (×3): qty 2

## 2021-03-05 MED ORDER — INSULIN GLARGINE-YFGN 100 UNIT/ML ~~LOC~~ SOLN
15.0000 [IU] | Freq: Every day | SUBCUTANEOUS | Status: DC
  Administered 2021-03-05 – 2021-03-06 (×2): 15 [IU] via SUBCUTANEOUS
  Filled 2021-03-05 (×3): qty 0.15

## 2021-03-05 MED ORDER — ADULT MULTIVITAMIN W/MINERALS CH
1.0000 | ORAL_TABLET | Freq: Every day | ORAL | Status: DC
Start: 2021-03-05 — End: 2021-03-06

## 2021-03-05 MED ORDER — INSULIN ASPART 100 UNIT/ML IJ SOLN
0.0000 [IU] | Freq: Three times a day (TID) | INTRAMUSCULAR | Status: DC
  Administered 2021-03-06: 1 [IU] via SUBCUTANEOUS
  Administered 2021-03-06: 3 [IU] via SUBCUTANEOUS
  Administered 2021-03-06: 5 [IU] via SUBCUTANEOUS
  Administered 2021-03-07 (×2): 3 [IU] via SUBCUTANEOUS

## 2021-03-05 MED ORDER — ENOXAPARIN SODIUM 40 MG/0.4ML IJ SOSY
40.0000 mg | PREFILLED_SYRINGE | INTRAMUSCULAR | Status: DC
  Administered 2021-03-05 – 2021-03-06 (×2): 40 mg via SUBCUTANEOUS
  Filled 2021-03-05 (×2): qty 0.4

## 2021-03-05 MED ORDER — SODIUM CHLORIDE 0.9 % IV SOLN: INTRAVENOUS | Status: DC

## 2021-03-05 MED ORDER — ROSUVASTATIN CALCIUM 5 MG PO TABS: 5.0000 mg | ORAL_TABLET | Freq: Every day | ORAL | Status: DC

## 2021-03-05 MED ORDER — VENLAFAXINE HCL ER 75 MG PO CP24: 150.0000 mg | ORAL_CAPSULE | Freq: Every day | ORAL | Status: DC

## 2021-03-05 MED ORDER — INSULIN ASPART 100 UNIT/ML IJ SOLN: 0.0000 [IU] | Freq: Every day | INTRAMUSCULAR | Status: DC

## 2021-03-05 MED ORDER — SODIUM CHLORIDE 0.9 % IV BOLUS
1000.0000 mL | Freq: Once | INTRAVENOUS | Status: AC
  Administered 2021-03-05: 1000 mL via INTRAVENOUS

## 2021-03-05 MED ORDER — SODIUM CHLORIDE 0.9 % IV SOLN
2.0000 g | INTRAVENOUS | Status: DC
  Administered 2021-03-05 – 2021-03-06 (×2): 2 g via INTRAVENOUS
  Filled 2021-03-05 (×3): qty 20

## 2021-03-05 MED ORDER — DONEPEZIL HCL 5 MG PO TABS
5.0000 mg | ORAL_TABLET | Freq: Every day | ORAL | Status: DC
  Filled 2021-03-05 (×2): qty 1

## 2021-03-05 MED ORDER — VITAMIN B-12 1000 MCG PO TABS: 1000.0000 ug | ORAL_TABLET | Freq: Every day | ORAL | Status: DC

## 2021-03-05 MED ORDER — LEVOTHYROXINE SODIUM 88 MCG PO TABS
88.0000 ug | ORAL_TABLET | Freq: Every day | ORAL | Status: DC
  Administered 2021-03-07: 88 ug via ORAL
  Filled 2021-03-05: qty 1

## 2021-03-05 MED ORDER — MEMANTINE HCL 5 MG PO TABS
5.0000 mg | ORAL_TABLET | Freq: Two times a day (BID) | ORAL | Status: DC
  Filled 2021-03-05 (×3): qty 1

## 2021-03-05 NOTE — ED Triage Notes (Signed)
Pt from [facility] via EMS. Pt was assessed here yesterday for weakness and possible UTI. Per facility, pt has "deteriorated" since yesterday. Reports decreased oral intake and ams.

## 2021-03-05 NOTE — H&P (Signed)
History and Physical    Wanda Jones TSV:[id number] DOB: Dec 31, 1945 DOA: 03/05/2021  PCP: Pcp, No   Patient coming from: Independent living facility  Chief Complaint: Confusion, weakness  HPI: Wanda Jones is a 75 y.o. female with medical history significant of diabetes type 2, dementia, recurrent UTI.  Hyperlipidemia, hypothyroidism who presented from independent living facility after she became progressively weak.  She was also having poor oral intake, fatigue and also became more confused than at baseline.  This has been going on for last 2 weeks.  She was brought to the emergency department here on 03/04/2021.  UTI was suspected after urinalysis was positive and she was given a dose of fosfomycin.  Patient became too weak to get up and was unable to ambulate with walker.  She consistently had poor oral intake and looked dehydrated on examination.  Appeared frail.  Her vitals remained stable during the stay at emergency department.  She was afebrile. Patient was requested to be admitted for consistent poor oral intake, weakness and the need of IV antibiotics, IV fluids. Patient seen and examined at the bedside this evening.  During my evaluation, she was hemodynamically stable.  When asked, she states she feels miserable.  She denied any clear dysuria, lower abdominal pain, increased frequency, fever, chills, shortness of breath, cough, nausea, vomiting or diarrhea but states she feels very weak and feels that she had infection in the urinary tract again.  On examination she looked very dehydrated.  ED Course: Remained hemodynamically stable during emergency department stay.  Her blood sugars were fluctuating from 40-300s.  Diabetic coordinator was consulted.  She had mildly elevated creatinine in the range of 1.1.  She does not have leukocytosis.  She remained afebrile.  Influenza/COVID screen test negative.  Chest x-ray did not show any pneumonia.  Review of Systems: As per HPI otherwise 10  point review of systems negative.    Past Medical History:  Diagnosis Date   Dementia ([place]) 01/24/2021   Diabetes mellitus type 1 ([place])    History of recurrent UTIs    Hyperlipidemia 01/24/2021    History reviewed. No pertinent surgical history.   has no history on file for tobacco use, alcohol use, and drug use.  Allergies  Allergen Reactions   Codeine Anaphylaxis   Prednisone Anaphylaxis    Family History  Problem Relation Age of Onset   Hypertension Other    Diabetes Neg Hx      Prior to Admission medications   Medication Sig Start Date End Date Taking? Authorizing Provider  acetaminophen (TYLENOL) 325 MG tablet Take 325 mg by mouth every 6 (six) hours as needed for moderate pain or headache.    [provider]  cetirizine (ZYRTEC) 10 MG tablet Take 10 mg by mouth daily as needed for allergies.    [provider]  clobetasol (TEMOVATE) 0.05 % external solution Apply 1 application topically 2 (two) times daily as needed (irritation).    [provider]  donepezil (ARICEPT) 5 MG tablet Take 5 mg by mouth at bedtime.    [provider]  insulin aspart (NOVOLOG FLEXPEN) 100 UNIT/ML FlexPen 3 times a day (just before each meal), 6-5-5 units. Patient taking differently: 5-6 Units 3 (three) times daily with meals. Take 6 units in the morning, Take 5 units in the afternoon & Take 5 units in the evening 02/19/21   Renato Shin, MD  insulin degludec (TRESIBA FLEXTOUCH) 100 UNIT/ML FlexTouch Pen Inject 18 Units into the skin daily. 02/19/21  Renato Shin, MD  levothyroxine (SYNTHROID) 88 MCG tablet Take 88 mcg by mouth daily before breakfast.    [provider]  memantine (NAMENDA) 5 MG tablet Take 5 mg by mouth 2 (two) times daily.    [provider]  Multiple Vitamins-Minerals (MULTIVITAMIN ADULTS) TABS Take 1 tablet by mouth daily.    [provider]  MYRBETRIQ 50 MG TB24 tablet Take 50 mg by mouth daily. 11/27/20    [provider]  rosuvastatin (CRESTOR) 5 MG tablet Take 5 mg by mouth at bedtime.    [provider]  venlafaxine XR (EFFEXOR-XR) 150 MG 24 hr capsule Take 150 mg by mouth daily with breakfast.    [provider]  vitamin B-12 (CYANOCOBALAMIN) 1000 MCG tablet Take 1,000 mcg by mouth daily.    [provider]    Physical Exam: Vitals:   03/05/21 1545 03/05/21 1600 03/05/21 1615 03/05/21 1630  BP:  (!) 142/75  (!) 153/79  Pulse: 77 76 79 74  Resp: 17 (!) 8 (!) 25 (!) 24  Temp:      TempSrc:      SpO2: 98% 98% 95% 95%    Constitutional: Not in distress, pleasant elderly female, deconditioned Vitals:   03/05/21 1545 03/05/21 1600 03/05/21 1615 03/05/21 1630  BP:  (!) 142/75  (!) 153/79  Pulse: 77 76 79 74  Resp: 17 (!) 8 (!) 25 (!) 24  Temp:      TempSrc:      SpO2: 98% 98% 95% 95%   Eyes: PERRL, lids and conjunctivae normal ENMT: Mucous membranes are dry Neck: normal, supple, no masses, no thyromegaly Respiratory: clear to auscultation bilaterally, no wheezing, no crackles. Normal respiratory effort. No accessory muscle use.  Cardiovascular: Regular rate and rhythm, no murmurs / rubs / gallops. No extremity edema.  Abdomen: no tenderness, no masses palpated. No hepatosplenomegaly. Bowel sounds positive.  Musculoskeletal: no clubbing / cyanosis. No joint deformity upper and lower extremities.  Skin: no rashes, lesions, ulcers. No induration Neurologic: CN 2-12 grossly intact.  Strength 5/5 in all 4.  Psychiatric: Oriented to time.  Knew the month but could not tell the day.   Foley Catheter:None  Labs on Admission: I have personally reviewed following labs and imaging studies  CBC: Recent Labs  Lab 03/04/21 1327 03/05/21 1526  WBC 10.3 9.2  NEUTROABS 7.3 6.8  HGB 12.2 11.4*  HCT 38.0 36.4  MCV 88.8 89.9  PLT 465* 782*   Basic Metabolic Panel: Recent Labs  Lab 03/04/21 1327 03/05/21 1526  NA 142 138  K 4.3 4.4  CL 101 103   CO2 30 28  GLUCOSE 71 303*  BUN 26* 22  CREATININE 1.11* 1.02*  CALCIUM 9.6 8.8*   GFR: CrCl cannot be calculated (Unknown ideal weight.). Liver Function Tests: Recent Labs  Lab 03/04/21 1327 03/05/21 1526  AST 17 17  ALT 17 17  ALKPHOS 95 86  BILITOT 0.4 0.3  PROT 8.0 7.1  ALBUMIN 3.3* 2.9*   No results for input(s): LIPASE, AMYLASE in the last 168 hours. No results for input(s): AMMONIA in the last 168 hours. Coagulation Profile: No results for input(s): INR, PROTIME in the last 168 hours. Cardiac Enzymes: No results for input(s): CKTOTAL, CKMB, CKMBINDEX, TROPONINI in the last 168 hours. BNP (last 3 results) No results for input(s): PROBNP in the last 8760 hours. HbA1C: No results for input(s): HGBA1C in the last 72 hours. CBG: Recent Labs  Lab 03/04/21 1715 03/04/21 1841 03/05/21  [place]*   Lipid Profile: No results for input(s): CHOL, HDL, LDLCALC, TRIG, CHOLHDL, LDLDIRECT in the last 72 hours. Thyroid Function Tests: No results for input(s): TSH, T4TOTAL, FREET4, T3FREE, THYROIDAB in the last 72 hours. Anemia Panel: No results for input(s): VITAMINB12, FOLATE, FERRITIN, TIBC, IRON, RETICCTPCT in the last 72 hours. Urine analysis:    Component Value Date/Time   COLORURINE YELLOW 03/04/2021 1327   APPEARANCEUR HAZY (A) 03/04/2021 1327   LABSPEC 1.011 03/04/2021 1327   PHURINE 7.0 03/04/2021 1327   GLUCOSEU NEGATIVE 03/04/2021 1327   HGBUR MODERATE (A) 03/04/2021 1327   BILIRUBINUR NEGATIVE 03/04/2021 [employer] 03/04/2021 1327   PROTEINUR 30 (A) 03/04/2021 1327   NITRITE NEGATIVE 03/04/2021 1327   LEUKOCYTESUR LARGE (A) 03/04/2021 1327    Radiological Exams on Admission: DG Chest Portable 1 View  Result Date: 03/05/2021 CLINICAL DATA:  Weakness EXAM: PORTABLE CHEST 1 VIEW COMPARISON:  Chest x-ray 03/04/2021 FINDINGS: The heart and mediastinal contours are unchanged. Aortic calcification. No focal consolidation. No  pulmonary edema. No pleural effusion. No pneumothorax. No acute osseous abnormality. Severe degenerative changes of the right shoulder. IMPRESSION: No active disease. Electronically Signed   By: Iven Finn M.D.   On: 03/05/2021 16:01   DG Chest Portable 1 View  Result Date: 03/04/2021 CLINICAL DATA:  Altered mental status, weakness EXAM: PORTABLE CHEST 1 VIEW COMPARISON:  01/24/2021 FINDINGS: The heart size and mediastinal contours are within normal limits. Both lungs are clear. The visualized skeletal structures are unremarkable. Nonacute fracture deformity of the proximal right humerus. IMPRESSION: No acute abnormality of the lungs in AP portable projection. Electronically Signed   By: Delanna Ahmadi M.D.   On: 03/04/2021 14:11     Assessment/Plan Principal Problem:   UTI (urinary tract infection) Active Problems:   Hyperglycemia   Diabetes mellitus ([place])   Hyperlipidemia   Dementia ([place])  Weakness/fatigue/poor oral intake/dehydration: Could be from urinary tract infection.  She has history of recurrent UTI.  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  We will follow-up urine culture and blood culture.  Currently she is hemodynamically stable.  She was admitted in October this year for the same when she was diagnosed with bacteremia/urinary tract infection with Proteus.  Continue ceftriaxone 2gm daily for now.  Type 2 diabetes: Monitor blood sugars.  Continue sliding-scale insulin  and lantus for now.  Recent hemoglobin A1c of 9.6 as per 9/22.  Diabetic coordinator following.  She follows with endocrinologist as an outpatient.  Her diabetes is uncontrolled.  History of dementia: More confused than this baseline.  Could be from UTI.  Continue delirium precautions.  Takes memantine, donepezil which we will continue.   Continue supportive care  History of hyperlipidemia: takes Crestor.  We will continue  Deconditioning/debility: Ambulates with the help of walker.  Will consult PT/OT when appropriate.  Severity of Illness: The appropriate patient status for this patient is OBSERVATION.   DVT prophylaxis: Lovenox Code Status: Full Family Communication: Called son on the phone for update, call not received Consults called: None     Shelly Coss MD [facility]  03/05/2021, 5:54 PM

## 2021-03-05 NOTE — ED Provider Notes (Signed)
[facility] DEPT Provider Note   CSN: [id number] Arrival date & time: 03/05/21  1500     History Chief Complaint  Patient presents with   Weakness    Wanda Jones is a 75 y.o. female.  The history is provided by the patient.  Weakness Severity:  Moderate Onset quality:  Gradual Timing:  Constant Progression:  Unchanged Chronicity:  New Context: dehydration and recent infection   Relieved by:  Nothing Worsened by:  Nothing Associated symptoms: no abdominal pain, no arthralgias, no chest pain, no cough, no dysuria, no fever, no seizures, no shortness of breath and no vomiting       Past Medical History:  Diagnosis Date   Dementia ([place]) 01/24/2021   Diabetes mellitus type 1 ([place])    History of recurrent UTIs    Hyperlipidemia 01/24/2021    Patient Active Problem List   Diagnosis Date Noted   Sepsis secondary to UTI ([place]) 01/24/2021   Volume depletion 01/24/2021   Hyperlipidemia 01/24/2021   Dementia ([place]) 01/24/2021   Acute encephalopathy 01/24/2021   Mild protein malnutrition ([place]) 01/24/2021   Sepsis ([place]) 01/02/2021   UTI (urinary tract infection) 01/02/2021   Cellulitis 01/02/2021   Hyperglycemia 01/02/2021   AKI (acute kidney injury) ([place]) 01/02/2021   Type 1 diabetes ([place]) 01/02/2021    History reviewed. No pertinent surgical history.   OB History   No obstetric history on file.     Family History  Problem Relation Age of Onset   Hypertension Other    Diabetes Neg Hx        Home Medications Prior to Admission medications   Medication Sig Start Date End Date Taking? Authorizing Provider  acetaminophen (TYLENOL) 325 MG tablet Take 325 mg by mouth every 6 (six) hours as needed for moderate pain or headache.    [provider]  cetirizine (ZYRTEC) 10 MG tablet Take 10 mg by mouth daily as needed for allergies.    [provider]  clobetasol (TEMOVATE) 0.05 % external solution Apply 1 application  topically 2 (two) times daily as needed (irritation).    [provider]  donepezil (ARICEPT) 5 MG tablet Take 5 mg by mouth at bedtime.    [provider]  insulin aspart (NOVOLOG FLEXPEN) 100 UNIT/ML FlexPen 3 times a day (just before each meal), 6-5-5 units. Patient taking differently: 5-6 Units 3 (three) times daily with meals. Take 6 units in the morning, Take 5 units in the afternoon & Take 5 units in the evening 02/19/21   Renato Shin, MD  insulin degludec (TRESIBA FLEXTOUCH) 100 UNIT/ML FlexTouch Pen Inject 18 Units into the skin daily. 02/19/21   Renato Shin, MD  levothyroxine (SYNTHROID) 88 MCG tablet Take 88 mcg by mouth daily before breakfast.    [provider]  memantine (NAMENDA) 5 MG tablet Take 5 mg by mouth 2 (two) times daily.    [provider]  Multiple Vitamins-Minerals (MULTIVITAMIN ADULTS) TABS Take 1 tablet by mouth daily.    [provider]  MYRBETRIQ 50 MG TB24 tablet Take 50 mg by mouth daily. 11/27/20   [provider]  rosuvastatin (CRESTOR) 5 MG tablet Take 5 mg by mouth at bedtime.    [provider]  venlafaxine XR (EFFEXOR-XR) 150 MG 24 hr capsule Take 150 mg by mouth daily with breakfast.    [provider]  vitamin B-12 (CYANOCOBALAMIN) 1000 MCG tablet Take 1,000 mcg by mouth daily.    [provider]  Allergies    Codeine and Prednisone  Review of Systems   Review of Systems  Constitutional:  Negative for chills and fever.  HENT:  Negative for ear pain and sore throat.   Eyes:  Negative for pain and visual disturbance.  Respiratory:  Negative for cough and shortness of breath.   Cardiovascular:  Negative for chest pain and palpitations.  Gastrointestinal:  Negative for abdominal pain and vomiting.  Genitourinary:  Negative for dysuria and hematuria.  Musculoskeletal:  Negative for arthralgias and back pain.  Skin:  Negative for color change and rash.  Neurological:   Positive for weakness. Negative for seizures and syncope.  All other systems reviewed and are negative.  Physical Exam Updated Vital Signs BP (!) 153/79   Pulse 74   Temp 98 F (36.7 C) (Oral)   Resp (!) 24   SpO2 95%   Physical Exam Vitals and nursing note reviewed.  Constitutional:      General: She is not in acute distress.    Appearance: She is well-developed. She is ill-appearing (unkempt).  HENT:     Head: Normocephalic and atraumatic.     Nose: Nose normal.     Mouth/Throat:     Mouth: Mucous membranes are moist.  Eyes:     Extraocular Movements: Extraocular movements intact.     Conjunctiva/sclera: Conjunctivae normal.     Pupils: Pupils are equal, round, and reactive to light.  Cardiovascular:     Rate and Rhythm: Normal rate and regular rhythm.     Pulses: Normal pulses.     Heart sounds: Normal heart sounds. No murmur heard. Pulmonary:     Effort: Pulmonary effort is normal. No respiratory distress.     Breath sounds: Normal breath sounds.  Abdominal:     Palpations: Abdomen is soft.     Tenderness: There is no abdominal tenderness.  Musculoskeletal:        General: No swelling.     Cervical back: Neck supple.  Skin:    General: Skin is warm and dry.     Capillary Refill: Capillary refill takes less than 2 seconds.  Neurological:     General: No focal deficit present.     Mental Status: She is alert.     Cranial Nerves: No cranial nerve deficit.     Sensory: No sensory deficit.     Motor: No weakness.     Comments: 5+/5 strength throughout, normal sensation, no drift, normal finger to nose finger  Psychiatric:        Mood and Affect: Mood normal.    ED Results / Procedures / Treatments   Labs (all labs ordered are listed, but only abnormal results are displayed) Labs Reviewed  CBC WITH DIFFERENTIAL/PLATELET - Abnormal; Notable for the following components:      Result Value   Hemoglobin 11.4 (*)    Platelets 409 (*)    All other components  within normal limits  COMPREHENSIVE METABOLIC PANEL - Abnormal; Notable for the following components:   Glucose, Bld 303 (*)    Creatinine, Ser 1.02 (*)    Calcium 8.8 (*)    Albumin 2.9 (*)    GFR, Estimated 57 (*)    All other components within normal limits  CBG MONITORING, ED - Abnormal; Notable for the following components:   Glucose-Capillary 270 (*)    All other components within normal limits  RESP PANEL BY RT-PCR (FLU A&B, COVID) ARPGX2  URINE CULTURE  URINALYSIS, ROUTINE W REFLEX MICROSCOPIC  BASIC METABOLIC PANEL  CBC    EKG None  Radiology DG Chest Portable 1 View  Result Date: 03/05/2021 CLINICAL DATA:  Weakness EXAM: PORTABLE CHEST 1 VIEW COMPARISON:  Chest x-ray 03/04/2021 FINDINGS: The heart and mediastinal contours are unchanged. Aortic calcification. No focal consolidation. No pulmonary edema. No pleural effusion. No pneumothorax. No acute osseous abnormality. Severe degenerative changes of the right shoulder. IMPRESSION: No active disease. Electronically Signed   By: Iven Finn M.D.   On: 03/05/2021 16:01   DG Chest Portable 1 View  Result Date: 03/04/2021 CLINICAL DATA:  Altered mental status, weakness EXAM: PORTABLE CHEST 1 VIEW COMPARISON:  01/24/2021 FINDINGS: The heart size and mediastinal contours are within normal limits. Both lungs are clear. The visualized skeletal structures are unremarkable. Nonacute fracture deformity of the proximal right humerus. IMPRESSION: No acute abnormality of the lungs in AP portable projection. Electronically Signed   By: Delanna Ahmadi M.D.   On: 03/04/2021 14:11    Procedures Procedures   Medications Ordered in ED Medications  rosuvastatin (CRESTOR) tablet 5 mg (has no administration in time range)  donepezil (ARICEPT) tablet 5 mg (has no administration in time range)  memantine (NAMENDA) tablet 5 mg (has no administration in time range)  venlafaxine XR (EFFEXOR-XR) 24 hr capsule 150 mg (has no administration in  time range)  levothyroxine (SYNTHROID) tablet 88 mcg (has no administration in time range)  mirabegron ER (MYRBETRIQ) tablet 50 mg (has no administration in time range)  vitamin B-12 (CYANOCOBALAMIN) tablet 1,000 mcg (has no administration in time range)  Multivitamin Adults TABS 1 tablet (has no administration in time range)  enoxaparin (LOVENOX) injection 40 mg (has no administration in time range)  0.9 %  sodium chloride infusion (has no administration in time range)  insulin aspart (novoLOG) injection 0-9 Units (has no administration in time range)  insulin aspart (novoLOG) injection 0-5 Units (has no administration in time range)  sodium chloride 0.9 % bolus 1,000 mL (1,000 mLs Intravenous New Bag/Given (Non-Interop) 03/05/21 1545)    ED Course  I have reviewed the triage vital signs and the nursing notes.  Pertinent labs & imaging results that were available during my care of the patient were reviewed by me and considered in my medical decision making (see chart for details).    MDM Rules/Calculators/A&P                           Wanda Jones is a 75 year old female with history of dementia, diabetes, recurrent UTIs, high cholesterol.  Patient diagnosed with UTI yesterday.  Was given a dose of fosfomycin.  Lives at independent living.  Has been too weak to get up and ambulate with her walker.  Not eating or drinking as much.  Denies any abdominal pain or nausea or vomiting.  She looks clinically dehydrated on exam.  Frail-appearing.  Vital signs however reassuring.  No fever.  No major leukocytosis.  Creatinine mildly elevated.  Overall suspect that she is more symptomatic from UTI than originally thought yesterday.  She appears to be high risk to get sicker and more dehydrated.  Talked with the hospitalist and believe broaden out oral antibiotics and IV hydration is necessary.  Her blood sugar was very low yesterday in the 40s at times.  Today she is hyperglycemic in the 300s.  Feel like  she would benefit from a hydration, progression of her diet and better diabetes control.  Patient admitted to medicine in stable  condition.  This chart was dictated using voice recognition software.  Despite best efforts to proofread,  errors can occur which can change the documentation meaning.   Final Clinical Impression(s) / ED Diagnoses Final diagnoses:  Weakness  Acute cystitis without hematuria    Rx / DC Orders ED Discharge Orders     None        Lennice Sites, DO 03/05/21 1747

## 2021-03-05 NOTE — Progress Notes (Signed)
Inpatient Diabetes Program Recommendations  AACE/ADA: New Consensus Statement on Inpatient Glycemic Control   Target Ranges:  Prepandial:   less than 140 mg/dL      Peak postprandial:   less than 180 mg/dL (1-2 hours)      Critically ill patients:  140 - 180 mg/dL    Latest Reference Range & Units 03/04/21 17:15 03/04/21 18:41 03/05/21 15:32  Glucose-Capillary 70 - 99 mg/dL 43 (LL) 129 (H) 270 (H)  (LL): Data is critically low (H): Data is abnormally high   Latest Reference Range & Units 03/05/21 15:26  Glucose 70 - 99 mg/dL 303 (H)  (H): Data is abnormally high  Review of Glycemic Control  Diabetes history: DM Outpatient Diabetes medications: Tresiba 18 units daily, Novolog 5 units in the morning 5 units in the afternoon, and 5 units in the evening Current orders for Inpatient glycemic control: None; in ER  Inpatient Diabetes Program Recommendations:    Insulin: If admitted please consider ordering Semglee 18 units Q24H, CBGs Q4H, and Novolog 0-9 units Q4H.   NOTE: Per chart, patient from [facility] via EMS. Patient came to ER on 03/04/21 for weakness and possible UTI and discharged back to [facility] on 03/04/21. Patient brought back today for decreased oral intake and altered mental status.  Noted glucose of 43 mg/dl on 03/04/21 at 17:15 and current lab glucose 303 mg/dl today.   Thanks, Wanda Alderman, RN, MSN, CDE Diabetes Coordinator Inpatient Diabetes Program [phone] (Team Pager from 8am to 5pm)

## 2021-03-06 ENCOUNTER — Encounter (HOSPITAL_COMMUNITY): Payer: Self-pay | Admitting: Internal Medicine

## 2021-03-06 DIAGNOSIS — Z8249 Family history of ischemic heart disease and other diseases of the circulatory system: Secondary | ICD-10-CM | POA: Diagnosis not present

## 2021-03-06 DIAGNOSIS — Z885 Allergy status to narcotic agent status: Secondary | ICD-10-CM | POA: Diagnosis not present

## 2021-03-06 DIAGNOSIS — Z888 Allergy status to other drugs, medicaments and biological substances status: Secondary | ICD-10-CM | POA: Diagnosis not present

## 2021-03-06 DIAGNOSIS — B964 Proteus (mirabilis) (morganii) as the cause of diseases classified elsewhere: Secondary | ICD-10-CM | POA: Diagnosis present

## 2021-03-06 DIAGNOSIS — Z87440 Personal history of urinary (tract) infections: Secondary | ICD-10-CM | POA: Diagnosis not present

## 2021-03-06 DIAGNOSIS — Z794 Long term (current) use of insulin: Secondary | ICD-10-CM | POA: Diagnosis not present

## 2021-03-06 DIAGNOSIS — N390 Urinary tract infection, site not specified: Secondary | ICD-10-CM | POA: Diagnosis not present

## 2021-03-06 DIAGNOSIS — Z20822 Contact with and (suspected) exposure to covid-19: Secondary | ICD-10-CM | POA: Diagnosis present

## 2021-03-06 DIAGNOSIS — E039 Hypothyroidism, unspecified: Secondary | ICD-10-CM | POA: Diagnosis present

## 2021-03-06 DIAGNOSIS — R54 Age-related physical debility: Secondary | ICD-10-CM | POA: Diagnosis present

## 2021-03-06 DIAGNOSIS — R531 Weakness: Secondary | ICD-10-CM

## 2021-03-06 DIAGNOSIS — Z79899 Other long term (current) drug therapy: Secondary | ICD-10-CM | POA: Diagnosis not present

## 2021-03-06 DIAGNOSIS — F0390 Unspecified dementia without behavioral disturbance: Secondary | ICD-10-CM | POA: Diagnosis present

## 2021-03-06 DIAGNOSIS — E1165 Type 2 diabetes mellitus with hyperglycemia: Secondary | ICD-10-CM | POA: Diagnosis present

## 2021-03-06 DIAGNOSIS — R7881 Bacteremia: Secondary | ICD-10-CM | POA: Diagnosis present

## 2021-03-06 DIAGNOSIS — Z79890 Hormone replacement therapy (postmenopausal): Secondary | ICD-10-CM | POA: Diagnosis not present

## 2021-03-06 DIAGNOSIS — E785 Hyperlipidemia, unspecified: Secondary | ICD-10-CM | POA: Diagnosis present

## 2021-03-06 DIAGNOSIS — E860 Dehydration: Secondary | ICD-10-CM | POA: Diagnosis present

## 2021-03-06 LAB — URINALYSIS, ROUTINE W REFLEX MICROSCOPIC
Bacteria, UA: NONE SEEN
Bilirubin Urine: NEGATIVE
Glucose, UA: 150 mg/dL — AB
Hgb urine dipstick: NEGATIVE
Ketones, ur: 5 mg/dL — AB
Nitrite: NEGATIVE
Protein, ur: 100 mg/dL — AB
Specific Gravity, Urine: 1.01 (ref 1.005–1.030)
WBC, UA: >50 WBC/hpf — ABNORMAL HIGH (ref 0–5)
pH: 7 (ref 5.0–8.0)

## 2021-03-06 LAB — BASIC METABOLIC PANEL
Anion gap: 5 (ref 5–15)
BUN: 15 mg/dL (ref 8–23)
CO2: 29 mmol/L (ref 22–32)
Calcium: 8.2 mg/dL — ABNORMAL LOW (ref 8.9–10.3)
Chloride: 104 mmol/L (ref 98–111)
Creatinine, Ser: 0.83 mg/dL (ref 0.44–1.00)
GFR, Estimated: >60 mL/min (ref >60)
Glucose, Bld: 184 mg/dL — ABNORMAL HIGH (ref 70–99)
Potassium: 3.7 mmol/L (ref 3.5–5.1)
Sodium: 138 mmol/L (ref 135–145)

## 2021-03-06 LAB — CBC
HCT: 34.8 % — ABNORMAL LOW (ref 36.0–46.0)
Hemoglobin: 11.3 g/dL — ABNORMAL LOW (ref 12.0–15.0)
MCH: 28.5 pg (ref 26.0–34.0)
MCHC: 32.5 g/dL (ref 30.0–36.0)
MCV: 87.9 fL (ref 80.0–100.0)
Platelets: 390 10*3/uL (ref 150–400)
RBC: 3.96 MIL/uL (ref 3.87–5.11)
RDW: 12.4 % (ref 11.5–15.5)
WBC: 8.5 10*3/uL (ref 4.0–10.5)
nRBC: 0 % (ref 0.0–0.2)

## 2021-03-06 LAB — GLUCOSE, CAPILLARY
Glucose-Capillary: 138 mg/dL — ABNORMAL HIGH (ref 70–99)
Glucose-Capillary: 276 mg/dL — ABNORMAL HIGH (ref 70–99)
Glucose-Capillary: 232 mg/dL — ABNORMAL HIGH (ref 70–99)
Glucose-Capillary: 430 mg/dL — ABNORMAL HIGH (ref 70–99)

## 2021-03-06 MED ORDER — ADULT MULTIVITAMIN W/MINERALS CH
1.0000 | ORAL_TABLET | Freq: Every day | ORAL | Status: DC
Start: 2021-03-06 — End: 2021-03-07
  Administered 2021-03-06 – 2021-03-07 (×2): 1 via ORAL
  Filled 2021-03-06: qty 1

## 2021-03-06 MED ORDER — MIRABEGRON ER 25 MG PO TB24
50.0000 mg | ORAL_TABLET | Freq: Every day | ORAL | Status: DC
  Administered 2021-03-06 – 2021-03-07 (×2): 50 mg via ORAL
  Filled 2021-03-06 (×2): qty 2

## 2021-03-06 MED ORDER — VENLAFAXINE HCL ER 150 MG PO CP24
150.0000 mg | ORAL_CAPSULE | Freq: Every day | ORAL | Status: DC
  Administered 2021-03-07: 150 mg via ORAL
  Filled 2021-03-06: qty 1

## 2021-03-06 MED ORDER — ROSUVASTATIN CALCIUM 5 MG PO TABS
5.0000 mg | ORAL_TABLET | Freq: Every day | ORAL | Status: DC
  Administered 2021-03-06: 5 mg via ORAL
  Filled 2021-03-06: qty 1

## 2021-03-06 MED ORDER — DONEPEZIL HCL 10 MG PO TABS
5.0000 mg | ORAL_TABLET | Freq: Every day | ORAL | Status: DC
  Administered 2021-03-06: 5 mg via ORAL
  Filled 2021-03-06: qty 1

## 2021-03-06 MED ORDER — VITAMIN B-12 1000 MCG PO TABS
1000.0000 ug | ORAL_TABLET | Freq: Every day | ORAL | Status: DC
  Administered 2021-03-06 – 2021-03-07 (×2): 1000 ug via ORAL
  Filled 2021-03-06 (×2): qty 1

## 2021-03-06 MED ORDER — MEMANTINE HCL 10 MG PO TABS
5.0000 mg | ORAL_TABLET | Freq: Two times a day (BID) | ORAL | Status: DC
  Administered 2021-03-06 – 2021-03-07 (×2): 5 mg via ORAL
  Filled 2021-03-06 (×2): qty 1

## 2021-03-06 MED ORDER — INSULIN ASPART 100 UNIT/ML IJ SOLN
8.0000 [IU] | Freq: Once | INTRAMUSCULAR | Status: AC
  Administered 2021-03-06: 8 [IU] via SUBCUTANEOUS

## 2021-03-06 NOTE — Evaluation (Signed)
Physical Therapy Evaluation Patient Details Name: Wanda Jones MRN: [id number] DOB: 25-Feb-1946 Today's Date: 03/06/2021  History of Present Illness  75 yo female admitted with UTI, weaknesss. Hx of dementia, breast ca, CAD, DM.  Clinical Impression  On eval, pt required Min guard assist for mobility. She walked ~165 feet with a rollator. Pt tolerated distance well. No family present during session. Unsure of supv/assistance available to pt at her Ind Living facility. Will plan to follow during hospital stay. PT recommendation is for HHPT if pt/family are agreeable.        Recommendations for follow up therapy are one component of a multi-disciplinary discharge planning process, led by the attending physician.  Recommendations may be updated based on patient status, additional functional criteria and insurance authorization.  Follow Up Recommendations Home health PT    Assistance Recommended at Discharge Frequent or constant Supervision/Assistance (initially until pt returns to her baseline)   Functional Status Assessment Patient has had a recent decline in their functional status and demonstrates the ability to make significant improvements in function in a reasonable and predictable amount of time.  Equipment Recommendations  None recommended by PT    Recommendations for Other Services       Precautions / Restrictions Precautions Precautions: Fall Restrictions Weight Bearing Restrictions: No      Mobility  Bed Mobility Overal bed mobility: Needs Assistance Bed Mobility: Supine to Sit     Supine to sit: Min guard;HOB elevated     General bed mobility comments: Min guard for safety. Increased time. Cues required.    Transfers Overall transfer level: Needs assistance Equipment used: Rollator (4 wheels) Transfers: Sit to/from Stand Sit to Stand: Min guard           General transfer comment: Increased time. Min guard for safety. Cues for safety, technique, proper  operation of rollator    Ambulation/Gait Ambulation/Gait assistance: Min guard Gait Distance (Feet): 165 Feet Assistive device: Rollator (4 wheels) Gait Pattern/deviations: Step-through pattern;Decreased stride length       General Gait Details: Min guard for safety. Slow gait speed. Pt tolerated distance well. Denied dizziness.  Stairs            Wheelchair Mobility    Modified Rankin (Stroke Patients Only)       Balance Overall balance assessment: Needs assistance         Standing balance support: Bilateral upper extremity supported;Reliant on assistive device for balance;During functional activity Standing balance-Leahy Scale: Poor                               Pertinent Vitals/Pain Pain Assessment: No/denies pain    Home Living Family/patient expects to be discharged to:: Private residence     Type of Home: Independent living facility Home Access: Level entry       Home Layout: One level Home Equipment: Rollator (4 wheels);Grab bars - tub/shower Additional Comments: pt has walk-in shower with seat, grab bars, hand held shower.    Prior Function               Mobility Comments: uses rollator for ambulation. walks to dining room       Hand Dominance        Extremity/Trunk Assessment   Upper Extremity Assessment Upper Extremity Assessment: Defer to OT evaluation    Lower Extremity Assessment Lower Extremity Assessment: Generalized weakness    Cervical / Trunk Assessment Cervical / Trunk Assessment: Normal  Communication   Communication: No difficulties  Cognition Arousal/Alertness: Awake/alert Behavior During Therapy: WFL for tasks assessed/performed Overall Cognitive Status: History of cognitive impairments - at baseline                                          General Comments      Exercises     Assessment/Plan    PT Assessment Patient needs continued PT services  PT Problem List  Decreased strength;Decreased mobility;Decreased balance;Decreased activity tolerance;Decreased knowledge of use of DME;Decreased cognition       PT Treatment Interventions DME instruction;Gait training;Therapeutic activities;Therapeutic exercise;Patient/family education;Balance training;Functional mobility training    PT Goals (Current goals can be found in the Care Plan section)  Acute Rehab PT Goals Patient Stated Goal: to regain PLOF PT Goal Formulation: With patient Time For Goal Achievement: 03/20/21 Potential to Achieve Goals: Good    Frequency Min 3X/week   Barriers to discharge        Co-evaluation               AM-PAC PT "6 Clicks" Mobility  Outcome Measure Help needed turning from your back to your side while in a flat bed without using bedrails?: A Little Help needed moving from lying on your back to sitting on the side of a flat bed without using bedrails?: A Little Help needed moving to and from a bed to a chair (including a wheelchair)?: A Little Help needed standing up from a chair using your arms (e.g., wheelchair or bedside chair)?: A Little Help needed to walk in hospital room?: A Little Help needed climbing 3-5 steps with a railing? : A Little 6 Click Score: 18    End of Session Equipment Utilized During Treatment: Gait belt Activity Tolerance: Patient tolerated treatment well Patient left: in chair;with call bell/phone within reach;with chair alarm set   PT Visit Diagnosis: Muscle weakness (generalized) (M62.81);Difficulty in walking, not elsewhere classified (R26.2)    Time: 2426-8341 PT Time Calculation (min) (ACUTE ONLY): 19 min   Charges:   PT Evaluation $PT Eval Moderate Complexity: 1 Mod            Doreatha Massed, PT Acute Rehabilitation  Office: [phone] Pager: [phone]

## 2021-03-06 NOTE — Plan of Care (Signed)
  Problem: Education: Goal: Knowledge of General Education information will improve Description: Including pain rating scale, medication(s)/side effects and non-pharmacologic comfort measures Outcome: Progressing   Problem: Coping: Goal: Level of anxiety will decrease Outcome: Progressing   Problem: Elimination: Goal: Will not experience complications related to bowel motility Outcome: Progressing Goal: Will not experience complications related to urinary retention Outcome: Progressing   Problem: Pain Managment: Goal: General experience of comfort will improve Outcome: Progressing   

## 2021-03-06 NOTE — Progress Notes (Signed)
PROGRESS NOTE    Blossie Raffel  VVO:[id number] DOB: 1946/01/20 DOA: 03/05/2021 PCP: Pcp, No   Chief Complain: Weakness, decreased oral intake  Brief Narrative: Wanda Jones is a 75 y.o. female with medical history significant of diabetes type 2, dementia, recurrent UTI.  Hyperlipidemia, hypothyroidism who presented from independent living facility after she became progressively weak.  She was also having poor oral intake, fatigue and also became more confused than at baseline.  This was going on for last 2 weeks.  She was brought to the emergency department here on 03/04/2021.  UTI was suspected after urinalysis was positive and she was given a dose of fosfomycin.  Patient became too weak to get up and was unable to ambulate with walker.  She consistently had poor oral intake and looked dehydrated on examination.  Patient was requested to be admitted for consistent poor oral intake, weakness and the need of IV antibiotics, IV fluids. Overall status has significantly improved today.  Assessment & Plan:   Principal Problem:   UTI (urinary tract infection) Active Problems:   Hyperglycemia   Diabetes mellitus ([place])   Hyperlipidemia   Dementia ([facility])    Weakness/fatigue/poor oral intake/dehydration: Could be from urinary tract infection.  She has history of recurrent UTI.  Continue gentle IV fluids, current management for UTI   Recurrent UTI/suspected bacteremia/sepsis: Urinalysis analysis positive of urinary tract infection with significant leukocytes, leukocyte esterase positivity.  We will follow-up urine culture and blood culture.  Currently she is hemodynamically stable.  She was admitted in October this year for the same when she was diagnosed with bacteremia/urinary tract infection with Proteus.  Continue ceftriaxone 2gm daily for now.   Type 2 diabetes: Monitor blood sugars.  Continue sliding-scale insulin  and lantus for now.  Recent hemoglobin A1c of 9.6 as per 9/22.  Diabetic  coordinator following.  She follows with endocrinologist as an outpatient.  Her diabetes is uncontrolled.   History of dementia: More confused than this baseline.  Could be from UTI.  Continue delirium precautions.  Takes memantine, donepezil which we will continue.  Continue supportive care   History of hyperlipidemia: takes Crestor.  We will continue   Deconditioning/debility: Ambulates with the help of walker.  Consulted PT/OT         DVT prophylaxis:Lovenox Code Status: Full Family Communication: Called son on phone on 11/18,11/19 ,calls not Received Status is: Observation    Consultants: None  Procedures: None  Antimicrobials:  Anti-infectives (From admission, onward)    Start     Dose/Rate Route Frequency Ordered Stop   03/05/21 2100  cefTRIAXone (ROCEPHIN) 2 g in sodium chloride 0.9 % 100 mL IVPB        2 g 200 mL/hr over 30 Minutes Intravenous Every 24 hours 03/05/21 1806         Subjective:  Patient seen and examined the bedside this morning.  Hemodynamically stable.  Comfortable today.  She feels much better today.  She denies any new complaints   Objective: Vitals:   03/05/21 1630 03/05/21 1830 03/05/21 2055 03/06/21 0417  BP: (!) 153/79 (!) 168/78  136/68  Pulse: 74 79  83  Resp: (!) 24 16  16   Temp:    98.9 F (37.2 C)  TempSrc:    Oral  SpO2: 95% 93%  95%  Weight:   59.5 kg   Height:   5\' 3"  (1.6 m)     Intake/Output Summary (Last 24 hours) at 03/06/2021 0739 Last data filed at 03/06/2021  0425 Gross per 24 hour  Intake 589.04 ml  Output 1200 ml  Net -610.96 ml   Filed Weights   03/05/21 2055  Weight: 59.5 kg    Examination:  General exam: Overall comfortable, not in distress, pleasant elderly female, weak, frail, chronically looking HEENT: PERRL Respiratory system:  no wheezes or crackles  Cardiovascular system: S1 & S2 heard, RRR.  Gastrointestinal system: Abdomen is nondistended, soft and nontender. Central nervous system: Alert  and oriented Extremities: No edema, no clubbing ,no cyanosis Skin: No rashes, no ulcers,no icterus      Data Reviewed: I have personally reviewed following labs and imaging studies  CBC: Recent Labs  Lab 03/04/21 1327 03/05/21 1526 03/06/21 0534  WBC 10.3 9.2 8.5  NEUTROABS 7.3 6.8  --   HGB 12.2 11.4* 11.3*  HCT 38.0 36.4 34.8*  MCV 88.8 89.9 87.9  PLT 465* 409* 017   Basic Metabolic Panel: Recent Labs  Lab 03/04/21 1327 03/05/21 1526 03/06/21 0534  NA 142 138 138  K 4.3 4.4 3.7  CL 101 103 104  CO2 30 28 29   GLUCOSE 71 303* 184*  BUN 26* 22 15  CREATININE 1.11* 1.02* 0.83  CALCIUM 9.6 8.8* 8.2*   GFR: Estimated Creatinine Clearance: 47.7 mL/min (by C-G formula based on SCr of 0.83 mg/dL). Liver Function Tests: Recent Labs  Lab 03/04/21 1327 03/05/21 1526  AST 17 17  ALT 17 17  ALKPHOS 95 86  BILITOT 0.4 0.3  PROT 8.0 7.1  ALBUMIN 3.3* 2.9*   No results for input(s): LIPASE, AMYLASE in the last 168 hours. No results for input(s): AMMONIA in the last 168 hours. Coagulation Profile: No results for input(s): INR, PROTIME in the last 168 hours. Cardiac Enzymes: No results for input(s): CKTOTAL, CKMB, CKMBINDEX, TROPONINI in the last 168 hours. BNP (last 3 results) No results for input(s): PROBNP in the last 8760 hours. HbA1C: No results for input(s): HGBA1C in the last 72 hours. CBG: Recent Labs  Lab 03/04/21 1715 03/04/21 1841 03/05/21 1532 03/05/21 2048 03/06/21 0726  GLUCAP 43* 129* 270* 194* 138*   Lipid Profile: No results for input(s): CHOL, HDL, LDLCALC, TRIG, CHOLHDL, LDLDIRECT in the last 72 hours. Thyroid Function Tests: No results for input(s): TSH, T4TOTAL, FREET4, T3FREE, THYROIDAB in the last 72 hours. Anemia Panel: No results for input(s): VITAMINB12, FOLATE, FERRITIN, TIBC, IRON, RETICCTPCT in the last 72 hours. Sepsis Labs: Recent Labs  Lab 03/04/21 1327  LATICACIDVEN 1.0    Recent Results (from the past 240 hour(s))   Resp Panel by RT-PCR (Flu A&B, Covid) Nasopharyngeal Swab     Status: None   Collection Time: 03/04/21  1:27 PM   Specimen: Nasopharyngeal Swab; Nasopharyngeal(NP) swabs in vial transport medium  Result Value Ref Range Status   SARS Coronavirus 2 by RT PCR NEGATIVE NEGATIVE Final    Comment: (NOTE) SARS-CoV-2 target nucleic acids are NOT DETECTED.  The SARS-CoV-2 RNA is generally detectable in upper respiratory specimens during the acute phase of infection. The lowest concentration of SARS-CoV-2 viral copies this assay can detect is 138 copies/mL. A negative result does not preclude SARS-Cov-2 infection and should not be used as the sole basis for treatment or other patient management decisions. A negative result may occur with  improper specimen collection/handling, submission of specimen other than nasopharyngeal swab, presence of viral mutation(s) within the areas targeted by this assay, and inadequate number of viral copies(<138 copies/mL). A negative result must be combined with clinical observations, patient history, and  epidemiological information. The expected result is Negative.  Fact Sheet for Patients:  EntrepreneurPulse.com.au  Fact Sheet for Healthcare Providers:  IncredibleEmployment.be  This test is no t yet approved or cleared by the [place] FDA and  has been authorized for detection and/or diagnosis of SARS-CoV-2 by FDA under an Emergency Use Authorization (EUA). This EUA will remain  in effect (meaning this test can be used) for the duration of the COVID-19 declaration under Section 564(b)(1) of the Act, 21 U.S.C.section 360bbb-3(b)(1), unless the authorization is terminated  or revoked sooner.       Influenza A by PCR NEGATIVE NEGATIVE Final   Influenza B by PCR NEGATIVE NEGATIVE Final    Comment: (NOTE) The Xpert Xpress SARS-CoV-2/FLU/RSV plus assay is intended as an aid in the diagnosis of influenza from  Nasopharyngeal swab specimens and should not be used as a sole basis for treatment. Nasal washings and aspirates are unacceptable for Xpert Xpress SARS-CoV-2/FLU/RSV testing.  Fact Sheet for Patients: EntrepreneurPulse.com.au  Fact Sheet for Healthcare Providers: IncredibleEmployment.be  This test is not yet approved or cleared by the [place] FDA and has been authorized for detection and/or diagnosis of SARS-CoV-2 by FDA under an Emergency Use Authorization (EUA). This EUA will remain in effect (meaning this test can be used) for the duration of the COVID-19 declaration under Section 564(b)(1) of the Act, 21 U.S.C. section 360bbb-3(b)(1), unless the authorization is terminated or revoked.  Performed at [facility], [place] [address] [place], [place] [address]   Resp Panel by RT-PCR (Flu A&B, Covid) Nasopharyngeal Swab     Status: None   Collection Time: 03/05/21  3:26 PM   Specimen: Nasopharyngeal Swab; Nasopharyngeal(NP) swabs in vial transport medium  Result Value Ref Range Status   SARS Coronavirus 2 by RT PCR NEGATIVE NEGATIVE Final    Comment: (NOTE) SARS-CoV-2 target nucleic acids are NOT DETECTED.  The SARS-CoV-2 RNA is generally detectable in upper respiratory specimens during the acute phase of infection. The lowest concentration of SARS-CoV-2 viral copies this assay can detect is 138 copies/mL. A negative result does not preclude SARS-Cov-2 infection and should not be used as the sole basis for treatment or other patient management decisions. A negative result may occur with  improper specimen collection/handling, submission of specimen other than nasopharyngeal swab, presence of viral mutation(s) within the areas targeted by this assay, and inadequate number of viral copies(<138 copies/mL). A negative result must be combined with clinical observations, patient history, and epidemiological information. The  expected result is Negative.  Fact Sheet for Patients:  EntrepreneurPulse.com.au  Fact Sheet for Healthcare Providers:  IncredibleEmployment.be  This test is no t yet approved or cleared by the [place] FDA and  has been authorized for detection and/or diagnosis of SARS-CoV-2 by FDA under an Emergency Use Authorization (EUA). This EUA will remain  in effect (meaning this test can be used) for the duration of the COVID-19 declaration under Section 564(b)(1) of the Act, 21 U.S.C.section 360bbb-3(b)(1), unless the authorization is terminated  or revoked sooner.       Influenza A by PCR NEGATIVE NEGATIVE Final   Influenza B by PCR NEGATIVE NEGATIVE Final    Comment: (NOTE) The Xpert Xpress SARS-CoV-2/FLU/RSV plus assay is intended as an aid in the diagnosis of influenza from Nasopharyngeal swab specimens and should not be used as a sole basis for treatment. Nasal washings and aspirates are unacceptable for Xpert Xpress SARS-CoV-2/FLU/RSV testing.  Fact Sheet for Patients: EntrepreneurPulse.com.au  Fact Sheet for  Healthcare Providers: IncredibleEmployment.be  This test is not yet approved or cleared by the [place] and has been authorized for detection and/or diagnosis of SARS-CoV-2 by FDA under an Emergency Use Authorization (EUA). This EUA will remain in effect (meaning this test can be used) for the duration of the COVID-19 declaration under Section 564(b)(1) of the Act, 21 U.S.C. section 360bbb-3(b)(1), unless the authorization is terminated or revoked.  Performed at [facility], [place] [address] [place], [place] [address]          Radiology Studies: DG Chest Portable 1 View  Result Date: 03/05/2021 CLINICAL DATA:  Weakness EXAM: PORTABLE CHEST 1 VIEW COMPARISON:  Chest x-ray 03/04/2021 FINDINGS: The heart and mediastinal contours are unchanged. Aortic  calcification. No focal consolidation. No pulmonary edema. No pleural effusion. No pneumothorax. No acute osseous abnormality. Severe degenerative changes of the right shoulder. IMPRESSION: No active disease. Electronically Signed   By: Iven Finn M.D.   On: 03/05/2021 16:01   DG Chest Portable 1 View  Result Date: 03/04/2021 CLINICAL DATA:  Altered mental status, weakness EXAM: PORTABLE CHEST 1 VIEW COMPARISON:  01/24/2021 FINDINGS: The heart size and mediastinal contours are within normal limits. Both lungs are clear. The visualized skeletal structures are unremarkable. Nonacute fracture deformity of the proximal right humerus. IMPRESSION: No acute abnormality of the lungs in AP portable projection. Electronically Signed   By: Delanna Ahmadi M.D.   On: 03/04/2021 14:11        Scheduled Meds:  donepezil  5 mg Oral QHS   enoxaparin (LOVENOX) injection  40 mg Subcutaneous Q24H   insulin aspart  0-5 Units Subcutaneous QHS   insulin aspart  0-9 Units Subcutaneous TID WC   insulin glargine-yfgn  15 Units Subcutaneous QHS   levothyroxine  88 mcg Oral QAC breakfast   memantine  5 mg Oral BID   mirabegron ER  50 mg Oral Daily   Multivitamin Adults  1 tablet Oral Daily   rosuvastatin  5 mg Oral QHS   venlafaxine XR  150 mg Oral Q breakfast   vitamin B-12  1,000 mcg Oral Daily   Continuous Infusions:  sodium chloride 100 mL/hr at 03/05/21 2244   cefTRIAXone (ROCEPHIN)  IV 2 g (03/05/21 2246)     LOS: 0 days    Time spent: 25 mins.More than 50% of that time was spent in counseling and/or coordination of care.      Shelly Coss, MD [facility] P11/19/2022, 7:39 AM

## 2021-03-07 DIAGNOSIS — L8990 Pressure ulcer of unspecified site, unspecified stage: Secondary | ICD-10-CM | POA: Insufficient documentation

## 2021-03-07 LAB — URINE CULTURE: Culture: 40000 — AB

## 2021-03-07 LAB — GLUCOSE, CAPILLARY
Glucose-Capillary: 141 mg/dL — ABNORMAL HIGH (ref 70–99)
Glucose-Capillary: 220 mg/dL — ABNORMAL HIGH (ref 70–99)
Glucose-Capillary: 242 mg/dL — ABNORMAL HIGH (ref 70–99)

## 2021-03-07 MED ORDER — CEPHALEXIN 500 MG PO CAPS: 500.0000 mg | ORAL_CAPSULE | Freq: Three times a day (TID) | ORAL | 0 refills | Status: AC

## 2021-03-07 NOTE — Discharge Summary (Signed)
Physician Discharge Summary  Monette Omara STM:[id number] DOB: [birth year] DOA: 03/05/2021  PCP: Pcp, No  Admit date: 03/05/2021 Discharge date: 03/07/2021  Admitted From: Home Disposition:  Home  Discharge Condition:Stable CODE STATUS:FULL Diet recommendation: Carbohydrate consistent  Brief/Interim Summary:  Wanda Jones is a 75 y.o. female with medical history significant of diabetes type 2, dementia, recurrent UTI.  Hyperlipidemia, hypothyroidism who presented from independent living facility after she became progressively weak.  She was also having poor oral intake, fatigue and also became more confused than at baseline.  This was going on for last 2 weeks.  She was brought to the emergency department here on 03/04/2021.  UTI was suspected after urinalysis was positive and she was given a dose of fosfomycin.  Patient became too weak to get up and was unable to ambulate with walker.  She consistently had poor oral intake and looked dehydrated on examination.  Patient was requested to be admitted for consistent poor oral intake, weakness and the need of IV antibiotics, IV fluids. Overall status has significantly improved.  Her urine culture has showed 40,000 colonies of Proteus, pansensitive.  She is hemodynamically stable for discharge today with oral antibiotics.  PT recommended home health on discharge.  Following problems were addressed during her hospitalization:   Weakness/fatigue/poor oral intake/dehydration: Likely from urinary tract infection.  She has history of recurrent UTI.  Treated with IV fluids, she looks significantly hydrated now.  Weakness has significantly improved.    Recurrent UTI/suspected bacteremia/sepsis: Urinalysis analysis positive of urinary tract infection with significant leukocytes, leukocyte esterase positivity.   She was admitted in October this year for the same when she was diagnosed with bacteremia/urinary tract infection with Proteus. She was on  ceftriaxone 2gm daily. Urine culture showed 40,000 colonies of Proteus, pansensitive.  Antibiotics changed to oral.   Type 2 diabetes: Recent hemoglobin A1c of 9.6 as per 9/22. She follows with endocrinologist as an outpatient.  Her diabetes is uncontrolled.  Continue home insulin regimen.   History of dementia: She was more confused than this baseline on admission.  Could be from UTI.  Mental status currently at baseline, she is alert and oriented.  Takes memantine, donepezil which we will continue.  Continue supportive care   History of hyperlipidemia: takes Crestor.  We will continue   Deconditioning/debility: Ambulates with the help of walker.  PT recommended home health.    Discharge Diagnoses:  Principal Problem:   UTI (urinary tract infection) Active Problems:   Hyperglycemia   Diabetes mellitus ([place])   Hyperlipidemia   Dementia ([facility])   Weakness   Pressure injury of skin    Discharge Instructions  Discharge Instructions     Diet Carb Modified   Complete by: As directed    Discharge instructions   Complete by: As directed    1)Please take prescribed medication as instructed 2)Follow up with your PCP in a week 3)Follow up with home health   Increase activity slowly   Complete by: As directed    No wound care   Complete by: As directed       Allergies as of 03/07/2021       Reactions   Codeine Anaphylaxis   Prednisone Anaphylaxis        Medication List     TAKE these medications    acetaminophen 325 MG tablet Commonly known as: TYLENOL Take 325 mg by mouth every 6 (six) hours as needed for moderate pain or headache.   cephALEXin 500 MG capsule Commonly known  as: KEFLEX Take 1 capsule (500 mg total) by mouth 3 (three) times daily for 3 days.   cetirizine 10 MG tablet Commonly known as: ZYRTEC Take 10 mg by mouth daily as needed for allergies.   clobetasol 0.05 % external solution Commonly known as: TEMOVATE Apply 1 application topically 2  (two) times daily as needed (irritation).   donepezil 5 MG tablet Commonly known as: ARICEPT Take 5 mg by mouth at bedtime.   levothyroxine 88 MCG tablet Commonly known as: SYNTHROID Take 88 mcg by mouth daily before breakfast.   memantine 5 MG tablet Commonly known as: NAMENDA Take 5 mg by mouth 2 (two) times daily.   Multivitamin Adults Tabs Take 1 tablet by mouth daily.   Myrbetriq 50 MG Tb24 tablet Generic drug: mirabegron ER Take 50 mg by mouth daily.   NovoLOG FlexPen 100 UNIT/ML FlexPen Generic drug: insulin aspart 3 times a day (just before each meal), 6-5-5 units. What changed:  how much to take when to take this additional instructions   rosuvastatin 5 MG tablet Commonly known as: CRESTOR Take 5 mg by mouth at bedtime.   [name] FlexTouch 100 UNIT/ML FlexTouch Pen Generic drug: insulin degludec Inject 18 Units into the skin daily.   venlafaxine XR 150 MG 24 hr capsule Commonly known as: EFFEXOR-XR Take 150 mg by mouth daily with breakfast.   vitamin B-12 1000 MCG tablet Commonly known as: CYANOCOBALAMIN Take 1,000 mcg by mouth daily.        Allergies  Allergen Reactions   Codeine Anaphylaxis   Prednisone Anaphylaxis    Consultations: None   Procedures/Studies: DG Chest Portable 1 View  Result Date: 03/05/2021 CLINICAL DATA:  Weakness EXAM: PORTABLE CHEST 1 VIEW COMPARISON:  Chest x-ray 03/04/2021 FINDINGS: The heart and mediastinal contours are unchanged. Aortic calcification. No focal consolidation. No pulmonary edema. No pleural effusion. No pneumothorax. No acute osseous abnormality. Severe degenerative changes of the right shoulder. IMPRESSION: No active disease. Electronically Signed   By: Iven Finn M.D.   On: 03/05/2021 16:01   DG Chest Portable 1 View  Result Date: 03/04/2021 CLINICAL DATA:  Altered mental status, weakness EXAM: PORTABLE CHEST 1 VIEW COMPARISON:  01/24/2021 FINDINGS: The heart size and mediastinal contours are  within normal limits. Both lungs are clear. The visualized skeletal structures are unremarkable. Nonacute fracture deformity of the proximal right humerus. IMPRESSION: No acute abnormality of the lungs in AP portable projection. Electronically Signed   By: Delanna Ahmadi M.D.   On: 03/04/2021 14:11      Subjective:  Patient seen and examined at the bedside this morning.  Hemodynamically stable.  Comfortable.  Medically stable for discharge.  I have called her son several times for update but unsuccessful.   Discharge Exam: Vitals:   03/06/21 0859 03/06/21 1411  BP: (!) 104/48 (!) 111/59  Pulse: 90 87  Resp: 20 20  Temp: 98.9 F (37.2 C) 97.6 F (36.4 C)  SpO2: 96% 97%   Vitals:   03/05/21 2055 03/06/21 0417 03/06/21 0859 03/06/21 1411  BP:  136/68 (!) 104/48 (!) 111/59  Pulse:  83 90 87  Resp:  16 20 20   Temp:  98.9 F (37.2 C) 98.9 F (37.2 C) 97.6 F (36.4 C)  TempSrc:  Oral Oral Oral  SpO2:  95% 96% 97%  Weight: 59.5 kg     Height: 5\' 3"  (1.6 m)       General: Pt is alert, awake, not in acute distress, pleasant elderly female Cardiovascular:  RRR, S1/S2 +, no rubs, no gallops Respiratory: CTA bilaterally, no wheezing, no rhonchi Abdominal: Soft, NT, ND, bowel sounds + Extremities: no edema, no cyanosis    The results of significant diagnostics from this hospitalization (including imaging, microbiology, ancillary and laboratory) are listed below for reference.     Microbiology: Recent Results (from the past 240 hour(s))  Resp Panel by RT-PCR (Flu A&B, Covid) Nasopharyngeal Swab     Status: None   Collection Time: 03/04/21  1:27 PM   Specimen: Nasopharyngeal Swab; Nasopharyngeal(NP) swabs in vial transport medium  Result Value Ref Range Status   SARS Coronavirus 2 by RT PCR NEGATIVE NEGATIVE Final    Comment: (NOTE) SARS-CoV-2 target nucleic acids are NOT DETECTED.  The SARS-CoV-2 RNA is generally detectable in upper respiratory specimens during the acute phase  of infection. The lowest concentration of SARS-CoV-2 viral copies this assay can detect is 138 copies/mL. A negative result does not preclude SARS-Cov-2 infection and should not be used as the sole basis for treatment or other patient management decisions. A negative result may occur with  improper specimen collection/handling, submission of specimen other than nasopharyngeal swab, presence of viral mutation(s) within the areas targeted by this assay, and inadequate number of viral copies(<138 copies/mL). A negative result must be combined with clinical observations, patient history, and epidemiological information. The expected result is Negative.  Fact Sheet for Patients:  EntrepreneurPulse.com.au  Fact Sheet for Healthcare Providers:  IncredibleEmployment.be  This test is no t yet approved or cleared by the [place] FDA and  has been authorized for detection and/or diagnosis of SARS-CoV-2 by FDA under an Emergency Use Authorization (EUA). This EUA will remain  in effect (meaning this test can be used) for the duration of the COVID-19 declaration under Section 564(b)(1) of the Act, 21 U.S.C.section 360bbb-3(b)(1), unless the authorization is terminated  or revoked sooner.       Influenza A by PCR NEGATIVE NEGATIVE Final   Influenza B by PCR NEGATIVE NEGATIVE Final    Comment: (NOTE) The Xpert Xpress SARS-CoV-2/FLU/RSV plus assay is intended as an aid in the diagnosis of influenza from Nasopharyngeal swab specimens and should not be used as a sole basis for treatment. Nasal washings and aspirates are unacceptable for Xpert Xpress SARS-CoV-2/FLU/RSV testing.  Fact Sheet for Patients: EntrepreneurPulse.com.au  Fact Sheet for Healthcare Providers: IncredibleEmployment.be  This test is not yet approved or cleared by the [place] FDA and has been authorized for detection and/or diagnosis of  SARS-CoV-2 by FDA under an Emergency Use Authorization (EUA). This EUA will remain in effect (meaning this test can be used) for the duration of the COVID-19 declaration under Section 564(b)(1) of the Act, 21 U.S.C. section 360bbb-3(b)(1), unless the authorization is terminated or revoked.  Performed at [facility], [place] [address] [place], [place] [address]   Urine Culture     Status: Abnormal   Collection Time: 03/04/21  6:29 PM   Specimen: Urine, Clean Catch  Result Value Ref Range Status   Specimen Description   Final    URINE, CLEAN CATCH Performed at [facility], [place] [address] [place], [place] [address]    Special Requests   Final    NONE Performed at [facility], [place] [address], [place], [place] [address]    Culture 40,000 COLONIES/mL PROTEUS MIRABILIS (A)  Final   Report Status 03/07/2021 FINAL  Final   Organism ID, Bacteria PROTEUS MIRABILIS (A)  Final  Susceptibility   Proteus mirabilis - MIC*    AMPICILLIN <=2 SENSITIVE Sensitive     CEFAZOLIN <=4 SENSITIVE Sensitive     CEFEPIME <=0.12 SENSITIVE Sensitive     CEFTRIAXONE <=0.25 SENSITIVE Sensitive     CIPROFLOXACIN <=0.25 SENSITIVE Sensitive     GENTAMICIN <=1 SENSITIVE Sensitive     IMIPENEM 2 SENSITIVE Sensitive     NITROFURANTOIN 128 RESISTANT Resistant     TRIMETH/SULFA <=20 SENSITIVE Sensitive     AMPICILLIN/SULBACTAM <=2 SENSITIVE Sensitive     PIP/TAZO <=4 SENSITIVE Sensitive     * 40,000 COLONIES/mL PROTEUS MIRABILIS  Resp Panel by RT-PCR (Flu A&B, Covid) Nasopharyngeal Swab     Status: None   Collection Time: 03/05/21  3:26 PM   Specimen: Nasopharyngeal Swab; Nasopharyngeal(NP) swabs in vial transport medium  Result Value Ref Range Status   SARS Coronavirus 2 by RT PCR NEGATIVE NEGATIVE Final    Comment: (NOTE) SARS-CoV-2 target nucleic acids are NOT DETECTED.  The SARS-CoV-2 RNA is generally detectable in upper respiratory specimens  during the acute phase of infection. The lowest concentration of SARS-CoV-2 viral copies this assay can detect is 138 copies/mL. A negative result does not preclude SARS-Cov-2 infection and should not be used as the sole basis for treatment or other patient management decisions. A negative result may occur with  improper specimen collection/handling, submission of specimen other than nasopharyngeal swab, presence of viral mutation(s) within the areas targeted by this assay, and inadequate number of viral copies(<138 copies/mL). A negative result must be combined with clinical observations, patient history, and epidemiological information. The expected result is Negative.  Fact Sheet for Patients:  EntrepreneurPulse.com.au  Fact Sheet for Healthcare Providers:  IncredibleEmployment.be  This test is no t yet approved or cleared by the [place] FDA and  has been authorized for detection and/or diagnosis of SARS-CoV-2 by FDA under an Emergency Use Authorization (EUA). This EUA will remain  in effect (meaning this test can be used) for the duration of the COVID-19 declaration under Section 564(b)(1) of the Act, 21 U.S.C.section 360bbb-3(b)(1), unless the authorization is terminated  or revoked sooner.       Influenza A by PCR NEGATIVE NEGATIVE Final   Influenza B by PCR NEGATIVE NEGATIVE Final    Comment: (NOTE) The Xpert Xpress SARS-CoV-2/FLU/RSV plus assay is intended as an aid in the diagnosis of influenza from Nasopharyngeal swab specimens and should not be used as a sole basis for treatment. Nasal washings and aspirates are unacceptable for Xpert Xpress SARS-CoV-2/FLU/RSV testing.  Fact Sheet for Patients: EntrepreneurPulse.com.au  Fact Sheet for Healthcare Providers: IncredibleEmployment.be  This test is not yet approved or cleared by the [place] FDA and has been authorized for detection  and/or diagnosis of SARS-CoV-2 by FDA under an Emergency Use Authorization (EUA). This EUA will remain in effect (meaning this test can be used) for the duration of the COVID-19 declaration under Section 564(b)(1) of the Act, 21 U.S.C. section 360bbb-3(b)(1), unless the authorization is terminated or revoked.  Performed at [facility], [place] [address] [place], [place] [address]   Culture, blood (routine x 2)     Status: None (Preliminary result)   Collection Time: 03/05/21  8:50 PM   Specimen: Right Antecubital; Blood  Result Value Ref Range Status   Specimen Description   Final    RIGHT ANTECUBITAL Performed at [place] [address], [place], [place] [address]    Special Requests   Final  BOTTLES DRAWN AEROBIC ONLY Blood Culture adequate volume Performed at [place] [address], [place], [place] [address]    Culture   Final    NO GROWTH < 24 HOURS Performed at [place] [address], [place], [place] [address]    Report Status PENDING  Incomplete  Culture, blood (routine x 2)     Status: None (Preliminary result)   Collection Time: 03/05/21  8:50 PM   Specimen: BLOOD RIGHT ARM  Result Value Ref Range Status   Specimen Description   Final    BLOOD RIGHT ARM Performed at [place] [address], [place], [place] [address]    Special Requests   Final    BOTTLES DRAWN AEROBIC ONLY Blood Culture adequate volume Performed at [place] [address] [place], [place] [address]    Culture   Final    NO GROWTH < 24 HOURS Performed at [facility] [address] [place], [place] [address]    Report Status PENDING  Incomplete     Labs: BNP (last 3 results) Recent Labs    09/29/20 1833  BNP 229.7*   Basic Metabolic Panel: Recent Labs  Lab 03/04/21 1327 03/05/21 1526 03/06/21 0534  NA 142 138 138  K 4.3 4.4 3.7  CL 101 103 104   CO2 30 28 29   GLUCOSE 71 303* 184*  BUN 26* 22 15  CREATININE 1.11* 1.02* 0.83  CALCIUM 9.6 8.8* 8.2*   Liver Function Tests: Recent Labs  Lab 03/04/21 1327 03/05/21 1526  AST 17 17  ALT 17 17  ALKPHOS 95 86  BILITOT 0.4 0.3  PROT 8.0 7.1  ALBUMIN 3.3* 2.9*   No results for input(s): LIPASE, AMYLASE in the last 168 hours. No results for input(s): AMMONIA in the last 168 hours. CBC: Recent Labs  Lab 03/04/21 1327 03/05/21 1526 03/06/21 0534  WBC 10.3 9.2 8.5  NEUTROABS 7.3 6.8  --   HGB 12.2 11.4* 11.3*  HCT 38.0 36.4 34.8*  MCV 88.8 89.9 87.9  PLT 465* 409* 390   Cardiac Enzymes: No results for input(s): CKTOTAL, CKMB, CKMBINDEX, TROPONINI in the last 168 hours. BNP: Invalid input(s): POCBNP CBG: Recent Labs  Lab 03/06/21 1121 03/06/21 1651 03/06/21 2139 03/07/21 0358 03/07/21 0802  GLUCAP 276* 232* 430* 141* 220*   D-Dimer No results for input(s): DDIMER in the last 72 hours. Hgb A1c No results for input(s): HGBA1C in the last 72 hours. Lipid Profile No results for input(s): CHOL, HDL, LDLCALC, TRIG, CHOLHDL, LDLDIRECT in the last 72 hours. Thyroid function studies No results for input(s): TSH, T4TOTAL, T3FREE, THYROIDAB in the last 72 hours.  Invalid input(s): FREET3 Anemia work up No results for input(s): VITAMINB12, FOLATE, FERRITIN, TIBC, IRON, RETICCTPCT in the last 72 hours. Urinalysis    Component Value Date/Time   COLORURINE STRAW (A) 03/06/2021 0000   APPEARANCEUR HAZY (A) 03/06/2021 0000   LABSPEC 1.010 03/06/2021 0000   PHURINE 7.0 03/06/2021 0000   GLUCOSEU 150 (A) 03/06/2021 0000   HGBUR NEGATIVE 03/06/2021 0000   BILIRUBINUR NEGATIVE 03/06/2021 0000   KETONESUR 5 (A) 03/06/2021 0000   PROTEINUR 100 (A) 03/06/2021 0000   NITRITE NEGATIVE 03/06/2021 0000   LEUKOCYTESUR MODERATE (A) 03/06/2021 0000   Sepsis Labs Invalid input(s): PROCALCITONIN,  WBC,  LACTICIDVEN Microbiology Recent Results (from the past 240 hour(s))  Resp  Panel by RT-PCR (Flu A&B, Covid) Nasopharyngeal Swab     Status:  None   Collection Time: 03/04/21  1:27 PM   Specimen: Nasopharyngeal Swab; Nasopharyngeal(NP) swabs in vial transport medium  Result Value Ref Range Status   SARS Coronavirus 2 by RT PCR NEGATIVE NEGATIVE Final    Comment: (NOTE) SARS-CoV-2 target nucleic acids are NOT DETECTED.  The SARS-CoV-2 RNA is generally detectable in upper respiratory specimens during the acute phase of infection. The lowest concentration of SARS-CoV-2 viral copies this assay can detect is 138 copies/mL. A negative result does not preclude SARS-Cov-2 infection and should not be used as the sole basis for treatment or other patient management decisions. A negative result may occur with  improper specimen collection/handling, submission of specimen other than nasopharyngeal swab, presence of viral mutation(s) within the areas targeted by this assay, and inadequate number of viral copies(<138 copies/mL). A negative result must be combined with clinical observations, patient history, and epidemiological information. The expected result is Negative.  Fact Sheet for Patients:  EntrepreneurPulse.com.au  Fact Sheet for Healthcare Providers:  IncredibleEmployment.be  This test is no t yet approved or cleared by the [place] FDA and  has been authorized for detection and/or diagnosis of SARS-CoV-2 by FDA under an Emergency Use Authorization (EUA). This EUA will remain  in effect (meaning this test can be used) for the duration of the COVID-19 declaration under Section 564(b)(1) of the Act, 21 U.S.C.section 360bbb-3(b)(1), unless the authorization is terminated  or revoked sooner.       Influenza A by PCR NEGATIVE NEGATIVE Final   Influenza B by PCR NEGATIVE NEGATIVE Final    Comment: (NOTE) The Xpert Xpress SARS-CoV-2/FLU/RSV plus assay is intended as an aid in the diagnosis of influenza from Nasopharyngeal  swab specimens and should not be used as a sole basis for treatment. Nasal washings and aspirates are unacceptable for Xpert Xpress SARS-CoV-2/FLU/RSV testing.  Fact Sheet for Patients: EntrepreneurPulse.com.au  Fact Sheet for Healthcare Providers: IncredibleEmployment.be  This test is not yet approved or cleared by the [place] FDA and has been authorized for detection and/or diagnosis of SARS-CoV-2 by FDA under an Emergency Use Authorization (EUA). This EUA will remain in effect (meaning this test can be used) for the duration of the COVID-19 declaration under Section 564(b)(1) of the Act, 21 U.S.C. section 360bbb-3(b)(1), unless the authorization is terminated or revoked.  Performed at [facility], [place] [address], [place], [place] [address]   Urine Culture     Status: Abnormal   Collection Time: 03/04/21  6:29 PM   Specimen: Urine, Clean Catch  Result Value Ref Range Status   Specimen Description   Final    URINE, CLEAN CATCH Performed at [facility], [place] [address], [place], [place] [address]    Special Requests   Final    NONE Performed at [facility], [place] [address], [place], [place] [address]    Culture 40,000 COLONIES/mL PROTEUS MIRABILIS (A)  Final   Report Status 03/07/2021 FINAL  Final   Organism ID, Bacteria PROTEUS MIRABILIS (A)  Final      Susceptibility   Proteus mirabilis - MIC*    AMPICILLIN <=2 SENSITIVE Sensitive     CEFAZOLIN <=4 SENSITIVE Sensitive     CEFEPIME <=0.12 SENSITIVE Sensitive     CEFTRIAXONE <=0.25 SENSITIVE Sensitive     CIPROFLOXACIN <=0.25 SENSITIVE Sensitive     GENTAMICIN <=1 SENSITIVE Sensitive     IMIPENEM 2 SENSITIVE Sensitive     NITROFURANTOIN 128 RESISTANT Resistant     TRIMETH/SULFA <=  20 SENSITIVE Sensitive     AMPICILLIN/SULBACTAM <=2 SENSITIVE Sensitive     PIP/TAZO <=4 SENSITIVE Sensitive     * 40,000 COLONIES/mL PROTEUS  MIRABILIS  Resp Panel by RT-PCR (Flu A&B, Covid) Nasopharyngeal Swab     Status: None   Collection Time: 03/05/21  3:26 PM   Specimen: Nasopharyngeal Swab; Nasopharyngeal(NP) swabs in vial transport medium  Result Value Ref Range Status   SARS Coronavirus 2 by RT PCR NEGATIVE NEGATIVE Final    Comment: (NOTE) SARS-CoV-2 target nucleic acids are NOT DETECTED.  The SARS-CoV-2 RNA is generally detectable in upper respiratory specimens during the acute phase of infection. The lowest concentration of SARS-CoV-2 viral copies this assay can detect is 138 copies/mL. A negative result does not preclude SARS-Cov-2 infection and should not be used as the sole basis for treatment or other patient management decisions. A negative result may occur with  improper specimen collection/handling, submission of specimen other than nasopharyngeal swab, presence of viral mutation(s) within the areas targeted by this assay, and inadequate number of viral copies(<138 copies/mL). A negative result must be combined with clinical observations, patient history, and epidemiological information. The expected result is Negative.  Fact Sheet for Patients:  EntrepreneurPulse.com.au  Fact Sheet for Healthcare Providers:  IncredibleEmployment.be  This test is no t yet approved or cleared by the [place] FDA and  has been authorized for detection and/or diagnosis of SARS-CoV-2 by FDA under an Emergency Use Authorization (EUA). This EUA will remain  in effect (meaning this test can be used) for the duration of the COVID-19 declaration under Section 564(b)(1) of the Act, 21 U.S.C.section 360bbb-3(b)(1), unless the authorization is terminated  or revoked sooner.       Influenza A by PCR NEGATIVE NEGATIVE Final   Influenza B by PCR NEGATIVE NEGATIVE Final    Comment: (NOTE) The Xpert Xpress SARS-CoV-2/FLU/RSV plus assay is intended as an aid in the diagnosis of influenza  from Nasopharyngeal swab specimens and should not be used as a sole basis for treatment. Nasal washings and aspirates are unacceptable for Xpert Xpress SARS-CoV-2/FLU/RSV testing.  Fact Sheet for Patients: EntrepreneurPulse.com.au  Fact Sheet for Healthcare Providers: IncredibleEmployment.be  This test is not yet approved or cleared by the [place] FDA and has been authorized for detection and/or diagnosis of SARS-CoV-2 by FDA under an Emergency Use Authorization (EUA). This EUA will remain in effect (meaning this test can be used) for the duration of the COVID-19 declaration under Section 564(b)(1) of the Act, 21 U.S.C. section 360bbb-3(b)(1), unless the authorization is terminated or revoked.  Performed at [facility], [place] [address] [place], [place] [address]   Culture, blood (routine x 2)     Status: None (Preliminary result)   Collection Time: 03/05/21  8:50 PM   Specimen: Right Antecubital; Blood  Result Value Ref Range Status   Specimen Description   Final    RIGHT ANTECUBITAL Performed at [facility] [address], [place], [place] [address]    Special Requests   Final    BOTTLES DRAWN AEROBIC ONLY Blood Culture adequate volume Performed at [place] [address], [place], [place] [address]    Culture   Final    NO GROWTH < 24 HOURS Performed at [facility] [address] [place], [place] [address]    Report Status PENDING  Incomplete  Culture, blood (routine x 2)     Status: None (Preliminary result)   Collection Time:  03/05/21  8:50 PM   Specimen: BLOOD RIGHT ARM  Result Value Ref Range Status   Specimen Description   Final    BLOOD RIGHT ARM Performed at [facility] [address] [place], [place] [address]    Special Requests   Final    BOTTLES DRAWN AEROBIC ONLY Blood Culture adequate volume Performed at [facility] [address], [place], [place] [address]    Culture   Final    NO GROWTH < 24 HOURS Performed at [facility] [address], [place], [place] [address]    Report Status PENDING  Incomplete    Please note: You were cared for by a hospitalist during your hospital stay. Once you are discharged, your primary care physician will handle any further medical issues. Please note that NO REFILLS for any discharge medications will be authorized once you are discharged, as it is imperative that you return to your primary care physician (or establish a relationship with a primary care physician if you do not have one) for your post hospital discharge needs so that they can reassess your need for medications and monitor your lab values.    Time coordinating discharge: 40 minutes  SIGNED:   Shelly Coss, MD  [facility] 03/07/2021, 10:16 AM Pager [phone]  If 7PM-7AM, please contact night-coverage [email or web address] Password [email or web address]

## 2021-03-07 NOTE — TOC Transition Note (Addendum)
Transition of Care [facility]) - CM/SW Discharge Note   Patient Details  Name: Wanda Jones MRN: [id number] Date of Birth: [birth year]  Transition of Care [facility]) CM/SW Contact:  Ross Ludwig, LCSW Phone Number: 03/07/2021, 11:57 AM   Clinical Narrative:     Patient will be going home to [facility], with home health PT through [facility].  CSW signing off please reconsult with any other social work needs, home health agency has been notified of planned discharge.  DC summary and [place] orders to faxed to [facility] at [place], [phone].  Per patient, her son will be able to pick her up and bring her home today.    Final next level of care: [place] Barriers to Discharge: Barriers Resolved   Patient Goals and CMS Choice Patient states their goals for this hospitalization and ongoing recovery are:: To return back to [facility]. CMS Medicare.gov Compare Post Acute Care list provided to:: Patient Choice offered to / list presented to : Patient  Discharge Placement  [facility].                     Discharge Plan and Services                          HH Arranged: PT [place] Agency: Other - See comment Secondary school teacher) Date [place]: 03/07/21 Time [place]: 1157 Representative spoke with at [place]: [place] ([place]) Interventions     Readmission Risk Interventions Readmission Risk Prevention Plan 01/04/2021  Post Dischage Appt Complete  Medication Screening Complete  Transportation Screening Complete

## 2021-03-07 NOTE — Progress Notes (Signed)
Son states he doesn't think his mother is ready to go home. States she looks unsteady. Texted the doctor and explained and texted Physical Therapy. PT states she was ready to be discharged and did well ambulating 03-06-21. I ambulated the patient up and down the hall, the patient used a front wheel walker and did great. She stated she had a 4 wheelwalker at home that she used. Discharge instructions gone over with the son and [place]. Prescription needed to be picked up. Patient was discharged via wheel chair.

## 2021-03-07 NOTE — Evaluation (Signed)
Occupational Therapy Evaluation Patient Details Name: Wanda Jones MRN: [id number] DOB: 1946-01-12 Today's Date: 03/07/2021   History of Present Illness 75 yo female admitted with UTI, weaknesss. Hx of dementia, breast ca, CAD, DM.   Clinical Impression   [name] is a 75 year old woman who presents with impaired balance and decreased activity tolerance. Overall patient is able to perform ADLs with set up, supervision and ambulation with min guard as she does report some dizziness after ambulation.  She had no overt loss of balance during evaluation and strength appeared functional. Do not expect she will need OT services at discharge but will follow acutely so that she maintains her functional abilities. Will defer to PT services for balance recommendations.      Recommendations for follow up therapy are one component of a multi-disciplinary discharge planning process, led by the attending physician.  Recommendations may be updated based on patient status, additional functional criteria and insurance authorization.   Follow Up Recommendations  No OT follow up    Assistance Recommended at Discharge Intermittent Supervision/Assistance  Functional Status Assessment  Patient has had a recent decline in their functional status and demonstrates the ability to make significant improvements in function in a reasonable and predictable amount of time.  Equipment Recommendations  None recommended by OT    Recommendations for Other Services       Precautions / Restrictions Precautions Precautions: Fall Restrictions Weight Bearing Restrictions: No      Mobility Bed Mobility Overal bed mobility: Needs Assistance Bed Mobility: Supine to Sit     Supine to sit: Min assist     General bed mobility comments: hand hold to pull up on - patient reports using bed rail at home.    Transfers Overall transfer level: Needs assistance Equipment used: Rolling walker (2  wheels) Transfers: Sit to/from Stand Sit to Stand: Min guard           General transfer comment: min guard for ambulation to batrhoom and then to recliner. Reported dizziness on toilet but had no overt loss of balance.      Balance Overall balance assessment: Mild deficits observed, not formally tested                                         ADL either performed or assessed with clinical judgement   ADL Overall ADL's : Needs assistance/impaired Eating/Feeding: Independent   Grooming: Set up;Sitting   Upper Body Bathing: Set up;Sitting   Lower Body Bathing: Min guard;Sit to/from stand   Upper Body Dressing : Set up;Sitting   Lower Body Dressing: Min guard;Sit to/from stand   Toilet Transfer: Min guard;Rolling walker (2 wheels);Regular Toilet;Ambulation   Toileting- Clothing Manipulation and Hygiene: Sit to/from stand;Supervision/safety       Functional mobility during ADLs: Min guard;Rolling walker (2 wheels) General ADL Comments: Reports some diziness sitting on toilet.     Vision Patient Visual Report: No change from baseline       Perception     Praxis      Pertinent Vitals/Pain Pain Assessment: No/denies pain     Hand Dominance Right   Extremity/Trunk Assessment Upper Extremity Assessment Upper Extremity Assessment: Overall WFL for tasks assessed   Lower Extremity Assessment Lower Extremity Assessment: Defer to PT evaluation   Cervical / Trunk Assessment Cervical / Trunk Assessment: Normal   Communication Communication Communication: No difficulties  Cognition Arousal/Alertness: Awake/alert Behavior During Therapy: WFL for tasks assessed/performed Overall Cognitive Status: History of cognitive impairments - at baseline                                 General Comments: Is alert to self, place, month and year.     General Comments       Exercises     Shoulder Instructions      Home Living  Family/patient expects to be discharged to:: Private residence     Type of Home: Independent living facility Home Access: Level entry     Home Layout: One level     Bathroom Shower/Tub: Occupational psychologist: Handicapped height     Home Equipment: Rollator (4 wheels);Grab bars - tub/shower   Additional Comments: pt has walk-in shower with seat, grab bars, hand held shower.      Prior Functioning/Environment               Mobility Comments: uses rollator for ambulation. walks to dining room ADLs Comments: independent        OT Problem List: Impaired balance (sitting and/or standing);Decreased activity tolerance;Decreased cognition;Decreased safety awareness      OT Treatment/Interventions: Self-care/ADL training;DME and/or AE instruction;Therapeutic activities;Patient/family education;Balance training    OT Goals(Current goals can be found in the care plan section) Acute Rehab OT Goals OT Goal Formulation: Patient unable to participate in goal setting Time For Goal Achievement: 03/21/21 Potential to Achieve Goals: Good  OT Frequency: Min 2X/week   Barriers to D/C:            Co-evaluation              AM-PAC OT "6 Clicks" Daily Activity     Outcome Measure Help from another person eating meals?: None Help from another person taking care of personal grooming?: A Little Help from another person toileting, which includes using toliet, bedpan, or urinal?: A Little Help from another person bathing (including washing, rinsing, drying)?: A Little Help from another person to put on and taking off regular upper body clothing?: A Little Help from another person to put on and taking off regular lower body clothing?: A Little 6 Click Score: 19   End of Session Equipment Utilized During Treatment: Rolling walker (2 wheels);Gait belt Nurse Communication: Mobility status  Activity Tolerance: Patient tolerated treatment well Patient left: in chair;with  call bell/phone within reach;with chair alarm set  OT Visit Diagnosis: Unsteadiness on feet (R26.81);Other symptoms and signs involving cognitive function                Time: 0927-0959 OT Time Calculation (min): 32 min Charges:  OT General Charges $OT Visit: 1 Visit OT Evaluation $OT Eval Low Complexity: 1 Low OT Treatments $Self Care/Home Management : 8-22 mins  [name], OTR/L [place] [phone] Pager: [place] 03/07/2021, 11:01 AM

## 2021-03-08 DIAGNOSIS — E109 Type 1 diabetes mellitus without complications: Secondary | ICD-10-CM | POA: Diagnosis not present

## 2021-03-08 DIAGNOSIS — E039 Hypothyroidism, unspecified: Secondary | ICD-10-CM | POA: Diagnosis not present

## 2021-03-08 DIAGNOSIS — I2510 Atherosclerotic heart disease of native coronary artery without angina pectoris: Secondary | ICD-10-CM | POA: Diagnosis not present

## 2021-03-08 DIAGNOSIS — Z20822 Contact with and (suspected) exposure to covid-19: Secondary | ICD-10-CM | POA: Diagnosis not present

## 2021-03-08 DIAGNOSIS — F0280 Dementia in other diseases classified elsewhere without behavioral disturbance: Secondary | ICD-10-CM | POA: Diagnosis not present

## 2021-03-08 DIAGNOSIS — L22 Diaper dermatitis: Secondary | ICD-10-CM | POA: Diagnosis not present

## 2021-03-08 DIAGNOSIS — I10 Essential (primary) hypertension: Secondary | ICD-10-CM | POA: Diagnosis not present

## 2021-03-08 DIAGNOSIS — Z7982 Long term (current) use of aspirin: Secondary | ICD-10-CM | POA: Diagnosis not present

## 2021-03-08 DIAGNOSIS — M810 Age-related osteoporosis without current pathological fracture: Secondary | ICD-10-CM | POA: Diagnosis not present

## 2021-03-08 DIAGNOSIS — Z4800 Encounter for change or removal of nonsurgical wound dressing: Secondary | ICD-10-CM | POA: Diagnosis not present

## 2021-03-09 DIAGNOSIS — M810 Age-related osteoporosis without current pathological fracture: Secondary | ICD-10-CM | POA: Diagnosis not present

## 2021-03-09 DIAGNOSIS — E039 Hypothyroidism, unspecified: Secondary | ICD-10-CM | POA: Diagnosis not present

## 2021-03-09 DIAGNOSIS — E1165 Type 2 diabetes mellitus with hyperglycemia: Secondary | ICD-10-CM | POA: Diagnosis not present

## 2021-03-09 DIAGNOSIS — N390 Urinary tract infection, site not specified: Secondary | ICD-10-CM | POA: Diagnosis not present

## 2021-03-09 DIAGNOSIS — F0280 Dementia in other diseases classified elsewhere without behavioral disturbance: Secondary | ICD-10-CM | POA: Diagnosis not present

## 2021-03-09 DIAGNOSIS — I2510 Atherosclerotic heart disease of native coronary artery without angina pectoris: Secondary | ICD-10-CM | POA: Diagnosis not present

## 2021-03-09 DIAGNOSIS — I10 Essential (primary) hypertension: Secondary | ICD-10-CM | POA: Diagnosis not present

## 2021-03-09 DIAGNOSIS — E785 Hyperlipidemia, unspecified: Secondary | ICD-10-CM | POA: Diagnosis not present

## 2021-03-09 DIAGNOSIS — E860 Dehydration: Secondary | ICD-10-CM | POA: Diagnosis not present

## 2021-03-10 DIAGNOSIS — E039 Hypothyroidism, unspecified: Secondary | ICD-10-CM | POA: Diagnosis not present

## 2021-03-10 DIAGNOSIS — E860 Dehydration: Secondary | ICD-10-CM | POA: Diagnosis not present

## 2021-03-10 DIAGNOSIS — M810 Age-related osteoporosis without current pathological fracture: Secondary | ICD-10-CM | POA: Diagnosis not present

## 2021-03-10 DIAGNOSIS — N390 Urinary tract infection, site not specified: Secondary | ICD-10-CM | POA: Diagnosis not present

## 2021-03-10 DIAGNOSIS — E1165 Type 2 diabetes mellitus with hyperglycemia: Secondary | ICD-10-CM | POA: Diagnosis not present

## 2021-03-10 DIAGNOSIS — F0280 Dementia in other diseases classified elsewhere without behavioral disturbance: Secondary | ICD-10-CM | POA: Diagnosis not present

## 2021-03-10 DIAGNOSIS — E785 Hyperlipidemia, unspecified: Secondary | ICD-10-CM | POA: Diagnosis not present

## 2021-03-10 DIAGNOSIS — I10 Essential (primary) hypertension: Secondary | ICD-10-CM | POA: Diagnosis not present

## 2021-03-10 DIAGNOSIS — I2510 Atherosclerotic heart disease of native coronary artery without angina pectoris: Secondary | ICD-10-CM | POA: Diagnosis not present

## 2021-03-10 LAB — CULTURE, BLOOD (ROUTINE X 2)
Culture: NO GROWTH
Culture: NO GROWTH
Special Requests: ADEQUATE
Special Requests: ADEQUATE

## 2021-03-15 DIAGNOSIS — N390 Urinary tract infection, site not specified: Secondary | ICD-10-CM | POA: Diagnosis not present

## 2021-03-15 DIAGNOSIS — M810 Age-related osteoporosis without current pathological fracture: Secondary | ICD-10-CM | POA: Diagnosis not present

## 2021-03-15 DIAGNOSIS — I10 Essential (primary) hypertension: Secondary | ICD-10-CM | POA: Diagnosis not present

## 2021-03-15 DIAGNOSIS — I2510 Atherosclerotic heart disease of native coronary artery without angina pectoris: Secondary | ICD-10-CM | POA: Diagnosis not present

## 2021-03-15 DIAGNOSIS — Z20828 Contact with and (suspected) exposure to other viral communicable diseases: Secondary | ICD-10-CM | POA: Diagnosis not present

## 2021-03-15 DIAGNOSIS — Z1159 Encounter for screening for other viral diseases: Secondary | ICD-10-CM | POA: Diagnosis not present

## 2021-03-15 DIAGNOSIS — F0280 Dementia in other diseases classified elsewhere without behavioral disturbance: Secondary | ICD-10-CM | POA: Diagnosis not present

## 2021-03-15 DIAGNOSIS — E039 Hypothyroidism, unspecified: Secondary | ICD-10-CM | POA: Diagnosis not present

## 2021-03-15 DIAGNOSIS — E1165 Type 2 diabetes mellitus with hyperglycemia: Secondary | ICD-10-CM | POA: Diagnosis not present

## 2021-03-15 DIAGNOSIS — E860 Dehydration: Secondary | ICD-10-CM | POA: Diagnosis not present

## 2021-03-15 DIAGNOSIS — E785 Hyperlipidemia, unspecified: Secondary | ICD-10-CM | POA: Diagnosis not present

## 2021-03-17 ENCOUNTER — Other Ambulatory Visit: Payer: Self-pay

## 2021-03-17 ENCOUNTER — Telehealth: Payer: Self-pay

## 2021-03-17 DIAGNOSIS — E039 Hypothyroidism, unspecified: Secondary | ICD-10-CM | POA: Diagnosis not present

## 2021-03-17 DIAGNOSIS — E785 Hyperlipidemia, unspecified: Secondary | ICD-10-CM | POA: Diagnosis not present

## 2021-03-17 DIAGNOSIS — I2510 Atherosclerotic heart disease of native coronary artery without angina pectoris: Secondary | ICD-10-CM | POA: Diagnosis not present

## 2021-03-17 DIAGNOSIS — N390 Urinary tract infection, site not specified: Secondary | ICD-10-CM | POA: Diagnosis not present

## 2021-03-17 DIAGNOSIS — E860 Dehydration: Secondary | ICD-10-CM | POA: Diagnosis not present

## 2021-03-17 DIAGNOSIS — E1165 Type 2 diabetes mellitus with hyperglycemia: Secondary | ICD-10-CM | POA: Diagnosis not present

## 2021-03-17 DIAGNOSIS — I10 Essential (primary) hypertension: Secondary | ICD-10-CM | POA: Diagnosis not present

## 2021-03-17 DIAGNOSIS — M810 Age-related osteoporosis without current pathological fracture: Secondary | ICD-10-CM | POA: Diagnosis not present

## 2021-03-17 DIAGNOSIS — F0280 Dementia in other diseases classified elsewhere without behavioral disturbance: Secondary | ICD-10-CM | POA: Diagnosis not present

## 2021-03-17 MED ORDER — FREESTYLE LIBRE 2 READER DEVI: 0 refills | Status: DC

## 2021-03-17 NOTE — Telephone Encounter (Signed)
Patient need a new Freestyle Libre Reader  sent to pharmacy. Per Pharmacist she dropped the old one in H2O and it is not working. Please send to [facility] on [address]. They currently have it in stock.   Thank you

## 2021-03-17 NOTE — Telephone Encounter (Signed)
Script sent for [employer] 2 reader

## 2021-03-17 NOTE — Telephone Encounter (Signed)
Tried to contact pt to see which freestyle device they are using whether it is the regular freestyle or is it the freestyle Libre 2. Phone just kept ringing.

## 2021-03-18 DIAGNOSIS — N390 Urinary tract infection, site not specified: Secondary | ICD-10-CM | POA: Diagnosis not present

## 2021-03-18 DIAGNOSIS — E860 Dehydration: Secondary | ICD-10-CM | POA: Diagnosis not present

## 2021-03-18 DIAGNOSIS — M810 Age-related osteoporosis without current pathological fracture: Secondary | ICD-10-CM | POA: Diagnosis not present

## 2021-03-18 DIAGNOSIS — E1165 Type 2 diabetes mellitus with hyperglycemia: Secondary | ICD-10-CM | POA: Diagnosis not present

## 2021-03-18 DIAGNOSIS — I2510 Atherosclerotic heart disease of native coronary artery without angina pectoris: Secondary | ICD-10-CM | POA: Diagnosis not present

## 2021-03-18 DIAGNOSIS — E785 Hyperlipidemia, unspecified: Secondary | ICD-10-CM | POA: Diagnosis not present

## 2021-03-18 DIAGNOSIS — I10 Essential (primary) hypertension: Secondary | ICD-10-CM | POA: Diagnosis not present

## 2021-03-18 DIAGNOSIS — F0280 Dementia in other diseases classified elsewhere without behavioral disturbance: Secondary | ICD-10-CM | POA: Diagnosis not present

## 2021-03-18 DIAGNOSIS — E039 Hypothyroidism, unspecified: Secondary | ICD-10-CM | POA: Diagnosis not present

## 2021-03-22 DIAGNOSIS — E1165 Type 2 diabetes mellitus with hyperglycemia: Secondary | ICD-10-CM | POA: Diagnosis not present

## 2021-03-22 DIAGNOSIS — I10 Essential (primary) hypertension: Secondary | ICD-10-CM | POA: Diagnosis not present

## 2021-03-22 DIAGNOSIS — E860 Dehydration: Secondary | ICD-10-CM | POA: Diagnosis not present

## 2021-03-22 DIAGNOSIS — Z20828 Contact with and (suspected) exposure to other viral communicable diseases: Secondary | ICD-10-CM | POA: Diagnosis not present

## 2021-03-22 DIAGNOSIS — F0280 Dementia in other diseases classified elsewhere without behavioral disturbance: Secondary | ICD-10-CM | POA: Diagnosis not present

## 2021-03-22 DIAGNOSIS — M810 Age-related osteoporosis without current pathological fracture: Secondary | ICD-10-CM | POA: Diagnosis not present

## 2021-03-22 DIAGNOSIS — Z1159 Encounter for screening for other viral diseases: Secondary | ICD-10-CM | POA: Diagnosis not present

## 2021-03-22 DIAGNOSIS — N390 Urinary tract infection, site not specified: Secondary | ICD-10-CM | POA: Diagnosis not present

## 2021-03-22 DIAGNOSIS — E785 Hyperlipidemia, unspecified: Secondary | ICD-10-CM | POA: Diagnosis not present

## 2021-03-22 DIAGNOSIS — I2510 Atherosclerotic heart disease of native coronary artery without angina pectoris: Secondary | ICD-10-CM | POA: Diagnosis not present

## 2021-03-22 DIAGNOSIS — E039 Hypothyroidism, unspecified: Secondary | ICD-10-CM | POA: Diagnosis not present

## 2021-03-24 ENCOUNTER — Non-Acute Institutional Stay: Payer: Medicare HMO | Admitting: Hospice

## 2021-03-24 ENCOUNTER — Other Ambulatory Visit: Payer: Self-pay

## 2021-03-24 DIAGNOSIS — Z794 Long term (current) use of insulin: Secondary | ICD-10-CM

## 2021-03-24 DIAGNOSIS — N390 Urinary tract infection, site not specified: Secondary | ICD-10-CM | POA: Diagnosis not present

## 2021-03-24 DIAGNOSIS — M810 Age-related osteoporosis without current pathological fracture: Secondary | ICD-10-CM | POA: Diagnosis not present

## 2021-03-24 DIAGNOSIS — Z515 Encounter for palliative care: Secondary | ICD-10-CM | POA: Diagnosis not present

## 2021-03-24 DIAGNOSIS — E039 Hypothyroidism, unspecified: Secondary | ICD-10-CM | POA: Diagnosis not present

## 2021-03-24 DIAGNOSIS — E119 Type 2 diabetes mellitus without complications: Secondary | ICD-10-CM

## 2021-03-24 DIAGNOSIS — F0390 Unspecified dementia without behavioral disturbance: Secondary | ICD-10-CM

## 2021-03-24 DIAGNOSIS — F0280 Dementia in other diseases classified elsewhere without behavioral disturbance: Secondary | ICD-10-CM | POA: Diagnosis not present

## 2021-03-24 DIAGNOSIS — R531 Weakness: Secondary | ICD-10-CM

## 2021-03-24 DIAGNOSIS — E785 Hyperlipidemia, unspecified: Secondary | ICD-10-CM | POA: Diagnosis not present

## 2021-03-24 DIAGNOSIS — I10 Essential (primary) hypertension: Secondary | ICD-10-CM | POA: Diagnosis not present

## 2021-03-24 DIAGNOSIS — E1165 Type 2 diabetes mellitus with hyperglycemia: Secondary | ICD-10-CM | POA: Diagnosis not present

## 2021-03-24 DIAGNOSIS — I2510 Atherosclerotic heart disease of native coronary artery without angina pectoris: Secondary | ICD-10-CM | POA: Diagnosis not present

## 2021-03-24 DIAGNOSIS — E860 Dehydration: Secondary | ICD-10-CM | POA: Diagnosis not present

## 2021-03-24 NOTE — Progress Notes (Signed)
[facility] Palliative Care Consult Note Telephone: [phone]  Fax: [phone]  PATIENT NAME: Wanda Jones [address] [place] [address] [phone] (home)  DOB: 1945-10-12 MRN: [id number]  PRIMARY CARE PROVIDER:    Roetta Sessions, NP [address] [place],  [place] [address]  REFERRING PROVIDER:   Roetta Sessions, NP [address] [place],  [place] [address] [phone]  RESPONSIBLE PARTY:   Self Retired after 8 years from the [place] in [employer]. Contact Information     Name Relation Home Work Mobile   More,Chris Son   [phone]        I met face to face with patient and family at home. Palliative Care was asked to follow this patient by consultation request of  Wanda Jones* to address advance care planning, complex medical decision making and goals of care clarification. Wanda Glad [employer] NP shadowing.; Wanda Jones joined the visit.  This is the initial visit.    ASSESSMENT AND / RECOMMENDATIONS:   Advance Care Planning: Our advance care planning conversation included a discussion about:    The value and importance of advance care planning  Difference between Hospice and Palliative care Exploration of goals of care in the event of a sudden injury or illness  Identification and preparation of a healthcare agent  Review and updating or creation of an  advance directive document .  CODE STATUS:Patient affirmed she is a Full Code  Goals of Care: Goals include to maximize quality of life and symptom management  I spent 25 minutes providing this initial consultation. More than 50% of the time in this consultation was spent on counseling patient and coordinating communication. --------------------------------------------------------------------------------------------------------------------------------------  Symptom Management/Plan: Dementia: Managed with  Donepezil, Memantin. Encouarge participation in activities, reminiscence, word search/puzzles.  Type 2 DM: Blood sugar monitored with Freestyle Libre 2, checked during visit - 101. Managed with Novolog and [place]. Med Tech from [place] check her blood sugar and ensure she takes her insulin. Last A1c last month is 9.2. Follow up with Endocrinologist next week Tuesday per Wanda Jones. Plan also to see Urologist in Jan 2023  Weakness: PT/OT is ongoing for strengthening.  Routine A1c, CBC BMP Follow up: Palliative care will continue to follow for complex medical decision making, advance care planning, and clarification of goals. Return 6 weeks or prn.Encouraged to call provider sooner with any concerns.   Family /Caregiver/Community Supports: Patient lives at home; help from [place] services, and son lives close by and comes every day to help. Strong family support system identified.   HOSPICE ELIGIBILITY/DIAGNOSIS: TBD  Chief Complaint: Initial Palliative care visit  HISTORY OF PRESENT ILLNESS:  Wanda Jones is a 75 y.o. year old female  with multiple medical conditions including  Dementia which is progressively worsening  with associated memory loss/confusion, worsened whenever she has urinary tract infection. Memory loss impairs her quality of life. She was recently hospitalized and treated for UTI. She denies urinary symptom. She denies pain/discomfort, no respiratory distress. History of recurrent UTI, Type 2 DM, Depression moderate protein caloric malnutrition. History obtained from review of EMR, discussion with primary team, caregiver, family and/or Ms. Purohit.  Review and summarization of Epic records shows history from other than patient. Rest of 10 point ROS asked and negative.  I reviewed, as needed, available labs, patient records, imaging, studies and related documents from the EMR.   Latest Reference Range & Units Most Recent  Sodium 135 -  145 mmol/L 138 03/06/21 05:34  Potassium 3.5  - 5.1 mmol/L 3.7 03/06/21 05:34  Chloride 98 - 111 mmol/L 104 03/06/21 05:34  CO2 22 - 32 mmol/L 29 03/06/21 05:34  Glucose 70 - 99 mg/dL 184 (H) 03/06/21 05:34  Mean Plasma Glucose mg/dL 228.82 01/03/21 00:30  BUN 8 - 23 mg/dL 15 03/06/21 05:34  Creatinine 0.44 - 1.00 mg/dL 0.83 03/06/21 05:34  Calcium 8.9 - 10.3 mg/dL 8.2 (L) 03/06/21 05:34  Anion gap 5 - 15  5 03/06/21 05:34  Magnesium 1.7 - 2.4 mg/dL 1.8 01/28/21 03:35  Alkaline Phosphatase 38 - 126 U/L 86 03/05/21 15:26  Albumin 3.5 - 5.0 g/dL 2.9 (L) 03/05/21 15:26    Latest Reference Range & Units Most Recent  Hemoglobin A1C 4.0 - 5.6 % 9.2 ! 02/19/21 14:10    ROS General: NAD EYES: denies vision changes ENMT: denies dysphagia Cardiovascular: denies chest pain/discomfort Pulmonary: denies cough, denies SOB Abdomen: endorses good appetite, denies constipation/diarrhea GU: denies dysuria, urinary frequency MSK:  endorses weakness,  no falls reported Skin: denies rashes or wounds Neurological: denies pain, denies insomnia Psych: Endorses positive mood Heme/lymph/immuno: denies bruises, abnormal bleeding  Physical Exam: Constitutional: NAD General: Well groomed, cooperative EYES: anicteric sclera, lids intact, no discharge  ENMT: Moist mucous membrane CV: S1 S2, RRR, no LE edema Pulmonary: LCTA, no increased work of breathing, no cough, Abdomen: active BS + 4 quadrants, soft and non tender GU: no suprapubic tenderness MSK: weakness, ambulatory with rolling walker Skin: warm and dry, no rashes or wounds on visible skin Neuro:  weakness, otherwise non focal Psych: non-anxious affect Hem/lymph/immuno: no widespread bruising   PAST MEDICAL HISTORY:  Active Ambulatory Problems    Diagnosis Date Noted   Sepsis ([place]) 01/02/2021   UTI (urinary tract infection) 01/02/2021   Cellulitis 01/02/2021   Hyperglycemia 01/02/2021   AKI (acute kidney injury) ([place]) 01/02/2021   Diabetes mellitus ([place]) 01/02/2021    Sepsis secondary to UTI ([place]) 01/24/2021   Volume depletion 01/24/2021   Hyperlipidemia 01/24/2021   Dementia ([place]) 01/24/2021   Acute encephalopathy 01/24/2021   Mild protein malnutrition ([place]) 01/24/2021   Weakness 03/06/2021   Pressure injury of skin 03/07/2021   Resolved Ambulatory Problems    Diagnosis Date Noted   No Resolved Ambulatory Problems   Past Medical History:  Diagnosis Date   Diabetes mellitus type 1 ([place])    History of recurrent UTIs     SOCIAL HX:  Social History   Tobacco Use   Smoking status: Never   Smokeless tobacco: Not on file  Substance Use Topics   Alcohol use: Not on file     FAMILY HX:  Family History  Problem Relation Age of Onset   Hypertension Other    Diabetes Neg Hx       ALLERGIES:  Allergies  Allergen Reactions   Codeine Anaphylaxis   Prednisone Anaphylaxis      PERTINENT MEDICATIONS:  Outpatient Encounter Medications as of 03/24/2021  Medication Sig   acetaminophen (TYLENOL) 325 MG tablet Take 325 mg by mouth every 6 (six) hours as needed for moderate pain or headache.   cetirizine (ZYRTEC) 10 MG tablet Take 10 mg by mouth daily as needed for allergies.   clobetasol (TEMOVATE) 0.05 % external solution Apply 1 application topically 2 (two) times daily as needed (irritation).   Continuous Blood Gluc Receiver (FREESTYLE LIBRE 2 READER) DEVI Use as directed. E10.69   donepezil (ARICEPT) 5 MG tablet Take 5 mg by mouth at bedtime.  insulin aspart (NOVOLOG FLEXPEN) 100 UNIT/ML FlexPen 3 times a day (just before each meal), 6-5-5 units. (Patient taking differently: 5-6 Units 3 (three) times daily with meals. Take 6 units in the morning, Take 5 units in the afternoon & Take 5 units in the evening)   insulin degludec (TRESIBA FLEXTOUCH) 100 UNIT/ML FlexTouch Pen Inject 18 Units into the skin daily.   levothyroxine (SYNTHROID) 88 MCG tablet Take 88 mcg by mouth daily before breakfast.   memantine (NAMENDA) 5 MG tablet Take 5 mg by mouth  2 (two) times daily.   Multiple Vitamins-Minerals (MULTIVITAMIN ADULTS) TABS Take 1 tablet by mouth daily.   MYRBETRIQ 50 MG TB24 tablet Take 50 mg by mouth daily.   rosuvastatin (CRESTOR) 5 MG tablet Take 5 mg by mouth at bedtime.   venlafaxine XR (EFFEXOR-XR) 150 MG 24 hr capsule Take 150 mg by mouth daily with breakfast.   vitamin B-12 (CYANOCOBALAMIN) 1000 MCG tablet Take 1,000 mcg by mouth daily.   No facility-administered encounter medications on file as of 03/24/2021.     Thank you for the opportunity to participate in the care of Ms. Ventola.  The palliative care team will continue to follow. Please call our office at [phone] if we can be of additional assistance.   Note: Portions of this note were generated with L[employer]. Dictation errors may occur despite best attempts at proofreading.  Teodoro Spray, NP

## 2021-03-25 ENCOUNTER — Ambulatory Visit: Payer: Medicare HMO | Admitting: Endocrinology

## 2021-03-26 DIAGNOSIS — N390 Urinary tract infection, site not specified: Secondary | ICD-10-CM | POA: Diagnosis not present

## 2021-03-26 DIAGNOSIS — F0280 Dementia in other diseases classified elsewhere without behavioral disturbance: Secondary | ICD-10-CM | POA: Diagnosis not present

## 2021-03-26 DIAGNOSIS — I10 Essential (primary) hypertension: Secondary | ICD-10-CM | POA: Diagnosis not present

## 2021-03-26 DIAGNOSIS — E860 Dehydration: Secondary | ICD-10-CM | POA: Diagnosis not present

## 2021-03-26 DIAGNOSIS — E039 Hypothyroidism, unspecified: Secondary | ICD-10-CM | POA: Diagnosis not present

## 2021-03-26 DIAGNOSIS — M810 Age-related osteoporosis without current pathological fracture: Secondary | ICD-10-CM | POA: Diagnosis not present

## 2021-03-26 DIAGNOSIS — E1165 Type 2 diabetes mellitus with hyperglycemia: Secondary | ICD-10-CM | POA: Diagnosis not present

## 2021-03-26 DIAGNOSIS — E785 Hyperlipidemia, unspecified: Secondary | ICD-10-CM | POA: Diagnosis not present

## 2021-03-26 DIAGNOSIS — I2510 Atherosclerotic heart disease of native coronary artery without angina pectoris: Secondary | ICD-10-CM | POA: Diagnosis not present

## 2021-03-29 DIAGNOSIS — M810 Age-related osteoporosis without current pathological fracture: Secondary | ICD-10-CM | POA: Diagnosis not present

## 2021-03-29 DIAGNOSIS — F0280 Dementia in other diseases classified elsewhere without behavioral disturbance: Secondary | ICD-10-CM | POA: Diagnosis not present

## 2021-03-29 DIAGNOSIS — N390 Urinary tract infection, site not specified: Secondary | ICD-10-CM | POA: Diagnosis not present

## 2021-03-29 DIAGNOSIS — E039 Hypothyroidism, unspecified: Secondary | ICD-10-CM | POA: Diagnosis not present

## 2021-03-29 DIAGNOSIS — E1165 Type 2 diabetes mellitus with hyperglycemia: Secondary | ICD-10-CM | POA: Diagnosis not present

## 2021-03-29 DIAGNOSIS — I2510 Atherosclerotic heart disease of native coronary artery without angina pectoris: Secondary | ICD-10-CM | POA: Diagnosis not present

## 2021-03-29 DIAGNOSIS — E785 Hyperlipidemia, unspecified: Secondary | ICD-10-CM | POA: Diagnosis not present

## 2021-03-29 DIAGNOSIS — Z20828 Contact with and (suspected) exposure to other viral communicable diseases: Secondary | ICD-10-CM | POA: Diagnosis not present

## 2021-03-29 DIAGNOSIS — Z1159 Encounter for screening for other viral diseases: Secondary | ICD-10-CM | POA: Diagnosis not present

## 2021-03-29 DIAGNOSIS — I10 Essential (primary) hypertension: Secondary | ICD-10-CM | POA: Diagnosis not present

## 2021-03-29 DIAGNOSIS — E860 Dehydration: Secondary | ICD-10-CM | POA: Diagnosis not present

## 2021-03-31 DIAGNOSIS — E1165 Type 2 diabetes mellitus with hyperglycemia: Secondary | ICD-10-CM | POA: Diagnosis not present

## 2021-03-31 DIAGNOSIS — E860 Dehydration: Secondary | ICD-10-CM | POA: Diagnosis not present

## 2021-03-31 DIAGNOSIS — E785 Hyperlipidemia, unspecified: Secondary | ICD-10-CM | POA: Diagnosis not present

## 2021-03-31 DIAGNOSIS — Z1159 Encounter for screening for other viral diseases: Secondary | ICD-10-CM | POA: Diagnosis not present

## 2021-03-31 DIAGNOSIS — I2510 Atherosclerotic heart disease of native coronary artery without angina pectoris: Secondary | ICD-10-CM | POA: Diagnosis not present

## 2021-03-31 DIAGNOSIS — E039 Hypothyroidism, unspecified: Secondary | ICD-10-CM | POA: Diagnosis not present

## 2021-03-31 DIAGNOSIS — I10 Essential (primary) hypertension: Secondary | ICD-10-CM | POA: Diagnosis not present

## 2021-03-31 DIAGNOSIS — F0280 Dementia in other diseases classified elsewhere without behavioral disturbance: Secondary | ICD-10-CM | POA: Diagnosis not present

## 2021-03-31 DIAGNOSIS — N390 Urinary tract infection, site not specified: Secondary | ICD-10-CM | POA: Diagnosis not present

## 2021-03-31 DIAGNOSIS — M810 Age-related osteoporosis without current pathological fracture: Secondary | ICD-10-CM | POA: Diagnosis not present

## 2021-03-31 DIAGNOSIS — Z20828 Contact with and (suspected) exposure to other viral communicable diseases: Secondary | ICD-10-CM | POA: Diagnosis not present

## 2021-04-01 DIAGNOSIS — E785 Hyperlipidemia, unspecified: Secondary | ICD-10-CM | POA: Diagnosis not present

## 2021-04-01 DIAGNOSIS — M810 Age-related osteoporosis without current pathological fracture: Secondary | ICD-10-CM | POA: Diagnosis not present

## 2021-04-01 DIAGNOSIS — F0280 Dementia in other diseases classified elsewhere without behavioral disturbance: Secondary | ICD-10-CM | POA: Diagnosis not present

## 2021-04-01 DIAGNOSIS — E1165 Type 2 diabetes mellitus with hyperglycemia: Secondary | ICD-10-CM | POA: Diagnosis not present

## 2021-04-01 DIAGNOSIS — I10 Essential (primary) hypertension: Secondary | ICD-10-CM | POA: Diagnosis not present

## 2021-04-01 DIAGNOSIS — I2510 Atherosclerotic heart disease of native coronary artery without angina pectoris: Secondary | ICD-10-CM | POA: Diagnosis not present

## 2021-04-01 DIAGNOSIS — E860 Dehydration: Secondary | ICD-10-CM | POA: Diagnosis not present

## 2021-04-01 DIAGNOSIS — N390 Urinary tract infection, site not specified: Secondary | ICD-10-CM | POA: Diagnosis not present

## 2021-04-01 DIAGNOSIS — E039 Hypothyroidism, unspecified: Secondary | ICD-10-CM | POA: Diagnosis not present

## 2021-04-02 ENCOUNTER — Other Ambulatory Visit: Payer: Self-pay

## 2021-04-02 ENCOUNTER — Encounter: Payer: Self-pay | Admitting: Endocrinology

## 2021-04-02 ENCOUNTER — Ambulatory Visit: Payer: Medicare HMO | Admitting: Endocrinology

## 2021-04-02 VITALS — BP 112/60 | HR 108 | Ht 63.0 in | Wt 132.6 lb

## 2021-04-02 DIAGNOSIS — Z1159 Encounter for screening for other viral diseases: Secondary | ICD-10-CM | POA: Diagnosis not present

## 2021-04-02 DIAGNOSIS — E1069 Type 1 diabetes mellitus with other specified complication: Secondary | ICD-10-CM | POA: Diagnosis not present

## 2021-04-02 DIAGNOSIS — Z20828 Contact with and (suspected) exposure to other viral communicable diseases: Secondary | ICD-10-CM | POA: Diagnosis not present

## 2021-04-02 MED ORDER — TRESIBA FLEXTOUCH 100 UNIT/ML ~~LOC~~ SOPN
8.0000 [IU] | PEN_INJECTOR | Freq: Every day | SUBCUTANEOUS | Status: DC
Start: 2021-04-02 — End: 2024-01-16

## 2021-04-02 MED ORDER — OZEMPIC (0.25 OR 0.5 MG/DOSE) 2 MG/1.5ML ~~LOC~~ SOPN: 0.5000 mg | PEN_INJECTOR | SUBCUTANEOUS | 3 refills | Status: DC

## 2021-04-02 NOTE — Patient Instructions (Addendum)
check your blood sugar twice a day.  vary the time of day when you check, between before the 3 meals, and at bedtime.  also check if you have symptoms of your blood sugar being too high or too low.  please keep a record of the readings and bring it to your next appointment here (or you can bring the meter itself).  You can write it on any piece of paper.  please call us sooner if your blood sugar goes below 70, or if most of your readings are over 200.   We will need to take this complex situation in stages.   I have sent a prescription to your pharmacy, to add Ozempic, and:  For now, please reduce the Tresiba to 8 units per day, and: continue Novolog 3 times a day (just before each meal), 6-5-5 units.  Please come back for a follow-up appointment in 6 weeks.

## 2021-04-02 NOTE — Progress Notes (Signed)
Subjective:    Patient ID: Wanda Jones, female    DOB: 06-27-1945, 75 y.o.   MRN: [id number]  HPI Pt returns for f/u of diabetes mellitus: DM type: Insulin-requiring type 2 Dx'ed: 4193 Complications: DR and CAD Therapy: insulin since dx GDM: never DKA: never Severe hypoglycemia: Last episode was mid-2022.  Pancreatitis: never Pancreatic imaging: normal on 2019 CT SDOH: Husb (who is HOH) provides some hx.  Pt lives at [facility], but she takes her own insulin, and operates her own continuous glucose monitor. Other: she takes multiple daily injections Interval history: pt states she feels well in general.  Pt says she never misses the insulin.  I reviewed continuous glucose monitor data.  Glucose varies from 60-400.  It is in general highest at 12MN-4AM, and lowest at [facility].  It increases 2PM-12MN. Past Medical History:  Diagnosis Date   Dementia ([place]) 01/24/2021   Diabetes mellitus type 1 ([place])    History of recurrent UTIs    Hyperlipidemia 01/24/2021    No past surgical history on file.  Social History   Socioeconomic History   Marital status: Single    Spouse name: Not on file   Number of children: Not on file   Years of education: Not on file   Highest education level: Not on file  Occupational History   Not on file  Tobacco Use   Smoking status: Never   Smokeless tobacco: Not on file  Substance and Sexual Activity   Alcohol use: Not on file   Drug use: Not on file   Sexual activity: Not on file  Other Topics Concern   Not on file  Social History Narrative   Not on file   Social Determinants of Health   Financial Resource Strain: Not on file  Food Insecurity: Not on file  Transportation Needs: Not on file  Physical Activity: Not on file  Stress: Not on file  Social Connections: Not on file  Intimate Partner Violence: Not on file    Current Outpatient Medications on File Prior to Visit  Medication Sig Dispense Refill   acetaminophen (TYLENOL) 325 MG  tablet Take 325 mg by mouth every 6 (six) hours as needed for moderate pain or headache.     cetirizine (ZYRTEC) 10 MG tablet Take 10 mg by mouth daily as needed for allergies.     clobetasol (TEMOVATE) 0.05 % external solution Apply 1 application topically 2 (two) times daily as needed (irritation).     Continuous Blood Gluc Receiver (FREESTYLE LIBRE 2 READER) DEVI Use as directed. E10.69 1 each 0   donepezil (ARICEPT) 5 MG tablet Take 5 mg by mouth at bedtime.     insulin aspart (NOVOLOG FLEXPEN) 100 UNIT/ML FlexPen 3 times a day (just before each meal), 6-5-5 units. (Patient taking differently: 5-6 Units 3 (three) times daily with meals. Take 6 units in the morning, Take 5 units in the afternoon & Take 5 units in the evening) 15 mL 11   levothyroxine (SYNTHROID) 88 MCG tablet Take 88 mcg by mouth daily before breakfast.     memantine (NAMENDA) 5 MG tablet Take 5 mg by mouth 2 (two) times daily.     Multiple Vitamins-Minerals (MULTIVITAMIN ADULTS) TABS Take 1 tablet by mouth daily.     MYRBETRIQ 50 MG TB24 tablet Take 50 mg by mouth daily.     rosuvastatin (CRESTOR) 5 MG tablet Take 5 mg by mouth at bedtime.     venlafaxine XR (EFFEXOR-XR) 150 MG 24 hr capsule  Take 150 mg by mouth daily with breakfast.     vitamin B-12 (CYANOCOBALAMIN) 1000 MCG tablet Take 1,000 mcg by mouth daily.     No current facility-administered medications on file prior to visit.    Allergies  Allergen Reactions   Codeine Anaphylaxis   Prednisone Anaphylaxis    Family History  Problem Relation Age of Onset   Hypertension Other    Diabetes Neg Hx     BP 112/60 (BP Location: Left Arm, Patient Position: Sitting, Cuff Size: Normal)    Pulse (!) 108    Ht 5\' 3"  (1.6 m)    Wt 132 lb 9.6 oz (60.1 kg)    SpO2 96%    BMI 23.49 kg/m   Review of Systems     Objective:   Physical Exam VITAL SIGNS:  See vs page GENERAL: no distress    Lab Results  Component Value Date   HGBA1C 9.2 (A) 02/19/2021   Lab  Results  Component Value Date   CREATININE 0.83 03/06/2021   BUN 15 03/06/2021   NA 138 03/06/2021   K 3.7 03/06/2021   CL 104 03/06/2021   CO2 29 03/06/2021      Assessment & Plan:  Insulin-requiring type 2 DM Hypoglycemia, due to insulin: we'll favor GLP rx  Patient Instructions  check your blood sugar twice a day.  vary the time of day when you check, between before the 3 meals, and at bedtime.  also check if you have symptoms of your blood sugar being too high or too low.  please keep a record of the readings and bring it to your next appointment here (or you can bring the meter itself).  You can write it on any piece of paper.  please call us sooner if your blood sugar goes below 70, or if most of your readings are over 200.   We will need to take this complex situation in stages.   I have sent a prescription to your pharmacy, to add Ozempic, and:  For now, please reduce the Tresiba to 8 units per day, and: continue Novolog 3 times a day (just before each meal), 6-5-5 units.  Please come back for a follow-up appointment in 6 weeks.

## 2021-04-05 DIAGNOSIS — F0280 Dementia in other diseases classified elsewhere without behavioral disturbance: Secondary | ICD-10-CM | POA: Diagnosis not present

## 2021-04-05 DIAGNOSIS — E039 Hypothyroidism, unspecified: Secondary | ICD-10-CM | POA: Diagnosis not present

## 2021-04-05 DIAGNOSIS — I10 Essential (primary) hypertension: Secondary | ICD-10-CM | POA: Diagnosis not present

## 2021-04-05 DIAGNOSIS — Z1159 Encounter for screening for other viral diseases: Secondary | ICD-10-CM | POA: Diagnosis not present

## 2021-04-05 DIAGNOSIS — E860 Dehydration: Secondary | ICD-10-CM | POA: Diagnosis not present

## 2021-04-05 DIAGNOSIS — I2510 Atherosclerotic heart disease of native coronary artery without angina pectoris: Secondary | ICD-10-CM | POA: Diagnosis not present

## 2021-04-05 DIAGNOSIS — M810 Age-related osteoporosis without current pathological fracture: Secondary | ICD-10-CM | POA: Diagnosis not present

## 2021-04-05 DIAGNOSIS — E785 Hyperlipidemia, unspecified: Secondary | ICD-10-CM | POA: Diagnosis not present

## 2021-04-05 DIAGNOSIS — E1165 Type 2 diabetes mellitus with hyperglycemia: Secondary | ICD-10-CM | POA: Diagnosis not present

## 2021-04-05 DIAGNOSIS — N390 Urinary tract infection, site not specified: Secondary | ICD-10-CM | POA: Diagnosis not present

## 2021-04-05 DIAGNOSIS — Z20828 Contact with and (suspected) exposure to other viral communicable diseases: Secondary | ICD-10-CM | POA: Diagnosis not present

## 2021-04-07 DIAGNOSIS — E1165 Type 2 diabetes mellitus with hyperglycemia: Secondary | ICD-10-CM | POA: Diagnosis not present

## 2021-04-07 DIAGNOSIS — Z20828 Contact with and (suspected) exposure to other viral communicable diseases: Secondary | ICD-10-CM | POA: Diagnosis not present

## 2021-04-07 DIAGNOSIS — M810 Age-related osteoporosis without current pathological fracture: Secondary | ICD-10-CM | POA: Diagnosis not present

## 2021-04-07 DIAGNOSIS — I10 Essential (primary) hypertension: Secondary | ICD-10-CM | POA: Diagnosis not present

## 2021-04-07 DIAGNOSIS — E785 Hyperlipidemia, unspecified: Secondary | ICD-10-CM | POA: Diagnosis not present

## 2021-04-07 DIAGNOSIS — E039 Hypothyroidism, unspecified: Secondary | ICD-10-CM | POA: Diagnosis not present

## 2021-04-07 DIAGNOSIS — N390 Urinary tract infection, site not specified: Secondary | ICD-10-CM | POA: Diagnosis not present

## 2021-04-07 DIAGNOSIS — E860 Dehydration: Secondary | ICD-10-CM | POA: Diagnosis not present

## 2021-04-07 DIAGNOSIS — I2510 Atherosclerotic heart disease of native coronary artery without angina pectoris: Secondary | ICD-10-CM | POA: Diagnosis not present

## 2021-04-07 DIAGNOSIS — F0280 Dementia in other diseases classified elsewhere without behavioral disturbance: Secondary | ICD-10-CM | POA: Diagnosis not present

## 2021-04-07 DIAGNOSIS — Z1159 Encounter for screening for other viral diseases: Secondary | ICD-10-CM | POA: Diagnosis not present

## 2021-04-08 DIAGNOSIS — N390 Urinary tract infection, site not specified: Secondary | ICD-10-CM | POA: Diagnosis not present

## 2021-04-08 DIAGNOSIS — E860 Dehydration: Secondary | ICD-10-CM | POA: Diagnosis not present

## 2021-04-08 DIAGNOSIS — E1165 Type 2 diabetes mellitus with hyperglycemia: Secondary | ICD-10-CM | POA: Diagnosis not present

## 2021-04-08 DIAGNOSIS — M810 Age-related osteoporosis without current pathological fracture: Secondary | ICD-10-CM | POA: Diagnosis not present

## 2021-04-08 DIAGNOSIS — I10 Essential (primary) hypertension: Secondary | ICD-10-CM | POA: Diagnosis not present

## 2021-04-08 DIAGNOSIS — I2510 Atherosclerotic heart disease of native coronary artery without angina pectoris: Secondary | ICD-10-CM | POA: Diagnosis not present

## 2021-04-08 DIAGNOSIS — E785 Hyperlipidemia, unspecified: Secondary | ICD-10-CM | POA: Diagnosis not present

## 2021-04-08 DIAGNOSIS — E039 Hypothyroidism, unspecified: Secondary | ICD-10-CM | POA: Diagnosis not present

## 2021-04-08 DIAGNOSIS — F0280 Dementia in other diseases classified elsewhere without behavioral disturbance: Secondary | ICD-10-CM | POA: Diagnosis not present

## 2021-04-10 ENCOUNTER — Inpatient Hospital Stay (HOSPITAL_COMMUNITY)
Admission: EM | Admit: 2021-04-10 | Discharge: 2021-04-14 | DRG: 689 | Disposition: A | Discharge status: Home / Self Care | Payer: Medicare HMO | Attending: Internal Medicine | Admitting: Internal Medicine

## 2021-04-10 ENCOUNTER — Emergency Department (HOSPITAL_COMMUNITY): Payer: Medicare HMO

## 2021-04-10 ENCOUNTER — Encounter (HOSPITAL_COMMUNITY): Payer: Self-pay

## 2021-04-10 DIAGNOSIS — E1165 Type 2 diabetes mellitus with hyperglycemia: Secondary | ICD-10-CM | POA: Diagnosis not present

## 2021-04-10 DIAGNOSIS — Z833 Family history of diabetes mellitus: Secondary | ICD-10-CM | POA: Diagnosis not present

## 2021-04-10 DIAGNOSIS — U071 COVID-19: Secondary | ICD-10-CM | POA: Diagnosis not present

## 2021-04-10 DIAGNOSIS — Z87440 Personal history of urinary (tract) infections: Secondary | ICD-10-CM

## 2021-04-10 DIAGNOSIS — F32A Depression, unspecified: Secondary | ICD-10-CM | POA: Diagnosis not present

## 2021-04-10 DIAGNOSIS — Z79899 Other long term (current) drug therapy: Secondary | ICD-10-CM | POA: Diagnosis not present

## 2021-04-10 DIAGNOSIS — N390 Urinary tract infection, site not specified: Secondary | ICD-10-CM | POA: Diagnosis not present

## 2021-04-10 DIAGNOSIS — B964 Proteus (mirabilis) (morganii) as the cause of diseases classified elsewhere: Secondary | ICD-10-CM | POA: Diagnosis present

## 2021-04-10 DIAGNOSIS — E871 Hypo-osmolality and hyponatremia: Secondary | ICD-10-CM | POA: Diagnosis present

## 2021-04-10 DIAGNOSIS — E785 Hyperlipidemia, unspecified: Secondary | ICD-10-CM | POA: Diagnosis present

## 2021-04-10 DIAGNOSIS — G9341 Metabolic encephalopathy: Secondary | ICD-10-CM | POA: Diagnosis present

## 2021-04-10 DIAGNOSIS — Z794 Long term (current) use of insulin: Secondary | ICD-10-CM | POA: Diagnosis not present

## 2021-04-10 DIAGNOSIS — Z885 Allergy status to narcotic agent status: Secondary | ICD-10-CM | POA: Diagnosis not present

## 2021-04-10 DIAGNOSIS — Z8249 Family history of ischemic heart disease and other diseases of the circulatory system: Secondary | ICD-10-CM

## 2021-04-10 DIAGNOSIS — R748 Abnormal levels of other serum enzymes: Secondary | ICD-10-CM

## 2021-04-10 DIAGNOSIS — R4182 Altered mental status, unspecified: Secondary | ICD-10-CM

## 2021-04-10 DIAGNOSIS — J9811 Atelectasis: Secondary | ICD-10-CM | POA: Diagnosis not present

## 2021-04-10 DIAGNOSIS — Z79890 Hormone replacement therapy (postmenopausal): Secondary | ICD-10-CM | POA: Diagnosis not present

## 2021-04-10 DIAGNOSIS — R404 Transient alteration of awareness: Secondary | ICD-10-CM | POA: Diagnosis not present

## 2021-04-10 DIAGNOSIS — Z888 Allergy status to other drugs, medicaments and biological substances status: Secondary | ICD-10-CM

## 2021-04-10 DIAGNOSIS — F0393 Unspecified dementia, unspecified severity, with mood disturbance: Secondary | ICD-10-CM | POA: Diagnosis present

## 2021-04-10 DIAGNOSIS — I10 Essential (primary) hypertension: Secondary | ICD-10-CM | POA: Diagnosis not present

## 2021-04-10 DIAGNOSIS — G4730 Sleep apnea, unspecified: Secondary | ICD-10-CM | POA: Diagnosis present

## 2021-04-10 DIAGNOSIS — R7401 Elevation of levels of liver transaminase levels: Secondary | ICD-10-CM | POA: Diagnosis not present

## 2021-04-10 DIAGNOSIS — N179 Acute kidney failure, unspecified: Secondary | ICD-10-CM | POA: Diagnosis present

## 2021-04-10 DIAGNOSIS — R0689 Other abnormalities of breathing: Secondary | ICD-10-CM | POA: Diagnosis not present

## 2021-04-10 DIAGNOSIS — R000 Tachycardia, unspecified: Secondary | ICD-10-CM | POA: Diagnosis not present

## 2021-04-10 DIAGNOSIS — R109 Unspecified abdominal pain: Secondary | ICD-10-CM | POA: Diagnosis not present

## 2021-04-10 DIAGNOSIS — R5383 Other fatigue: Secondary | ICD-10-CM | POA: Diagnosis not present

## 2021-04-10 DIAGNOSIS — R739 Hyperglycemia, unspecified: Secondary | ICD-10-CM | POA: Diagnosis not present

## 2021-04-10 DIAGNOSIS — R0602 Shortness of breath: Secondary | ICD-10-CM | POA: Diagnosis not present

## 2021-04-10 DIAGNOSIS — E860 Dehydration: Secondary | ICD-10-CM | POA: Diagnosis not present

## 2021-04-10 DIAGNOSIS — F0390 Unspecified dementia without behavioral disturbance: Secondary | ICD-10-CM | POA: Diagnosis present

## 2021-04-10 LAB — CBC WITH DIFFERENTIAL/PLATELET
Abs Immature Granulocytes: 0.03 10*3/uL (ref 0.00–0.07)
Basophils Absolute: 0 10*3/uL (ref 0.0–0.1)
Basophils Relative: 0 %
Eosinophils Absolute: 0 10*3/uL (ref 0.0–0.5)
Eosinophils Relative: 0 %
HCT: 36.9 % (ref 36.0–46.0)
Hemoglobin: 12.2 g/dL (ref 12.0–15.0)
Immature Granulocytes: 0 %
Lymphocytes Relative: 10 %
Lymphs Abs: 0.8 10*3/uL (ref 0.7–4.0)
MCH: 28.4 pg (ref 26.0–34.0)
MCHC: 33.1 g/dL (ref 30.0–36.0)
MCV: 86 fL (ref 80.0–100.0)
Monocytes Absolute: 0.9 10*3/uL (ref 0.1–1.0)
Monocytes Relative: 11 %
Neutro Abs: 6.3 10*3/uL (ref 1.7–7.7)
Neutrophils Relative %: 79 %
Platelets: 276 10*3/uL (ref 150–400)
RBC: 4.29 MIL/uL (ref 3.87–5.11)
RDW: 13.4 % (ref 11.5–15.5)
WBC: 8.1 10*3/uL (ref 4.0–10.5)
nRBC: 0 % (ref 0.0–0.2)

## 2021-04-10 LAB — COMPREHENSIVE METABOLIC PANEL
ALT: 21 U/L (ref 0–44)
AST: 23 U/L (ref 15–41)
Albumin: 3.6 g/dL (ref 3.5–5.0)
Alkaline Phosphatase: 86 U/L (ref 38–126)
Anion gap: 11 (ref 5–15)
BUN: 21 mg/dL (ref 8–23)
CO2: 23 mmol/L (ref 22–32)
Calcium: 9 mg/dL (ref 8.9–10.3)
Chloride: 97 mmol/L — ABNORMAL LOW (ref 98–111)
Creatinine, Ser: 0.9 mg/dL (ref 0.44–1.00)
GFR, Estimated: >60 mL/min (ref >60)
Glucose, Bld: 269 mg/dL — ABNORMAL HIGH (ref 70–99)
Potassium: 3.9 mmol/L (ref 3.5–5.1)
Sodium: 131 mmol/L — ABNORMAL LOW (ref 135–145)
Total Bilirubin: 0.7 mg/dL (ref 0.3–1.2)
Total Protein: 7.8 g/dL (ref 6.5–8.1)

## 2021-04-10 LAB — LIPASE, BLOOD: Lipase: 170 U/L — ABNORMAL HIGH (ref 11–51)

## 2021-04-10 NOTE — ED Provider Notes (Signed)
Emergency Medicine Provider Triage Evaluation Note  Wanda Jones , a 75 y.o. female  was evaluated in triage.  Pt brought from assisted living by [facility]. Worsening confusion. Febrile tachycardic. Endorses some SOB, is on oxygen at baseline per patient. Hx of confusion with UTIs. Does endorse frequency, denies dysuria, abdominal pain. Denies chest pain. Hx of DM, hypothyroid.  Review of Systems  Positive: As above Negative: As above  Physical Exam  BP (!) 177/80 (BP Location: Left Arm)    Pulse (!) 111    Temp (!) 100.4 F (38 C) (Oral)    Resp 18    Ht 5\' 3"  (1.6 m)    Wt 60.8 kg    SpO2 99%    BMI 23.74 kg/m  Gen:   Awake, no distress   Resp:  Poor excursion, but clear breath sounds MSK:   Moves extremities without difficulty  Other:  Minimal ttp suprapubic  Medical Decision Making  Medically screening exam initiated at 10:00 PM.  Appropriate orders placed.  Britainy Kozub was informed that the remainder of the evaluation will be completed by another provider, this initial triage assessment does not replace that evaluation, and the importance of remaining in the ED until their evaluation is complete.  Patient febrile, tachycardic, concern for UTI vs. Abdominal issue. Workup initiated   [name] 04/10/21 2202    Godfrey Pick, MD 04/10/21 [phone]

## 2021-04-10 NOTE — ED Triage Notes (Signed)
Pt BIB [facility] from [place] at [facility] per family request. Per EMS, family states pt is more confused than normal. Per EMS, family states this normally occurs when she has a UTI. Pt has hx of DM and hypothyroidism.

## 2021-04-10 NOTE — ED Notes (Signed)
CBG 269 mg/dL

## 2021-04-11 ENCOUNTER — Encounter (HOSPITAL_COMMUNITY): Payer: Self-pay

## 2021-04-11 ENCOUNTER — Emergency Department (HOSPITAL_COMMUNITY): Payer: Medicare HMO

## 2021-04-11 DIAGNOSIS — Z888 Allergy status to other drugs, medicaments and biological substances status: Secondary | ICD-10-CM | POA: Diagnosis not present

## 2021-04-11 DIAGNOSIS — U071 COVID-19: Secondary | ICD-10-CM | POA: Diagnosis present

## 2021-04-11 DIAGNOSIS — B964 Proteus (mirabilis) (morganii) as the cause of diseases classified elsewhere: Secondary | ICD-10-CM | POA: Diagnosis present

## 2021-04-11 DIAGNOSIS — Z794 Long term (current) use of insulin: Secondary | ICD-10-CM | POA: Diagnosis not present

## 2021-04-11 DIAGNOSIS — E1165 Type 2 diabetes mellitus with hyperglycemia: Secondary | ICD-10-CM | POA: Diagnosis present

## 2021-04-11 DIAGNOSIS — Z79890 Hormone replacement therapy (postmenopausal): Secondary | ICD-10-CM | POA: Diagnosis not present

## 2021-04-11 DIAGNOSIS — N179 Acute kidney failure, unspecified: Secondary | ICD-10-CM | POA: Diagnosis present

## 2021-04-11 DIAGNOSIS — F0393 Unspecified dementia, unspecified severity, with mood disturbance: Secondary | ICD-10-CM | POA: Diagnosis present

## 2021-04-11 DIAGNOSIS — G4730 Sleep apnea, unspecified: Secondary | ICD-10-CM | POA: Diagnosis present

## 2021-04-11 DIAGNOSIS — R109 Unspecified abdominal pain: Secondary | ICD-10-CM | POA: Diagnosis not present

## 2021-04-11 DIAGNOSIS — Z79899 Other long term (current) drug therapy: Secondary | ICD-10-CM | POA: Diagnosis not present

## 2021-04-11 DIAGNOSIS — Z885 Allergy status to narcotic agent status: Secondary | ICD-10-CM | POA: Diagnosis not present

## 2021-04-11 DIAGNOSIS — G9341 Metabolic encephalopathy: Secondary | ICD-10-CM | POA: Diagnosis present

## 2021-04-11 DIAGNOSIS — E785 Hyperlipidemia, unspecified: Secondary | ICD-10-CM | POA: Diagnosis present

## 2021-04-11 DIAGNOSIS — N390 Urinary tract infection, site not specified: Secondary | ICD-10-CM

## 2021-04-11 DIAGNOSIS — R748 Abnormal levels of other serum enzymes: Secondary | ICD-10-CM | POA: Diagnosis present

## 2021-04-11 DIAGNOSIS — Z8249 Family history of ischemic heart disease and other diseases of the circulatory system: Secondary | ICD-10-CM | POA: Diagnosis not present

## 2021-04-11 DIAGNOSIS — E860 Dehydration: Secondary | ICD-10-CM | POA: Diagnosis not present

## 2021-04-11 DIAGNOSIS — N3000 Acute cystitis without hematuria: Secondary | ICD-10-CM | POA: Insufficient documentation

## 2021-04-11 DIAGNOSIS — F32A Depression, unspecified: Secondary | ICD-10-CM | POA: Diagnosis present

## 2021-04-11 DIAGNOSIS — Z833 Family history of diabetes mellitus: Secondary | ICD-10-CM | POA: Diagnosis not present

## 2021-04-11 DIAGNOSIS — Z87440 Personal history of urinary (tract) infections: Secondary | ICD-10-CM | POA: Diagnosis not present

## 2021-04-11 DIAGNOSIS — E871 Hypo-osmolality and hyponatremia: Secondary | ICD-10-CM | POA: Diagnosis present

## 2021-04-11 HISTORY — DX: Urinary tract infection, site not specified: N39.0

## 2021-04-11 LAB — URINALYSIS, ROUTINE W REFLEX MICROSCOPIC
Bilirubin Urine: NEGATIVE
Glucose, UA: >1000 mg/dL — AB
Ketones, ur: 15 mg/dL — AB
Nitrite: NEGATIVE
Protein, ur: 30 mg/dL — AB
Specific Gravity, Urine: 1.015 (ref 1.005–1.030)
WBC, UA: >50 WBC/hpf — ABNORMAL HIGH (ref 0–5)
pH: 8 (ref 5.0–8.0)

## 2021-04-11 LAB — CBG MONITORING, ED
Glucose-Capillary: 221 mg/dL — ABNORMAL HIGH (ref 70–99)
Glucose-Capillary: 51 mg/dL — ABNORMAL LOW (ref 70–99)
Glucose-Capillary: 87 mg/dL (ref 70–99)
Glucose-Capillary: 280 mg/dL — ABNORMAL HIGH (ref 70–99)

## 2021-04-11 LAB — GLUCOSE, CAPILLARY: Glucose-Capillary: 476 mg/dL — ABNORMAL HIGH (ref 70–99)

## 2021-04-11 LAB — LACTIC ACID, PLASMA: Lactic Acid, Venous: 1.5 mmol/L (ref 0.5–1.9)

## 2021-04-11 LAB — PROTIME-INR
INR: 1 (ref 0.8–1.2)
Prothrombin Time: 13.2 seconds (ref 11.4–15.2)

## 2021-04-11 LAB — RESP PANEL BY RT-PCR (FLU A&B, COVID) ARPGX2
Influenza A by PCR: NEGATIVE
Influenza B by PCR: NEGATIVE
SARS Coronavirus 2 by RT PCR: POSITIVE — AB

## 2021-04-11 LAB — APTT: aPTT: 27 seconds (ref 24–36)

## 2021-04-11 MED ORDER — ONDANSETRON HCL 4 MG/2ML IJ SOLN: 4.0000 mg | Freq: Four times a day (QID) | INTRAMUSCULAR | Status: DC | PRN

## 2021-04-11 MED ORDER — INSULIN ASPART 100 UNIT/ML IJ SOLN
5.0000 [IU] | Freq: Once | INTRAMUSCULAR | Status: AC
  Administered 2021-04-11: 5 [IU] via SUBCUTANEOUS

## 2021-04-11 MED ORDER — NIRMATRELVIR/RITONAVIR (PAXLOVID)TABLET
3.0000 | ORAL_TABLET | Freq: Two times a day (BID) | ORAL | Status: DC
  Administered 2021-04-11 – 2021-04-14 (×7): 3 via ORAL
  Filled 2021-04-11: qty 30

## 2021-04-11 MED ORDER — ENOXAPARIN SODIUM 40 MG/0.4ML IJ SOSY
40.0000 mg | PREFILLED_SYRINGE | INTRAMUSCULAR | Status: DC
  Administered 2021-04-11 – 2021-04-14 (×4): 40 mg via SUBCUTANEOUS
  Filled 2021-04-11 (×4): qty 0.4

## 2021-04-11 MED ORDER — PANTOPRAZOLE SODIUM 40 MG PO TBEC
40.0000 mg | DELAYED_RELEASE_TABLET | Freq: Every day | ORAL | Status: DC
  Administered 2021-04-12 – 2021-04-14 (×3): 40 mg via ORAL
  Filled 2021-04-11 (×3): qty 1

## 2021-04-11 MED ORDER — ACETAMINOPHEN 325 MG PO TABS
650.0000 mg | ORAL_TABLET | Freq: Four times a day (QID) | ORAL | Status: DC | PRN
  Administered 2021-04-11: 23:00:00 650 mg via ORAL
  Filled 2021-04-11: qty 2

## 2021-04-11 MED ORDER — ACETAMINOPHEN 650 MG RE SUPP: 650.0000 mg | Freq: Four times a day (QID) | RECTAL | Status: DC | PRN

## 2021-04-11 MED ORDER — ALBUTEROL SULFATE (2.5 MG/3ML) 0.083% IN NEBU: 2.5000 mg | INHALATION_SOLUTION | Freq: Four times a day (QID) | RESPIRATORY_TRACT | Status: DC | PRN

## 2021-04-11 MED ORDER — INSULIN GLARGINE-YFGN 100 UNIT/ML ~~LOC~~ SOLN
8.0000 [IU] | Freq: Every day | SUBCUTANEOUS | Status: DC
  Administered 2021-04-11 – 2021-04-14 (×4): 8 [IU] via SUBCUTANEOUS
  Filled 2021-04-11 (×4): qty 0.08

## 2021-04-11 MED ORDER — VENLAFAXINE HCL ER 75 MG PO CP24
150.0000 mg | ORAL_CAPSULE | Freq: Every day | ORAL | Status: DC
  Administered 2021-04-12 – 2021-04-14 (×3): 150 mg via ORAL
  Filled 2021-04-11 (×3): qty 2

## 2021-04-11 MED ORDER — LEVOTHYROXINE SODIUM 88 MCG PO TABS
88.0000 ug | ORAL_TABLET | Freq: Every day | ORAL | Status: DC
  Administered 2021-04-12 – 2021-04-14 (×3): 88 ug via ORAL
  Filled 2021-04-11 (×3): qty 1

## 2021-04-11 MED ORDER — IOHEXOL 350 MG/ML SOLN
80.0000 mL | Freq: Once | INTRAVENOUS | Status: AC | PRN
  Administered 2021-04-11: 03:00:00 80 mL via INTRAVENOUS

## 2021-04-11 MED ORDER — DONEPEZIL HCL 5 MG PO TABS
5.0000 mg | ORAL_TABLET | Freq: Every day | ORAL | Status: DC
  Administered 2021-04-11 – 2021-04-13 (×3): 5 mg via ORAL
  Filled 2021-04-11 (×3): qty 1

## 2021-04-11 MED ORDER — LACTATED RINGERS IV BOLUS
1000.0000 mL | Freq: Once | INTRAVENOUS | Status: AC
  Administered 2021-04-11: 03:00:00 1000 mL via INTRAVENOUS

## 2021-04-11 MED ORDER — PANTOPRAZOLE SODIUM 40 MG IV SOLR
40.0000 mg | Freq: Once | INTRAVENOUS | Status: AC
  Administered 2021-04-11: 14:00:00 40 mg via INTRAVENOUS
  Filled 2021-04-11: qty 40

## 2021-04-11 MED ORDER — ACETAMINOPHEN 325 MG PO TABS
650.0000 mg | ORAL_TABLET | Freq: Once | ORAL | Status: AC
  Administered 2021-04-11: 02:00:00 650 mg via ORAL
  Filled 2021-04-11: qty 2

## 2021-04-11 MED ORDER — VITAMIN B-12 1000 MCG PO TABS
1000.0000 ug | ORAL_TABLET | Freq: Every day | ORAL | Status: DC
  Administered 2021-04-12 – 2021-04-14 (×3): 1000 ug via ORAL
  Filled 2021-04-11 (×3): qty 1

## 2021-04-11 MED ORDER — CEFTRIAXONE SODIUM 1 G IJ SOLR
1.0000 g | INTRAMUSCULAR | Status: DC
  Administered 2021-04-12 – 2021-04-13 (×2): 1 g via INTRAVENOUS
  Filled 2021-04-11 (×2): qty 10

## 2021-04-11 MED ORDER — ONDANSETRON HCL 4 MG PO TABS: 4.0000 mg | ORAL_TABLET | Freq: Four times a day (QID) | ORAL | Status: DC | PRN

## 2021-04-11 MED ORDER — GUAIFENESIN-DM 100-10 MG/5ML PO SYRP: 10.0000 mL | ORAL_SOLUTION | ORAL | Status: DC | PRN

## 2021-04-11 MED ORDER — PHENOL 1.4 % MT LIQD
1.0000 | OROMUCOSAL | Status: DC | PRN
  Administered 2021-04-11: 1 via OROMUCOSAL
  Filled 2021-04-11: qty 177

## 2021-04-11 MED ORDER — ROSUVASTATIN CALCIUM 5 MG PO TABS
5.0000 mg | ORAL_TABLET | Freq: Every day | ORAL | Status: DC
  Administered 2021-04-11: 23:00:00 5 mg via ORAL
  Filled 2021-04-11: qty 1

## 2021-04-11 MED ORDER — INSULIN ASPART 100 UNIT/ML IJ SOLN
0.0000 [IU] | Freq: Three times a day (TID) | INTRAMUSCULAR | Status: DC
  Administered 2021-04-11: 08:00:00 5 [IU] via SUBCUTANEOUS
  Filled 2021-04-11: qty 0.15

## 2021-04-11 MED ORDER — MIRABEGRON ER 25 MG PO TB24
50.0000 mg | ORAL_TABLET | Freq: Every day | ORAL | Status: DC
  Administered 2021-04-12 – 2021-04-14 (×3): 50 mg via ORAL
  Filled 2021-04-11 (×3): qty 2

## 2021-04-11 MED ORDER — SODIUM CHLORIDE 0.9 % IV SOLN
2.0000 g | Freq: Once | INTRAVENOUS | Status: AC
  Administered 2021-04-11: 06:00:00 2 g via INTRAVENOUS
  Filled 2021-04-11: qty 20

## 2021-04-11 MED ORDER — MEMANTINE HCL 5 MG PO TABS
5.0000 mg | ORAL_TABLET | Freq: Two times a day (BID) | ORAL | Status: DC
  Administered 2021-04-11 – 2021-04-14 (×6): 5 mg via ORAL
  Filled 2021-04-11 (×6): qty 1

## 2021-04-11 MED ORDER — NIRMATRELVIR/RITONAVIR (PAXLOVID) TABLET (RENAL DOSING): 2.0000 | ORAL_TABLET | Freq: Two times a day (BID) | ORAL | Status: DC

## 2021-04-11 MED ORDER — BISMUTH SUBSALICYLATE 262 MG/15ML PO SUSP
30.0000 mL | ORAL | Status: DC | PRN
  Administered 2021-04-11: 14:00:00 30 mL via ORAL
  Filled 2021-04-11: qty 236

## 2021-04-11 NOTE — H&P (Signed)
History and Physical    Wanda Jones SAY:[id number] DOB: 1945/06/20 DOA: 04/10/2021  PCP: Roetta Sessions, NP  Patient coming from: [place] at [facility] facility.  I have personally briefly reviewed patient's old medical records in [place]  Chief Complaint: Confusion.  HPI: Wanda Jones is a 75 y.o. female with medical history significant of dementia, type I DM, hyperlipidemia, history of frequent UTIs who is sent from her facility to the emergency department due to altered mental status, which frequently happens when she has a urine tract infection.  She is somnolent but wakes up easily.  She is unable to elaborate, but answers simple questions.  She denied headache, back, chest or abdominal pain.  ED Course: Initial vital signs were temperature 100.4 F, pulse 111, respiration 18, BP 177/80 mmHg O2 sat 99% on room air.  The patient received acetaminophen 650 mg p.o. x1, ceftriaxone 2 g IVPB x1 and 1000 mL of LR bolus.  Lab work: A urinalysis showed glucosuria more than 1000 mg deciliter, trace hemoglobinuria, ketonuria and 15 and proteinuria 30 mg/dL.  It was moderate leukocyte esterase, more than 50 WBC with a few bacteria on microscopic examination.  CBC showed a white count of 8.1 with 79% neutrophils, hemoglobin 12.2 g/dL platelets 276.  Lipase was 170.  Normal PT, INR and PTT.  CMP showed a sodium 131 and chloride 97 mmol/L but the patient was hyperglycemic with a blood glucose level of 269 mg/dL.  The rest of the CMP measurements were normal.  Lactic acid was normal.  Imaging: A 2 view chest radiograph showed low lung volumes with bibasilar atelectasis and chronic bronchitic lung changes.  However there was no acute cardiopulmonary disease.  CT abdomen/pelvis with contrast did not show any evidence of pancreatitis.  There was hepatic asteatosis, hiatal hernia, diverticulosis without diverticulitis and a very small nonobstructing left renal urolith.  Please see images  and full radiology report for further details.  Review of Systems: As per HPI otherwise all other systems reviewed and are negative.  Past Medical History:  Diagnosis Date   Dementia ([place]) 01/24/2021   Diabetes mellitus type 1 ([place])    History of recurrent UTIs    Hyperlipidemia 01/24/2021   History reviewed. No pertinent surgical history.  Social History  reports that she has never smoked. She does not have any smokeless tobacco history on file. No history on file for alcohol use and drug use.  Allergies  Allergen Reactions   Codeine Anaphylaxis   Prednisone Anaphylaxis   Family History  Problem Relation Age of Onset   Hypertension Other    Diabetes Neg Hx    Prior to Admission medications   Medication Sig Start Date End Date Taking? Authorizing Provider  acetaminophen (TYLENOL) 325 MG tablet Take 325 mg by mouth every 6 (six) hours as needed for moderate pain or headache.    [provider]  cetirizine (ZYRTEC) 10 MG tablet Take 10 mg by mouth daily as needed for allergies.    [provider]  clobetasol (TEMOVATE) 0.05 % external solution Apply 1 application topically 2 (two) times daily as needed (irritation).    [provider]  Continuous Blood Gluc Receiver (FREESTYLE LIBRE 2 READER) DEVI Use as directed. E10.69 03/17/21   Renato Shin, MD  donepezil (ARICEPT) 5 MG tablet Take 5 mg by mouth at bedtime.    [provider]  insulin aspart (NOVOLOG FLEXPEN) 100 UNIT/ML FlexPen 3 times a day (just before each meal), 6-5-5 units.  Patient taking differently: 5-6 Units 3 (three) times daily with meals. Take 6 units in the morning, Take 5 units in the afternoon & Take 5 units in the evening 02/19/21   Renato Shin, MD  insulin degludec (TRESIBA FLEXTOUCH) 100 UNIT/ML FlexTouch Pen Inject 8 Units into the skin daily. 04/02/21   Renato Shin, MD  levothyroxine (SYNTHROID) 88 MCG tablet Take 88 mcg by mouth daily before breakfast.    [provider]  memantine (NAMENDA) 5 MG tablet Take 5 mg by mouth 2 (two) times daily.    [provider]  Multiple Vitamins-Minerals (MULTIVITAMIN ADULTS) TABS Take 1 tablet by mouth daily.    [provider]  MYRBETRIQ 50 MG TB24 tablet Take 50 mg by mouth daily. 11/27/20   [provider]  rosuvastatin (CRESTOR) 5 MG tablet Take 5 mg by mouth at bedtime.    [provider]  Semaglutide,0.25 or 0.5MG /DOS, (OZEMPIC, 0.25 OR 0.5 MG/DOSE,) 2 MG/1.5ML SOPN Inject 0.5 mg into the skin once a week. 04/02/21   Renato Shin, MD  venlafaxine XR (EFFEXOR-XR) 150 MG 24 hr capsule Take 150 mg by mouth daily with breakfast.    [provider]  vitamin B-12 (CYANOCOBALAMIN) 1000 MCG tablet Take 1,000 mcg by mouth daily.    [provider]   Physical Exam: Vitals:   04/11/21 0530 04/11/21 0600 04/11/21 0630 04/11/21 0700  BP: 135/64 (!) 118/59 118/60 (!) 108/56  Pulse: 84 83 81 78  Resp: (!) 22 17 16 20   Temp:      TempSrc:      SpO2: 98% 99% 99% 99%  Weight:      Height:       Constitutional: Chronically ill-appearing.  NAD, calm, comfortable Eyes: PERRL, lids and conjunctivae normal ENMT: Mucous membranes are moist. Posterior pharynx clear of any exudate or lesions. Neck: normal, supple, no masses, no thyromegaly Respiratory: Decreased breath sounds in bases, otherwise clear to auscultation bilaterally, no wheezing, no crackles. Normal respiratory effort. No accessory muscle use.  Cardiovascular: Regular rate and rhythm, no murmurs / rubs / gallops. No extremity edema. 2+ pedal pulses. No carotid bruits.  Abdomen: No distention.  Soft, no tenderness, no masses palpated. No hepatosplenomegaly. Bowel sounds positive.  Musculoskeletal: Moderate generalized weakness.  No clubbing / cyanosis.  Good ROM, no contractures. Normal muscle tone.  Skin: no acute rashes, lesions, ulcers on very limited dermatological semination Neurologic: CN 2-12 grossly  intact. Sensation intact, DTR normal. Strength 5/5 in all 4.  Psychiatric:  Alert and oriented x 1.  Disoriented to place, situation, time and date.  Labs on Admission: I have personally reviewed following labs and imaging studies  CBC: Recent Labs  Lab 04/10/21 2158  WBC 8.1  NEUTROABS 6.3  HGB 12.2  HCT 36.9  MCV 86.0  PLT 734    Basic Metabolic Panel: Recent Labs  Lab 04/10/21 2158  NA 131*  K 3.9  CL 97*  CO2 23  GLUCOSE 269*  BUN 21  CREATININE 0.90  CALCIUM 9.0    GFR: Estimated Creatinine Clearance: 44 mL/min (by C-G formula based on SCr of 0.9 mg/dL).  Liver Function Tests: Recent Labs  Lab 04/10/21 2158  AST 23  ALT 21  ALKPHOS 86  BILITOT 0.7  PROT 7.8  ALBUMIN 3.6   Urine analysis:    Component Value Date/Time   COLORURINE YELLOW (A) 04/11/2021 0322   APPEARANCEUR HAZY (A) 04/11/2021 0322   LABSPEC 1.015 04/11/2021 0322   PHURINE 8.0  04/11/2021 0322   GLUCOSEU >1,000 (A) 04/11/2021 0322   HGBUR TRACE (A) 04/11/2021 0322   BILIRUBINUR NEGATIVE 04/11/2021 0322   KETONESUR 15 (A) 04/11/2021 0322   PROTEINUR 30 (A) 04/11/2021 0322   NITRITE NEGATIVE 04/11/2021 0322   LEUKOCYTESUR MODERATE (A) 04/11/2021 0322   Radiological Exams on Admission: DG Chest 2 View  Result Date: 04/10/2021 CLINICAL DATA:  One week of shortness of breath, increased fatigue EXAM: CHEST - 2 VIEW COMPARISON:  March 05, 2021 FINDINGS: The heart size and mediastinal contours are within normal limits. Aortic calcifications. Low lung volumes with bibasilar atelectasis degenerative change of the bilateral shoulders, severe on the right. Prior bilateral clavicular fractures. Prior vertebral augmentation. IMPRESSION: Low lung volumes with bibasilar atelectasis and chronic bronchitic lung changes without acute cardiopulmonary disease. Electronically Signed   By: Dahlia Bailiff M.D.   On: 04/10/2021 22:42   CT ABDOMEN PELVIS W CONTRAST  Result Date: 04/11/2021 CLINICAL DATA:   Abdominal pain EXAM: CT ABDOMEN AND PELVIS WITH CONTRAST TECHNIQUE: Multidetector CT imaging of the abdomen and pelvis was performed using the standard protocol following bolus administration of intravenous contrast. CONTRAST:  4mL OMNIPAQUE IOHEXOL 350 MG/ML SOLN COMPARISON:  None. FINDINGS: Lower chest: Lung bases demonstrate some mild atelectasis on the left. Hiatal hernia is noted. Hepatobiliary: Fatty infiltration of the liver is noted. The gallbladder is decompressed. Pancreas: Pancreas is within normal limits. No inflammatory changes to suggest pancreatitis are noted. A duodenal diverticulum is noted adjacent to the head of the pancreas. Spleen: Scattered calcified granulomas are noted. Adrenals/Urinary Tract: Adrenal glands are within normal limits. Kidneys demonstrate a normal enhancement pattern bilaterally. Tiny nonobstructing renal stone is noted in the lower pole of the left kidney. Fullness of the right renal pelvis is seen although no true obstructive changes are noted. The bladder is decompressed. Stomach/Bowel: Scattered diverticular changes noted without evidence of diverticulitis. The appendix is within normal limits. Small bowel and stomach are within normal limits. Vascular/Lymphatic: Aortic atherosclerosis. No enlarged abdominal or pelvic lymph nodes. Reproductive: Uterus and bilateral adnexa are unremarkable. Other: No abdominal wall hernia or abnormality. No abdominopelvic ascites. Musculoskeletal: Postsurgical changes consistent with right hip replacement are seen. Degenerative changes of lumbar spine are noted. No acute rib abnormality is noted. IMPRESSION: No evidence of pancreatitis. Fatty liver. Hiatal hernia. Tiny nonobstructing left renal stone. Diverticulosis without diverticulitis. Electronically Signed   By: Inez Catalina M.D.   On: 04/11/2021 03:25    EKG: Independently reviewed.  Vent. rate 113 BPM PR interval 135 ms QRS duration 61 ms QT/QTcB 293/402 ms P-R-T axes 62 19  54 Sinus tachycardia Probable left atrial enlargement Low voltage, extremity and precordial leads Consider anterior infarct  Assessment/Plan Principal Problem:   Acute metabolic encephalopathy In the setting of:   Acute lower UTI (urinary tract infection) Observation/telemetry. Continue Myrbetriq. Continue ceftriaxone 1 g every 24 hours. Follow-up blood culture and sensitivity. Follow-up urine culture and sensitivity.  Active Problems:   Incidental finding of COVID-19 virus infection She has no oxygen requirement. Incentive spirometry and flutter valve. Begin Paxlovid BID.  Further intervention if more symptoms develop.    Dementia Supportive care. Continue Namenda 5 mg p.o. twice daily. Continue donezepil 5 mg p.o. bedtime. Reorient as needed.    Hyperlipidemia Continue rosuvastatin 5 mg p.o. daily.    Sleep apnea Not on CPAP.    Depression Continue venlafaxine XR 150 mg p.o. daily.   Type 2 diabetes mellitus with hyperglycemia (HCC) Carbohydrate modified diet. CBG monitoring with RI  SS.    Elevated lipase No epigastric abdominal pain on palpation. CT did not show pancreatitis trend. Consider repeating lipase or further work-up if clinically suspected.    DVT prophylaxis: Lovenox SQ. Code Status:   Full code.  Family Communication:   Disposition Plan:   Patient is from:  Home.   Anticipated DC to:  Home.   Anticipated DC date:  04/13/2021  Anticipated DC barriers: Clinical status.  Consults called:   Admission status:  Observation/telemetry.   Severity of Illness: High severity due to AMS in the setting of urinary tract infection.  The patient will remain in the hospital for IV hydration and antibiotic therapy.  Reubin Milan MD [facility]  How to contact the [facility] Attending or Consulting provider [place] or covering provider during after hours [place], for this patient?   Check the care team in [facility] and look for a) attending/consulting [facility]  provider listed and b) the [facility] team listed Log into [email or web address] and use [place]'s universal password to access. If you do not have the password, please contact the hospital operator. Locate the [facility] provider you are looking for under [facility] and page to a number that you can be directly reached. If you still have difficulty reaching the provider, please page the [facility] (Director on Call) for the Hospitalists listed on amion for assistance.  04/11/2021, 7:46 AM   This document was prepared using Dragon voice recognition software and may contain some unintended transcription errors.

## 2021-04-11 NOTE — Plan of Care (Signed)

## 2021-04-11 NOTE — ED Notes (Signed)
Per CBG. Orange juice provided.

## 2021-04-11 NOTE — Progress Notes (Signed)
[facility] admitting physician addendum.  The patient was complaining of indigestion.  Pepto-Bismol 30 mL every 4 hours as needed, pantoprazole 40 mg IVP x1 followed by 40 mg p.o. starting tomorrow morning ordered.  Tennis Must, MD

## 2021-04-11 NOTE — ED Provider Notes (Signed)
[facility] DEPT Provider Note   CSN: [id number] Arrival date & time: 04/10/21  2130     History Chief Complaint  Patient presents with   Altered Mental Status    Wanda Jones is a 75 y.o. female.  The history is provided by the patient and the EMS personnel. The history is limited by the condition of the patient (Dementia).  Altered Mental Status She has history of diabetes, hyperlipidemia, dementia, frequent urinary tract infections was sent in by family because of confusion.  Family is concerned that she gets more confused when she has UTIs.  Patient denies fever, chills, sweats.  She has had a nonproductive cough.  She denies nausea, vomiting, diarrhea.  She denies abdominal pain.  She denies any urinary difficulty.   Past Medical History:  Diagnosis Date   Dementia ([place]) 01/24/2021   Diabetes mellitus type 1 ([place])    History of recurrent UTIs    Hyperlipidemia 01/24/2021    Patient Active Problem List   Diagnosis Date Noted   Pressure injury of skin 03/07/2021   Weakness 03/06/2021   Sepsis secondary to UTI ([place]) 01/24/2021   Volume depletion 01/24/2021   Hyperlipidemia 01/24/2021   Dementia ([place]) 01/24/2021   Acute encephalopathy 01/24/2021   Mild protein malnutrition ([place]) 01/24/2021   Sepsis ([place]) 01/02/2021   UTI (urinary tract infection) 01/02/2021   Cellulitis 01/02/2021   Hyperglycemia 01/02/2021   AKI (acute kidney injury) ([place]) 01/02/2021   Diabetes mellitus ([place]) 01/02/2021    History reviewed. No pertinent surgical history.   OB History   No obstetric history on file.     Family History  Problem Relation Age of Onset   Hypertension Other    Diabetes Neg Hx     Social History   Tobacco Use   Smoking status: Never    Home Medications Prior to Admission medications   Medication Sig Start Date End Date Taking? Authorizing Provider  acetaminophen (TYLENOL) 325 MG tablet Take 325 mg by mouth every 6 (six) hours  as needed for moderate pain or headache.    [provider]  cetirizine (ZYRTEC) 10 MG tablet Take 10 mg by mouth daily as needed for allergies.    [provider]  clobetasol (TEMOVATE) 0.05 % external solution Apply 1 application topically 2 (two) times daily as needed (irritation).    [provider]  Continuous Blood Gluc Receiver (FREESTYLE LIBRE 2 READER) DEVI Use as directed. E10.69 03/17/21   Renato Shin, MD  donepezil (ARICEPT) 5 MG tablet Take 5 mg by mouth at bedtime.    [provider]  insulin aspart (NOVOLOG FLEXPEN) 100 UNIT/ML FlexPen 3 times a day (just before each meal), 6-5-5 units. Patient taking differently: 5-6 Units 3 (three) times daily with meals. Take 6 units in the morning, Take 5 units in the afternoon & Take 5 units in the evening 02/19/21   Renato Shin, MD  insulin degludec (TRESIBA FLEXTOUCH) 100 UNIT/ML FlexTouch Pen Inject 8 Units into the skin daily. 04/02/21   Renato Shin, MD  levothyroxine (SYNTHROID) 88 MCG tablet Take 88 mcg by mouth daily before breakfast.    [provider]  memantine (NAMENDA) 5 MG tablet Take 5 mg by mouth 2 (two) times daily.    [provider]  Multiple Vitamins-Minerals (MULTIVITAMIN ADULTS) TABS Take 1 tablet by mouth daily.    [provider]  MYRBETRIQ 50 MG TB24 tablet Take 50 mg by mouth daily. 11/27/20   [provider]  rosuvastatin (CRESTOR) 5 MG tablet Take 5 mg by mouth at bedtime.    [provider]  Semaglutide,0.25 or 0.5MG /DOS, (OZEMPIC, 0.25 OR 0.5 MG/DOSE,) 2 MG/1.5ML SOPN Inject 0.5 mg into the skin once a week. 04/02/21   Renato Shin, MD  venlafaxine XR (EFFEXOR-XR) 150 MG 24 hr capsule Take 150 mg by mouth daily with breakfast.    [provider]  vitamin B-12 (CYANOCOBALAMIN) 1000 MCG tablet Take 1,000 mcg by mouth daily.    [provider]    Allergies    Codeine and Prednisone  Review of Systems   Review of  Systems  Unable to perform ROS: Dementia   Physical Exam Updated Vital Signs BP (!) 141/69    Pulse (!) 106    Temp (!) 100.4 F (38 C) (Oral)    Resp 17    Ht 5\' 3"  (1.6 m)    Wt 60.8 kg    SpO2 99%    BMI 23.74 kg/m   Physical Exam Vitals and nursing note reviewed.  75 year old female, resting comfortably and in no acute distress. Vital signs are again for elevated temperature, heart rate, blood pressure. Oxygen saturation is 99%, which is normal. Head is normocephalic and atraumatic. PERRLA, EOMI. Oropharynx is clear. Neck is nontender and supple without adenopathy or JVD. Back is nontender and there is no CVA tenderness. Lungs are clear without rales, wheezes, or rhonchi. Chest is nontender. Heart has regular rate and rhythm without murmur. Abdomen is soft, flat, nontender without masses or hepatosplenomegaly and peristalsis is hypoactive. Extremities have no cyanosis or edema, full range of motion is present. Skin is warm and dry without rash. Neurologic: Awake but slow to answer questions.  Oriented to person and date but not place.  Cranial nerves are intact.moves all extremities equally.  ED Results / Procedures / Treatments   Labs (all labs ordered are listed, but only abnormal results are displayed) Labs Reviewed  COMPREHENSIVE METABOLIC PANEL - Abnormal; Notable for the following components:      Result Value   Sodium 131 (*)    Chloride 97 (*)    Glucose, Bld 269 (*)    All other components within normal limits  URINALYSIS, ROUTINE W REFLEX MICROSCOPIC - Abnormal; Notable for the following components:   Color, Urine YELLOW (*)    APPearance HAZY (*)    Glucose, UA >1,000 (*)    Hgb urine dipstick TRACE (*)    Ketones, ur 15 (*)    Protein, ur 30 (*)    Leukocytes,Ua MODERATE (*)    WBC, UA >50 (*)    Bacteria, UA FEW (*)    All other components within normal limits  LIPASE, BLOOD - Abnormal; Notable for the following components:   Lipase 170 (*)    All other  components within normal limits  URINE CULTURE  RESP PANEL BY RT-PCR (FLU A&B, COVID) ARPGX2  CULTURE, BLOOD (ROUTINE X 2)  CULTURE, BLOOD (ROUTINE X 2)  CBC WITH DIFFERENTIAL/PLATELET  LACTIC ACID, PLASMA  PROTIME-INR  APTT  CBG MONITORING, ED    EKG EKG Interpretation  Date/Time:  Saturday April 10 2021 21:55:15 EST Ventricular Rate:  113 PR Interval:  135 QRS Duration: 61 QT Interval:  293 QTC Calculation: 402 R Axis:   19 Text Interpretation: Sinus tachycardia Probable left atrial enlargement Low voltage, extremity and precordial leads Consider anterior infarct When compared with ECG of 01/02/2021, No significant change was found Confirmed by Delora Fuel ([id number]) on 04/11/2021  12:37:50 AM  Radiology DG Chest 2 View  Result Date: 04/10/2021 CLINICAL DATA:  One week of shortness of breath, increased fatigue EXAM: CHEST - 2 VIEW COMPARISON:  March 05, 2021 FINDINGS: The heart size and mediastinal contours are within normal limits. Aortic calcifications. Low lung volumes with bibasilar atelectasis degenerative change of the bilateral shoulders, severe on the right. Prior bilateral clavicular fractures. Prior vertebral augmentation. IMPRESSION: Low lung volumes with bibasilar atelectasis and chronic bronchitic lung changes without acute cardiopulmonary disease. Electronically Signed   By: Dahlia Bailiff M.D.   On: 04/10/2021 22:42   CT ABDOMEN PELVIS W CONTRAST  Result Date: 04/11/2021 CLINICAL DATA:  Abdominal pain EXAM: CT ABDOMEN AND PELVIS WITH CONTRAST TECHNIQUE: Multidetector CT imaging of the abdomen and pelvis was performed using the standard protocol following bolus administration of intravenous contrast. CONTRAST:  65mL OMNIPAQUE IOHEXOL 350 MG/ML SOLN COMPARISON:  None. FINDINGS: Lower chest: Lung bases demonstrate some mild atelectasis on the left. Hiatal hernia is noted. Hepatobiliary: Fatty infiltration of the liver is noted. The gallbladder is decompressed.  Pancreas: Pancreas is within normal limits. No inflammatory changes to suggest pancreatitis are noted. A duodenal diverticulum is noted adjacent to the head of the pancreas. Spleen: Scattered calcified granulomas are noted. Adrenals/Urinary Tract: Adrenal glands are within normal limits. Kidneys demonstrate a normal enhancement pattern bilaterally. Tiny nonobstructing renal stone is noted in the lower pole of the left kidney. Fullness of the right renal pelvis is seen although no true obstructive changes are noted. The bladder is decompressed. Stomach/Bowel: Scattered diverticular changes noted without evidence of diverticulitis. The appendix is within normal limits. Small bowel and stomach are within normal limits. Vascular/Lymphatic: Aortic atherosclerosis. No enlarged abdominal or pelvic lymph nodes. Reproductive: Uterus and bilateral adnexa are unremarkable. Other: No abdominal wall hernia or abnormality. No abdominopelvic ascites. Musculoskeletal: Postsurgical changes consistent with right hip replacement are seen. Degenerative changes of lumbar spine are noted. No acute rib abnormality is noted. IMPRESSION: No evidence of pancreatitis. Fatty liver. Hiatal hernia. Tiny nonobstructing left renal stone. Diverticulosis without diverticulitis. Electronically Signed   By: Inez Catalina M.D.   On: 04/11/2021 03:25    Procedures Procedures  CRITICAL CARE Performed by: Delora Fuel Total critical care time: 40 minutes Critical care time was exclusive of separately billable procedures and treating other patients. Critical care was necessary to treat or prevent imminent or life-threatening deterioration. Critical care was time spent personally by me on the following activities: development of treatment plan with patient and/or surrogate as well as nursing, discussions with consultants, evaluation of patient's response to treatment, examination of patient, obtaining history from patient or surrogate, ordering and  performing treatments and interventions, ordering and review of laboratory studies, ordering and review of radiographic studies, pulse oximetry and re-evaluation of patient's condition.  Medications Ordered in ED Medications  lactated ringers bolus 1,000 mL (has no administration in time range)  acetaminophen (TYLENOL) tablet 650 mg (has no administration in time range)    ED Course  I have reviewed the triage vital signs and the nursing notes.  Pertinent labs & imaging results that were available during my care of the patient were reviewed by me and considered in my medical decision making (see chart for details).    MDM Rules/Calculators/A&P                         Fever with confusion concerning for possible occult infection such as UTI or influenza or COVID-19.  Old records are reviewed, and she does have several recent hospitalizations for urinary tract infections with altered mental status.  She does have a low-grade fever in the ED.  Initial labs show normal WBC and differential, mild hyponatremia which is not felt to be clinically significant.  Lipase is noted to be elevated to 170.  She is not expressing any symptoms and there are no findings suggestive of pancreatitis, but will send for CT of abdomen and pelvis to further evaluate.  Sepsis pathway apparently had not been activated, will place on the evolving sepsis pathway and check blood cultures as well as lactic acid level.  She is given IV fluids.  Urinalysis has not been obtained yet, will obtain by bladder catheterization.  CT scan shows no evidence of pancreatitis.  Lactic acid level is normal.  Urinalysis does show greater than 50 WBCs with few bacteria and negative nitrite.  In the setting of altered mentation, I will treat this as probable urinary tract infection and she is given a dose of ceftriaxone.  She does not meet criteria for sepsis.  Case is discussed with Dr. Bridgett Larsson of [facility], who agrees to admit the  patient.  Final Clinical Impression(s) / ED Diagnoses Final diagnoses:  Urinary tract infection without hematuria, site unspecified  Altered mental status, unspecified altered mental status type  Hyponatremia  Elevated lipase    Rx / DC Orders ED Discharge Orders     None        Delora Fuel, MD 42/35/36 2250

## 2021-04-12 ENCOUNTER — Other Ambulatory Visit: Payer: Self-pay

## 2021-04-12 DIAGNOSIS — G9341 Metabolic encephalopathy: Secondary | ICD-10-CM | POA: Diagnosis not present

## 2021-04-12 DIAGNOSIS — N179 Acute kidney failure, unspecified: Secondary | ICD-10-CM

## 2021-04-12 DIAGNOSIS — N390 Urinary tract infection, site not specified: Secondary | ICD-10-CM | POA: Diagnosis not present

## 2021-04-12 LAB — CBC WITH DIFFERENTIAL/PLATELET
Abs Immature Granulocytes: 0.03 10*3/uL (ref 0.00–0.07)
Basophils Absolute: 0 10*3/uL (ref 0.0–0.1)
Basophils Relative: 0 %
Eosinophils Absolute: 0 10*3/uL (ref 0.0–0.5)
Eosinophils Relative: 0 %
HCT: 36.5 % (ref 36.0–46.0)
Hemoglobin: 11.9 g/dL — ABNORMAL LOW (ref 12.0–15.0)
Immature Granulocytes: 0 %
Lymphocytes Relative: 23 %
Lymphs Abs: 1.7 10*3/uL (ref 0.7–4.0)
MCH: 28.3 pg (ref 26.0–34.0)
MCHC: 32.6 g/dL (ref 30.0–36.0)
MCV: 86.7 fL (ref 80.0–100.0)
Monocytes Absolute: 1.1 10*3/uL — ABNORMAL HIGH (ref 0.1–1.0)
Monocytes Relative: 16 %
Neutro Abs: 4.4 10*3/uL (ref 1.7–7.7)
Neutrophils Relative %: 61 %
Platelets: 258 10*3/uL (ref 150–400)
RBC: 4.21 MIL/uL (ref 3.87–5.11)
RDW: 13.2 % (ref 11.5–15.5)
WBC: 7.3 10*3/uL (ref 4.0–10.5)
nRBC: 0 % (ref 0.0–0.2)

## 2021-04-12 LAB — GLUCOSE, CAPILLARY
Glucose-Capillary: 297 mg/dL — ABNORMAL HIGH (ref 70–99)
Glucose-Capillary: 530 mg/dL (ref 70–99)
Glucose-Capillary: 310 mg/dL — ABNORMAL HIGH (ref 70–99)
Glucose-Capillary: 39 mg/dL — CL (ref 70–99)
Glucose-Capillary: 102 mg/dL — ABNORMAL HIGH (ref 70–99)
Glucose-Capillary: 122 mg/dL — ABNORMAL HIGH (ref 70–99)
Glucose-Capillary: 289 mg/dL — ABNORMAL HIGH (ref 70–99)
Glucose-Capillary: 329 mg/dL — ABNORMAL HIGH (ref 70–99)

## 2021-04-12 LAB — COMPREHENSIVE METABOLIC PANEL
ALT: 24 U/L (ref 0–44)
AST: 27 U/L (ref 15–41)
Albumin: 3.2 g/dL — ABNORMAL LOW (ref 3.5–5.0)
Alkaline Phosphatase: 74 U/L (ref 38–126)
Anion gap: 12 (ref 5–15)
BUN: 29 mg/dL — ABNORMAL HIGH (ref 8–23)
CO2: 27 mmol/L (ref 22–32)
Calcium: 8.6 mg/dL — ABNORMAL LOW (ref 8.9–10.3)
Chloride: 95 mmol/L — ABNORMAL LOW (ref 98–111)
Creatinine, Ser: 1.5 mg/dL — ABNORMAL HIGH (ref 0.44–1.00)
GFR, Estimated: 36 mL/min — ABNORMAL LOW (ref >60)
Glucose, Bld: 276 mg/dL — ABNORMAL HIGH (ref 70–99)
Potassium: 3.8 mmol/L (ref 3.5–5.1)
Sodium: 134 mmol/L — ABNORMAL LOW (ref 135–145)
Total Bilirubin: 0.6 mg/dL (ref 0.3–1.2)
Total Protein: 6.9 g/dL (ref 6.5–8.1)

## 2021-04-12 MED ORDER — INSULIN ASPART 100 UNIT/ML IJ SOLN
0.0000 [IU] | INTRAMUSCULAR | Status: DC
  Administered 2021-04-12: 14:00:00 11 [IU] via SUBCUTANEOUS

## 2021-04-12 MED ORDER — SODIUM CHLORIDE 0.9 % IV SOLN: INTRAVENOUS | Status: DC

## 2021-04-12 MED ORDER — INSULIN ASPART 100 UNIT/ML IJ SOLN
15.0000 [IU] | Freq: Once | INTRAMUSCULAR | Status: AC
  Administered 2021-04-12: 12:00:00 15 [IU] via SUBCUTANEOUS

## 2021-04-12 MED ORDER — SODIUM CHLORIDE 0.9 % IV SOLN: INTRAVENOUS | Status: DC | PRN

## 2021-04-12 MED ORDER — INSULIN ASPART 100 UNIT/ML IJ SOLN
0.0000 [IU] | Freq: Three times a day (TID) | INTRAMUSCULAR | Status: DC
  Administered 2021-04-13: 17:00:00 2 [IU] via SUBCUTANEOUS
  Administered 2021-04-13: 09:00:00 8 [IU] via SUBCUTANEOUS
  Administered 2021-04-13: 12:00:00 5 [IU] via SUBCUTANEOUS
  Administered 2021-04-14: 08:00:00 3 [IU] via SUBCUTANEOUS
  Administered 2021-04-14: 12:00:00 8 [IU] via SUBCUTANEOUS

## 2021-04-12 MED ORDER — INSULIN ASPART 100 UNIT/ML IJ SOLN
0.0000 [IU] | Freq: Every day | INTRAMUSCULAR | Status: DC
  Administered 2021-04-12: 23:00:00 4 [IU] via SUBCUTANEOUS

## 2021-04-12 MED ORDER — INSULIN ASPART 100 UNIT/ML IJ SOLN
0.0000 [IU] | Freq: Three times a day (TID) | INTRAMUSCULAR | Status: DC
  Administered 2021-04-12: 11:00:00 8 [IU] via SUBCUTANEOUS

## 2021-04-12 NOTE — Assessment & Plan Note (Signed)
-   lives at [place] - at baseline now

## 2021-04-12 NOTE — Assessment & Plan Note (Addendum)
-   continue paxlovid. Course to be completed on 12/29 - hold statin; resume 1 week after Paxlovid completed

## 2021-04-12 NOTE — Assessment & Plan Note (Signed)
-   presumed due to UTI - see UTI treatment - this morning she appears back to baseline

## 2021-04-12 NOTE — Assessment & Plan Note (Signed)
Continue Effexor.

## 2021-04-12 NOTE — Assessment & Plan Note (Signed)
-   statin on hold while on paxlovid

## 2021-04-12 NOTE — Progress Notes (Signed)
Pt BG = 42, patient is alert, oriented to self and place, reoriented to time and situation.  OJ x 2 cups and peanut butter sandwich given to patient, tolerated well.  No distress.  Dr. Sabino Gasser made aware, with new orders made.  Will monitor patient closely.

## 2021-04-12 NOTE — Assessment & Plan Note (Addendum)
-   baseline creatinine ~ 0.9 - patient presents with increase in creat >0.3 mg/dL above baseline, creat increase >1.5x baseline presumed to have occurred within past 7 days PTA - creat normalized

## 2021-04-12 NOTE — Assessment & Plan Note (Addendum)
-   Last A1c 9.2% on 02/19/2021 - Continue Semglee

## 2021-04-12 NOTE — Progress Notes (Signed)
BG=102, patient remains alert and oriented x 2.  No complaints offered at this time.  Dr. Sabino Gasser updated on patient's condition.

## 2021-04-12 NOTE — Hospital Course (Addendum)
Wanda Jones is a 75 yo female with PMH DMII, recurrent UTIs, HLD, dementia who presented with worsening confusion.  She resides at [facility] and was noted to be having altered mentation which is common for her in the past when having recurrent UTIs.  She was therefore sent to the hospital for further evaluation.  UA was suggestive of UTI, she was started on Rocephin, and admitted for further work-up and evaluation.  She was incidentally found to be positive for COVID-19. She was started on paxlovid as well.  See below for further A&P.

## 2021-04-12 NOTE — Assessment & Plan Note (Addendum)
-   UA notable for moderate LE, greater than 50 WBC, few bacteria; she was confused on admission with reports that she usually develops this with recurrent UTIs -Urine culture growing Proteus, sensitivities are also back - Has completed 3 days of Rocephin.  Continue 2 more days of Keflex to complete total of 5 days which should be more than sufficient (end date thru 12/29)

## 2021-04-12 NOTE — Assessment & Plan Note (Addendum)
-   Lipase 170 - No evidence of pancreatitis on CT abdomen/pelvis and benign physical exam - lipase downtrending; no further workup or intervention at this time

## 2021-04-12 NOTE — Progress Notes (Signed)
Progress Note    Wanda Jones   [id number]  DOB: 1945/07/09  DOA: 04/10/2021     1 PCP: Wanda Sessions, NP  Initial CC: AMS  Hospital Course: Wanda Jones is a 75 yo female with PMH DMII, recurrent UTIs, HLD, dementia who presented with worsening confusion.  She resides at [facility] and was noted to be having altered mentation which is common for her in the past when having recurrent UTIs.  She was therefore sent to the hospital for further evaluation.  UA was suggestive of UTI, she was started on Rocephin, and admitted for further work-up and evaluation.  She was incidentally found to be positive for COVID-19. She was started on paxlovid as well.   Interval History:  Resting in bed when seen this morning.  She is awake and alert.  Denies any abdominal pain.  Mentation seems improved compared to described on admission.  Assessment & Plan: * Acute lower UTI (urinary tract infection)- (present on admission) - UA notable for moderate LE, greater than 50 WBC, few bacteria; she was confused on admission with reports that she usually develops this with recurrent UTIs - Continue Rocephin - Follow-up urine culture  Acute metabolic encephalopathy- (present on admission) - presumed due to UTI - see UTI treatment - this morning she appears back to baseline  Incidental finding of COVID-19 virus infection- (present on admission) - continue paxlovid - hold statin   AKI (acute kidney injury) ([place])- (present on admission) - baseline creatinine ~ 0.9 - patient presents with increase in creat >0.3 mg/dL above baseline, creat increase >1.5x baseline presumed to have occurred within past 7 days PTA - creat 1.5 today - continue IVF - BMP in am    Elevated lipase- (present on admission) - Lipase 170 - No evidence of pancreatitis on CT abdomen/pelvis and benign physical exam - Repeat lipase in a.m.  No further work-up at this time  Type 2 diabetes mellitus with hyperglycemia  ([place])- (present on admission) - Last A1c 9.2% on 02/19/2021 - Continue SSI and CBG monitoring - Continue Semglee - CBG currently uncontrolled, will use modified regimen for now until controlled  Depression- (present on admission) - Continue Effexor  Sleep apnea- (present on admission) - Not on CPAP  Dementia ([place])- (present on admission) - lives at [place] - at baseline now   Hyperlipidemia- (present on admission) - statin on hold while on paxlovid     Old records reviewed in assessment of this patient  Antimicrobials: Rocephin 12/25 >> current  DVT prophylaxis: Lovenox  Code Status:   Code Status: Full Code  Disposition Plan:  [facility] Status is: Inpt  Objective: Blood pressure 136/62, pulse 99, temperature 98.4 F (36.9 C), temperature source Oral, resp. rate 18, height 5' 3.5" (1.613 m), weight 58.8 kg, SpO2 99 %.  Examination:  Physical Exam Constitutional:      Appearance: Normal appearance.  HENT:     Head: Normocephalic and atraumatic.     Mouth/Throat:     Mouth: Mucous membranes are moist.  Eyes:     Extraocular Movements: Extraocular movements intact.  Cardiovascular:     Rate and Rhythm: Normal rate and regular rhythm.  Pulmonary:     Effort: Pulmonary effort is normal.     Breath sounds: Normal breath sounds.  Abdominal:     General: Bowel sounds are normal. There is no distension.     Palpations: Abdomen is soft.     Tenderness: There is no abdominal tenderness.  Musculoskeletal:  General: Normal range of motion.     Cervical back: Normal range of motion and neck supple.  Skin:    General: Skin is warm and dry.  Neurological:     General: No focal deficit present.     Mental Status: She is alert.  Psychiatric:        Mood and Affect: Mood normal.        Behavior: Behavior normal.     Consultants:    Procedures:    Data Reviewed: I have personally reviewed labs and imaging studies    LOS: 1 day  Time spent: Greater  than 50% of the 35 minute visit was spent in counseling/coordination of care for the patient as laid out in the A&P.   Dwyane Dee, MD [facility] 04/12/2021, 12:06 PM

## 2021-04-13 DIAGNOSIS — E1165 Type 2 diabetes mellitus with hyperglycemia: Secondary | ICD-10-CM | POA: Diagnosis not present

## 2021-04-13 DIAGNOSIS — Z794 Long term (current) use of insulin: Secondary | ICD-10-CM

## 2021-04-13 DIAGNOSIS — N179 Acute kidney failure, unspecified: Secondary | ICD-10-CM | POA: Diagnosis not present

## 2021-04-13 DIAGNOSIS — N390 Urinary tract infection, site not specified: Secondary | ICD-10-CM | POA: Diagnosis not present

## 2021-04-13 LAB — CBC WITH DIFFERENTIAL/PLATELET
Abs Immature Granulocytes: 0.04 10*3/uL (ref 0.00–0.07)
Basophils Absolute: 0 10*3/uL (ref 0.0–0.1)
Basophils Relative: 0 %
Eosinophils Absolute: 0 10*3/uL (ref 0.0–0.5)
Eosinophils Relative: 0 %
HCT: 35 % — ABNORMAL LOW (ref 36.0–46.0)
Hemoglobin: 11.5 g/dL — ABNORMAL LOW (ref 12.0–15.0)
Immature Granulocytes: 0 %
Lymphocytes Relative: 18 %
Lymphs Abs: 1.7 10*3/uL (ref 0.7–4.0)
MCH: 28.2 pg (ref 26.0–34.0)
MCHC: 32.9 g/dL (ref 30.0–36.0)
MCV: 85.8 fL (ref 80.0–100.0)
Monocytes Absolute: 0.7 10*3/uL (ref 0.1–1.0)
Monocytes Relative: 7 %
Neutro Abs: 7.1 10*3/uL (ref 1.7–7.7)
Neutrophils Relative %: 75 %
Platelets: 272 10*3/uL (ref 150–400)
RBC: 4.08 MIL/uL (ref 3.87–5.11)
RDW: 13.2 % (ref 11.5–15.5)
WBC: 9.6 10*3/uL (ref 4.0–10.5)
nRBC: 0 % (ref 0.0–0.2)

## 2021-04-13 LAB — COMPREHENSIVE METABOLIC PANEL
ALT: 23 U/L (ref 0–44)
AST: 23 U/L (ref 15–41)
Albumin: 3 g/dL — ABNORMAL LOW (ref 3.5–5.0)
Alkaline Phosphatase: 65 U/L (ref 38–126)
Anion gap: 11 (ref 5–15)
BUN: 19 mg/dL (ref 8–23)
CO2: 23 mmol/L (ref 22–32)
Calcium: 8.1 mg/dL — ABNORMAL LOW (ref 8.9–10.3)
Chloride: 96 mmol/L — ABNORMAL LOW (ref 98–111)
Creatinine, Ser: 1.07 mg/dL — ABNORMAL HIGH (ref 0.44–1.00)
GFR, Estimated: 54 mL/min — ABNORMAL LOW (ref >60)
Glucose, Bld: 278 mg/dL — ABNORMAL HIGH (ref 70–99)
Potassium: 3.8 mmol/L (ref 3.5–5.1)
Sodium: 130 mmol/L — ABNORMAL LOW (ref 135–145)
Total Bilirubin: 0.8 mg/dL (ref 0.3–1.2)
Total Protein: 6.6 g/dL (ref 6.5–8.1)

## 2021-04-13 LAB — GLUCOSE, CAPILLARY
Glucose-Capillary: 282 mg/dL — ABNORMAL HIGH (ref 70–99)
Glucose-Capillary: 228 mg/dL — ABNORMAL HIGH (ref 70–99)
Glucose-Capillary: 143 mg/dL — ABNORMAL HIGH (ref 70–99)
Glucose-Capillary: 108 mg/dL — ABNORMAL HIGH (ref 70–99)

## 2021-04-13 LAB — LIPASE, BLOOD: Lipase: 82 U/L — ABNORMAL HIGH (ref 11–51)

## 2021-04-13 LAB — URINE CULTURE: Culture: >=100000 — AB

## 2021-04-13 LAB — C-REACTIVE PROTEIN: CRP: 5.6 mg/dL — ABNORMAL HIGH (ref <1.0)

## 2021-04-13 LAB — LACTATE DEHYDROGENASE: LDH: 149 U/L (ref 98–192)

## 2021-04-13 LAB — D-DIMER, QUANTITATIVE: D-Dimer, Quant: 1.19 ug/mL-FEU — ABNORMAL HIGH (ref 0.00–0.50)

## 2021-04-13 LAB — MAGNESIUM: Magnesium: 1.8 mg/dL (ref 1.7–2.4)

## 2021-04-13 MED ORDER — CEPHALEXIN 500 MG PO CAPS
500.0000 mg | ORAL_CAPSULE | Freq: Two times a day (BID) | ORAL | Status: DC
  Administered 2021-04-14: 10:00:00 500 mg via ORAL
  Filled 2021-04-13: qty 1

## 2021-04-13 NOTE — Progress Notes (Signed)
Progress Note    Wanda Jones   [id number]  DOB: 1946-01-02  DOA: 04/10/2021     2 PCP: Roetta Sessions, NP  Initial CC: AMS  Hospital Course: [name] is a 75 yo female with PMH DMII, recurrent UTIs, HLD, dementia who presented with worsening confusion.  She resides at [facility] and was noted to be having altered mentation which is common for her in the past when having recurrent UTIs.  She was therefore sent to the hospital for further evaluation.  UA was suggestive of UTI, she was started on Rocephin, and admitted for further work-up and evaluation.  She was incidentally found to be positive for COVID-19. She was started on paxlovid as well.   Interval History:  No events overnight.  Resting in bed comfortably this morning.  She had no concerns and overall appears to be improved.  Tentative plan is for discharging back to [facility].  Assessment & Plan: * Acute lower UTI (urinary tract infection)- (present on admission) - UA notable for moderate LE, greater than 50 WBC, few bacteria; she was confused on admission with reports that she usually develops this with recurrent UTIs -Urine culture growing Proteus, sensitivities are also back - Has completed 3 days of Rocephin.  Continue 2 more days of Keflex to complete total of 5 days which should be more than sufficient  Acute metabolic encephalopathy- (present on admission) - presumed due to UTI - see UTI treatment - this morning she appears back to baseline  Incidental finding of COVID-19 virus infection- (present on admission) - continue paxlovid - hold statin   AKI (acute kidney injury) ([place])- (present on admission) - baseline creatinine ~ 0.9 - patient presents with increase in creat >0.3 mg/dL above baseline, creat increase >1.5x baseline presumed to have occurred within past 7 days PTA - creat improving with IVF - continue IVF - BMP in am    Elevated lipase- (present on admission) - Lipase  170 - No evidence of pancreatitis on CT abdomen/pelvis and benign physical exam - Repeat lipase in a.m.  No further work-up at this time  Type 2 diabetes mellitus with hyperglycemia ([place])- (present on admission) - Last A1c 9.2% on 02/19/2021 - Continue SSI and CBG monitoring - Continue Semglee - CBG currently uncontrolled, will use modified regimen for now until controlled  Depression- (present on admission) - Continue Effexor  Sleep apnea- (present on admission) - Not on CPAP  Dementia ([place])- (present on admission) - lives at [place] - at baseline now   Hyperlipidemia- (present on admission) - statin on hold while on paxlovid     Old records reviewed in assessment of this patient  Antimicrobials: Rocephin 12/25 >> 04/13/2021 Keflex 04/13/2021 >> current  DVT prophylaxis: Lovenox  Code Status:   Code Status: Full Code  Disposition Plan:  [place] [place] Status is: Inpt  Objective: Blood pressure (!) 119/56, pulse 90, temperature 99.3 F (37.4 C), temperature source Oral, resp. rate 18, height 5' 3.5" (1.613 m), weight 58.8 kg, SpO2 94 %.  Examination:  Physical Exam Constitutional:      Appearance: Normal appearance.  HENT:     Head: Normocephalic and atraumatic.     Mouth/Throat:     Mouth: Mucous membranes are moist.  Eyes:     Extraocular Movements: Extraocular movements intact.  Cardiovascular:     Rate and Rhythm: Normal rate and regular rhythm.  Pulmonary:     Effort: Pulmonary effort is normal.     Breath sounds:  Normal breath sounds.  Abdominal:     General: Bowel sounds are normal. There is no distension.     Palpations: Abdomen is soft.     Tenderness: There is no abdominal tenderness.  Musculoskeletal:        General: Normal range of motion.     Cervical back: Normal range of motion and neck supple.  Skin:    General: Skin is warm and dry.  Neurological:     General: No focal deficit present.     Mental Status: She is alert.  Psychiatric:         Mood and Affect: Mood normal.        Behavior: Behavior normal.     Consultants:    Procedures:    Data Reviewed: I have personally reviewed labs and imaging studies    LOS: 2 days  Time spent: Greater than 50% of the 35 minute visit was spent in counseling/coordination of care for the patient as laid out in the A&P.   Dwyane Dee, MD [facility] 04/13/2021, 3:17 PM

## 2021-04-13 NOTE — TOC Progression Note (Signed)
Transition of Care [facility]) - Progression Note    Patient Details  Name: Marlaya Turck MRN: [id number] Date of Birth: [birth year]  Transition of [facility] CM/SW Contact  Leeroy Cha, RN Phone Number: 04/13/2021, 10:10 AM  Clinical Narrative:    Skip Estimable for Brion Aliment to call me back about if patient has to stay in hospital for 10 days before [facility].        Expected Discharge Plan and Services                                                 Social Determinants of Health (SDOH) Interventions    Readmission Risk Interventions Readmission Risk Prevention Plan 01/04/2021  Post Dischage Appt Complete  Medication Screening Complete  Transportation Screening Complete

## 2021-04-13 NOTE — Progress Notes (Signed)
Inpatient Diabetes Program Recommendations  AACE/ADA: New Consensus Statement on Inpatient Glycemic Control (2015)  Target Ranges:  Prepandial:   less than 140 mg/dL      Peak postprandial:   less than 180 mg/dL (1-2 hours)      Critically ill patients:  140 - 180 mg/dL   Lab Results  Component Value Date   GLUCAP 228 (H) 04/13/2021   HGBA1C 9.2 (A) 02/19/2021    Review of Glycemic Control  Diabetes history: DM1 Outpatient Diabetes medications: Tresiba 8 units QD, Novolog 6-5-5 units TID with meals,  Current orders for Inpatient glycemic control: Semglee 8 units QD, Novolog 0-15 units TID with meals and 0-5 HS  Inpatient Diabetes Program Recommendations:    Consider addition of Novolog 4 units TID with meals if eating > 50% meal  Will follow glucose trends.   Thank you. Lorenda Peck, RD, LDN, CDE Inpatient Diabetes Coordinator [phone]

## 2021-04-14 DIAGNOSIS — U071 COVID-19: Secondary | ICD-10-CM

## 2021-04-14 DIAGNOSIS — N390 Urinary tract infection, site not specified: Secondary | ICD-10-CM | POA: Diagnosis not present

## 2021-04-14 DIAGNOSIS — N179 Acute kidney failure, unspecified: Secondary | ICD-10-CM | POA: Diagnosis not present

## 2021-04-14 DIAGNOSIS — G9341 Metabolic encephalopathy: Secondary | ICD-10-CM | POA: Diagnosis not present

## 2021-04-14 LAB — COMPREHENSIVE METABOLIC PANEL
ALT: 17 U/L (ref 0–44)
AST: 18 U/L (ref 15–41)
Albumin: 2.7 g/dL — ABNORMAL LOW (ref 3.5–5.0)
Alkaline Phosphatase: 62 U/L (ref 38–126)
Anion gap: 7 (ref 5–15)
BUN: 15 mg/dL (ref 8–23)
CO2: 26 mmol/L (ref 22–32)
Calcium: 8.4 mg/dL — ABNORMAL LOW (ref 8.9–10.3)
Chloride: 105 mmol/L (ref 98–111)
Creatinine, Ser: 0.84 mg/dL (ref 0.44–1.00)
GFR, Estimated: >60 mL/min (ref >60)
Glucose, Bld: 161 mg/dL — ABNORMAL HIGH (ref 70–99)
Potassium: 3.9 mmol/L (ref 3.5–5.1)
Sodium: 138 mmol/L (ref 135–145)
Total Bilirubin: 0.5 mg/dL (ref 0.3–1.2)
Total Protein: 6.4 g/dL — ABNORMAL LOW (ref 6.5–8.1)

## 2021-04-14 LAB — CBC WITH DIFFERENTIAL/PLATELET
Abs Immature Granulocytes: 0.01 10*3/uL (ref 0.00–0.07)
Basophils Absolute: 0 10*3/uL (ref 0.0–0.1)
Basophils Relative: 0 %
Eosinophils Absolute: 0 10*3/uL (ref 0.0–0.5)
Eosinophils Relative: 0 %
HCT: 34.5 % — ABNORMAL LOW (ref 36.0–46.0)
Hemoglobin: 11.4 g/dL — ABNORMAL LOW (ref 12.0–15.0)
Immature Granulocytes: 0 %
Lymphocytes Relative: 28 %
Lymphs Abs: 1.9 10*3/uL (ref 0.7–4.0)
MCH: 28.4 pg (ref 26.0–34.0)
MCHC: 33 g/dL (ref 30.0–36.0)
MCV: 86 fL (ref 80.0–100.0)
Monocytes Absolute: 0.6 10*3/uL (ref 0.1–1.0)
Monocytes Relative: 8 %
Neutro Abs: 4.3 10*3/uL (ref 1.7–7.7)
Neutrophils Relative %: 64 %
Platelets: 225 10*3/uL (ref 150–400)
RBC: 4.01 MIL/uL (ref 3.87–5.11)
RDW: 13.4 % (ref 11.5–15.5)
WBC: 6.8 10*3/uL (ref 4.0–10.5)
nRBC: 0 % (ref 0.0–0.2)

## 2021-04-14 LAB — GLUCOSE, CAPILLARY
Glucose-Capillary: 161 mg/dL — ABNORMAL HIGH (ref 70–99)
Glucose-Capillary: 262 mg/dL — ABNORMAL HIGH (ref 70–99)

## 2021-04-14 LAB — C-REACTIVE PROTEIN: CRP: 11.5 mg/dL — ABNORMAL HIGH (ref <1.0)

## 2021-04-14 LAB — MAGNESIUM: Magnesium: 1.9 mg/dL (ref 1.7–2.4)

## 2021-04-14 LAB — D-DIMER, QUANTITATIVE: D-Dimer, Quant: 0.82 ug/mL-FEU — ABNORMAL HIGH (ref 0.00–0.50)

## 2021-04-14 LAB — LACTATE DEHYDROGENASE: LDH: 122 U/L (ref 98–192)

## 2021-04-14 MED ORDER — ROSUVASTATIN CALCIUM 5 MG PO TABS: 5.0000 mg | ORAL_TABLET | Freq: Every day | ORAL | Status: AC

## 2021-04-14 MED ORDER — CEPHALEXIN 500 MG PO CAPS: 500.0000 mg | ORAL_CAPSULE | Freq: Two times a day (BID) | ORAL | 0 refills | Status: AC

## 2021-04-14 MED ORDER — CEPHALEXIN 500 MG PO CAPS: 500.0000 mg | ORAL_CAPSULE | Freq: Two times a day (BID) | ORAL | 0 refills | Status: DC

## 2021-04-14 MED ORDER — NIRMATRELVIR/RITONAVIR (PAXLOVID)TABLET: 3.0000 | ORAL_TABLET | Freq: Two times a day (BID) | ORAL | 0 refills | Status: AC

## 2021-04-14 NOTE — Discharge Summary (Signed)
Physician Discharge Summary   Wanda Jones ELF:[id number] DOB: [birth year] DOA: 04/10/2021  PCP: Roetta Sessions, NP  Admit date: 04/10/2021 Discharge date:  04/14/2021  [facility] Disposition:  [facility] Discharging physician: Dwyane Dee, MD  Recommendations for Outpatient Follow-up:  Resume crestor 1 week after Paxlovid completed   Discharge Condition: Stable CODE STATUS: Full Diet recommendation:  Diet Orders (From admission, onward)     Start     Ordered   04/14/21 0000  Diet - low sodium heart healthy        04/14/21 0909   04/11/21 0741  Diet heart healthy/carb modified Room service appropriate? Yes; Fluid consistency: Thin  Diet effective now       Question Answer Comment  Diet-HS Snack? Nothing   Room service appropriate? Yes   Fluid consistency: Thin      04/11/21 0741            Hospital Course: Wanda Jones is a 75 yo female with PMH DMII, recurrent UTIs, HLD, dementia who presented with worsening confusion.  She resides at [facility] and was noted to be having altered mentation which is common for her in the past when having recurrent UTIs.  She was therefore sent to the hospital for further evaluation.  UA was suggestive of UTI, she was started on Rocephin, and admitted for further work-up and evaluation.  She was incidentally found to be positive for COVID-19. She was started on paxlovid as well.  See below for further A&P.   * Acute lower UTI (urinary tract infection)- (present on admission) - UA notable for moderate LE, greater than 50 WBC, few bacteria; she was confused on admission with reports that she usually develops this with recurrent UTIs -Urine culture growing Proteus, sensitivities are also back - Has completed 3 days of Rocephin.  Continue 2 more days of Keflex to complete total of 5 days which should be more than sufficient (end date thru 12/29)  Incidental finding of COVID-19 virus infection- (present on  admission) - continue paxlovid. Course to be completed on 12/29 - hold statin; resume 1 week after Paxlovid completed  Acute metabolic encephalopathy-resolved as of 04/14/2021, (present on admission) - presumed due to UTI - see UTI treatment - this morning she appears back to baseline  AKI (acute kidney injury) (HCC)-resolved as of 04/14/2021, (present on admission) - baseline creatinine ~ 0.9 - patient presents with increase in creat >0.3 mg/dL above baseline, creat increase >1.5x baseline presumed to have occurred within past 7 days PTA - creat normalized    Elevated lipase- (present on admission) - Lipase 170 - No evidence of pancreatitis on CT abdomen/pelvis and benign physical exam - lipase downtrending; no further workup or intervention at this time  Type 2 diabetes mellitus with hyperglycemia ([place])- (present on admission) - Last A1c 9.2% on 02/19/2021 - Continue Semglee   Depression- (present on admission) - Continue Effexor  Sleep apnea- (present on admission) - Not on CPAP  Dementia ([place])- (present on admission) - lives at [facility] - at baseline now   Hyperlipidemia- (present on admission) - statin on hold while on paxlovid     Principal Diagnosis: Acute lower UTI (urinary tract infection)  Discharge Diagnoses: Principal Problem:   Acute lower UTI (urinary tract infection) Active Problems:   Incidental finding of COVID-19 virus infection   Elevated lipase   Hyperlipidemia   Dementia (HCC)   Sleep apnea   Depression   Type 2 diabetes mellitus with hyperglycemia ([place])  Discharge Instructions     Diet - low sodium heart healthy   Complete by: As directed    Increase activity slowly   Complete by: As directed       Allergies as of 04/14/2021       Reactions   Codeine Anaphylaxis   Prednisone Anaphylaxis        Medication List     TAKE these medications    acetaminophen 325 MG tablet Commonly known as: TYLENOL Take 325 mg by mouth  every 6 (six) hours as needed for moderate pain or headache.   cephALEXin 500 MG capsule Commonly known as: KEFLEX Take 1 capsule (500 mg total) by mouth every 12 (twelve) hours for 2 days.   cetirizine 10 MG tablet Commonly known as: ZYRTEC Take 10 mg by mouth daily as needed for allergies.   clobetasol 0.05 % external solution Commonly known as: TEMOVATE Apply 1 application topically 2 (two) times daily as needed (irritation).   donepezil 5 MG tablet Commonly known as: ARICEPT Take 5 mg by mouth at bedtime.   FreeStyle [place] 2 Reader [place] Use as directed. E10.69   levothyroxine 88 MCG tablet Commonly known as: SYNTHROID Take 88 mcg by mouth daily before breakfast.   memantine 5 MG tablet Commonly known as: NAMENDA Take 5 mg by mouth 2 (two) times daily.   Multivitamin Adults Tabs Take 1 tablet by mouth daily.   Myrbetriq 50 MG Tb24 tablet Generic drug: mirabegron ER Take 50 mg by mouth daily.   nirmatrelvir/ritonavir EUA 20 x 150 MG & 10 x 100MG  Tabs Commonly known as: PAXLOVID Take 3 tablets by mouth 2 (two) times daily for 1 day. Patient GFR is >60. Take nirmatrelvir (150 mg) two tablets twice daily for 5 days and ritonavir (100 mg) one tablet twice daily for 5 days.   NovoLOG FlexPen 100 UNIT/ML FlexPen Generic drug: insulin aspart 3 times a day (just before each meal), 6-5-5 units. What changed:  how much to take when to take this additional instructions   Ozempic (0.25 or 0.5 MG/DOSE) 2 MG/1.5ML Sopn Generic drug: Semaglutide(0.25 or 0.5MG /DOS) Inject 0.5 mg into the skin once a week.   rosuvastatin 5 MG tablet Commonly known as: CRESTOR Take 1 tablet (5 mg total) by mouth at bedtime. Start taking on: April 22, 2021 What changed: These instructions start on April 22, 2021. If you are unsure what to do until then, ask your doctor or other care provider.   [name] FlexTouch 100 UNIT/ML FlexTouch Pen Generic drug: insulin degludec Inject 8 Units into  the skin daily.   venlafaxine XR 150 MG 24 hr capsule Commonly known as: EFFEXOR-XR Take 150 mg by mouth daily with breakfast.   vitamin B-12 1000 MCG tablet Commonly known as: CYANOCOBALAMIN Take 1,000 mcg by mouth daily.        Allergies  Allergen Reactions   Codeine Anaphylaxis   Prednisone Anaphylaxis    Consultations:   Discharge Exam: BP (!) 122/55 (BP Location: Left Arm)    Pulse 79    Temp 97.7 F (36.5 C) (Axillary)    Resp (!) 25    Ht 5' 3.5" (1.613 m)    Wt 58.8 kg    SpO2 99%    BMI 22.60 kg/m  Physical Exam Constitutional:      Appearance: Normal appearance.  HENT:     Head: Normocephalic and atraumatic.     Mouth/Throat:     Mouth: Mucous membranes are moist.  Eyes:  Extraocular Movements: Extraocular movements intact.  Cardiovascular:     Rate and Rhythm: Normal rate and regular rhythm.  Pulmonary:     Effort: Pulmonary effort is normal.     Breath sounds: Normal breath sounds.  Abdominal:     General: Bowel sounds are normal. There is no distension.     Palpations: Abdomen is soft.     Tenderness: There is no abdominal tenderness.  Musculoskeletal:        General: Normal range of motion.     Cervical back: Normal range of motion and neck supple.  Skin:    General: Skin is warm and dry.  Neurological:     General: No focal deficit present.     Mental Status: She is alert.  Psychiatric:        Mood and Affect: Mood normal.        Behavior: Behavior normal.     The results of significant diagnostics from this hospitalization (including imaging, microbiology, ancillary and laboratory) are listed below for reference.   Microbiology: Recent Results (from the past 240 hour(s))  Resp Panel by RT-PCR (Flu A&B, Covid) Nasopharyngeal Swab     Status: Abnormal   Collection Time: 04/11/21  2:33 AM   Specimen: Nasopharyngeal Swab; Nasopharyngeal(NP) swabs in vial transport medium  Result Value Ref Range Status   SARS Coronavirus 2 by RT PCR  POSITIVE (A) NEGATIVE Final    Comment: (NOTE) SARS-CoV-2 target nucleic acids are DETECTED.  The SARS-CoV-2 RNA is generally detectable in upper respiratory specimens during the acute phase of infection. Positive results are indicative of the presence of the identified virus, but do not rule out bacterial infection or co-infection with other pathogens not detected by the test. Clinical correlation with patient history and other diagnostic information is necessary to determine patient infection status. The expected result is Negative.  Fact Sheet for Patients: EntrepreneurPulse.com.au  Fact Sheet for Healthcare Providers: IncredibleEmployment.be  This test is not yet approved or cleared by the [place] FDA and  has been authorized for detection and/or diagnosis of SARS-CoV-2 by FDA under an Emergency Use Authorization (EUA).  This EUA will remain in effect (meaning this test can be used) for the duration of  the COVID-19 declaration under Section 564(b)(1) of the A ct, 21 U.S.C. section 360bbb-3(b)(1), unless the authorization is terminated or revoked sooner.     Influenza A by PCR NEGATIVE NEGATIVE Final   Influenza B by PCR NEGATIVE NEGATIVE Final    Comment: (NOTE) The Xpert Xpress SARS-CoV-2/FLU/RSV plus assay is intended as an aid in the diagnosis of influenza from Nasopharyngeal swab specimens and should not be used as a sole basis for treatment. Nasal washings and aspirates are unacceptable for Xpert Xpress SARS-CoV-2/FLU/RSV testing.  Fact Sheet for Patients: EntrepreneurPulse.com.au  Fact Sheet for Healthcare Providers: IncredibleEmployment.be  This test is not yet approved or cleared by the [place] FDA and has been authorized for detection and/or diagnosis of SARS-CoV-2 by FDA under an Emergency Use Authorization (EUA). This EUA will remain in effect (meaning this test can be used)  for the duration of the COVID-19 declaration under Section 564(b)(1) of the Act, 21 U.S.C. section 360bbb-3(b)(1), unless the authorization is terminated or revoked.  Performed at [facility], [place] [address], [place], [place] [address]   Blood Culture (routine x 2)     Status: None (Preliminary result)   Collection Time: 04/11/21  2:33 AM   Specimen: BLOOD  Result Value Ref Range Status  Specimen Description   Final    BLOOD RIGHT HAND Performed at [facility] [address], [place], [place] [address]    Special Requests   Final    BOTTLES DRAWN AEROBIC AND ANAEROBIC Blood Culture adequate volume Performed at [facility] [address], [place], [place] [address]    Culture   Final    NO GROWTH 3 DAYS Performed at [facility], [place] [address], [place], [place] [address]    Report Status PENDING  Incomplete  Blood Culture (routine x 2)     Status: None (Preliminary result)   Collection Time: 04/11/21  2:33 AM   Specimen: BLOOD  Result Value Ref Range Status   Specimen Description   Final    BLOOD LEFT HAND Performed at [facility] [address] [place], [place] [address]    Special Requests   Final    BOTTLES DRAWN AEROBIC AND ANAEROBIC Blood Culture adequate volume Performed at [facility] [address] [place], [place] [address]    Culture   Final    NO GROWTH 3 DAYS Performed at [facility], [place] [address], [place], [place] [address]    Report Status PENDING  Incomplete  Urine Culture     Status: Abnormal   Collection Time: 04/11/21  3:22 AM   Specimen: Urine, Clean Catch  Result Value Ref Range Status   Specimen Description   Final    URINE, CLEAN CATCH Performed at [facility], [place] [address] [place], [place] [address]    Special Requests   Final    NONE Performed at [facility], [place] [address] [place], [place] [address]    Culture >=100,000 COLONIES/mL PROTEUS MIRABILIS (A)  Final   Report Status 04/13/2021 FINAL  Final   Organism ID, Bacteria PROTEUS MIRABILIS (A)  Final      Susceptibility   Proteus mirabilis - MIC*    AMPICILLIN <=2 SENSITIVE Sensitive     CEFAZOLIN <=4 SENSITIVE Sensitive     CEFEPIME <=0.12 SENSITIVE Sensitive     CEFTRIAXONE <=0.25 SENSITIVE Sensitive     CIPROFLOXACIN <=0.25 SENSITIVE Sensitive     GENTAMICIN <=1 SENSITIVE Sensitive     IMIPENEM 2 SENSITIVE Sensitive     NITROFURANTOIN 128 RESISTANT Resistant     TRIMETH/SULFA <=20 SENSITIVE Sensitive     AMPICILLIN/SULBACTAM <=2 SENSITIVE Sensitive     PIP/TAZO <=4 SENSITIVE Sensitive     * >=100,000 COLONIES/mL PROTEUS MIRABILIS     Labs: BNP (last 3 results) Recent Labs    09/29/20 1833  BNP 144.3*   Basic Metabolic Panel: Recent Labs  Lab 04/10/21 2158 04/12/21 0509 04/13/21 0507 04/14/21 0510  NA 131* 134* 130* 138  K 3.9 3.8 3.8 3.9  CL 97* 95* 96* 105  CO2 23 27 23 26   GLUCOSE 269* 276* 278* 161*  BUN 21 29* 19 15  CREATININE 0.90 1.50* 1.07* 0.84  CALCIUM 9.0 8.6* 8.1* 8.4*  MG  --   --  1.8 1.9   Liver Function Tests: Recent Labs  Lab 04/10/21 2158 04/12/21 0509 04/13/21 0507 04/14/21 0510  AST 23 27 23 18   ALT 21 24 23 17   ALKPHOS 86 74 65 62  BILITOT 0.7 0.6 0.8 0.5  PROT 7.8 6.9 6.6 6.4*  ALBUMIN 3.6 3.2* 3.0* 2.7*   Recent Labs  Lab 04/10/21 2158 04/13/21 0507  LIPASE 170* 82*   No  results for input(s): AMMONIA in the last 168 hours. CBC: Recent Labs  Lab 04/10/21 2158 04/12/21 0509 04/13/21 0507 04/14/21 0510  WBC 8.1 7.3 9.6 6.8  NEUTROABS 6.3 4.4 7.1 4.3  HGB 12.2 11.9* 11.5* 11.4*  HCT 36.9 36.5 35.0* 34.5*  MCV 86.0 86.7 85.8 86.0  PLT 276 258 272 225   Cardiac Enzymes: No results for input(s): CKTOTAL, CKMB, CKMBINDEX, TROPONINI in the last 168 hours. BNP: Invalid input(s): POCBNP CBG: Recent Labs  Lab 04/13/21 0800  04/13/21 1157 04/13/21 1720 04/13/21 2033 04/14/21 0743  GLUCAP 282* 228* 143* 108* 161*   D-Dimer Recent Labs    04/13/21 0507 04/14/21 0510  DDIMER 1.19* 0.82*   Hgb A1c No results for input(s): HGBA1C in the last 72 hours. Lipid Profile No results for input(s): CHOL, HDL, LDLCALC, TRIG, CHOLHDL, LDLDIRECT in the last 72 hours. Thyroid function studies No results for input(s): TSH, T4TOTAL, T3FREE, THYROIDAB in the last 72 hours.  Invalid input(s): FREET3 Anemia work up No results for input(s): VITAMINB12, FOLATE, FERRITIN, TIBC, IRON, RETICCTPCT in the last 72 hours. Urinalysis    Component Value Date/Time   COLORURINE YELLOW (A) 04/11/2021 0322   APPEARANCEUR HAZY (A) 04/11/2021 0322   LABSPEC 1.015 04/11/2021 0322   PHURINE 8.0 04/11/2021 0322   GLUCOSEU >1,000 (A) 04/11/2021 0322   HGBUR TRACE (A) 04/11/2021 0322   BILIRUBINUR NEGATIVE 04/11/2021 0322   KETONESUR 15 (A) 04/11/2021 0322   PROTEINUR 30 (A) 04/11/2021 0322   NITRITE NEGATIVE 04/11/2021 0322   LEUKOCYTESUR MODERATE (A) 04/11/2021 0322   Sepsis Labs Invalid input(s): PROCALCITONIN,  WBC,  LACTICIDVEN Microbiology Recent Results (from the past 240 hour(s))  Resp Panel by RT-PCR (Flu A&B, Covid) Nasopharyngeal Swab     Status: Abnormal   Collection Time: 04/11/21  2:33 AM   Specimen: Nasopharyngeal Swab; Nasopharyngeal(NP) swabs in vial transport medium  Result Value Ref Range Status   SARS Coronavirus 2 by RT PCR POSITIVE (A) NEGATIVE Final    Comment: (NOTE) SARS-CoV-2 target nucleic acids are DETECTED.  The SARS-CoV-2 RNA is generally detectable in upper respiratory specimens during the acute phase of infection. Positive results are indicative of the presence of the identified virus, but do not rule out bacterial infection or co-infection with other pathogens not detected by the test. Clinical correlation with patient history and other diagnostic information is necessary to determine  patient infection status. The expected result is Negative.  Fact Sheet for Patients: EntrepreneurPulse.com.au  Fact Sheet for Healthcare Providers: IncredibleEmployment.be  This test is not yet approved or cleared by the [place] FDA and  has been authorized for detection and/or diagnosis of SARS-CoV-2 by FDA under an Emergency Use Authorization (EUA).  This EUA will remain in effect (meaning this test can be used) for the duration of  the COVID-19 declaration under Section 564(b)(1) of the A ct, 21 U.S.C. section 360bbb-3(b)(1), unless the authorization is terminated or revoked sooner.     Influenza A by PCR NEGATIVE NEGATIVE Final   Influenza B by PCR NEGATIVE NEGATIVE Final    Comment: (NOTE) The Xpert Xpress SARS-CoV-2/FLU/RSV plus assay is intended as an aid in the diagnosis of influenza from Nasopharyngeal swab specimens and should not be used as a sole basis for treatment. Nasal washings and aspirates are unacceptable for Xpert Xpress SARS-CoV-2/FLU/RSV testing.  Fact Sheet for Patients: EntrepreneurPulse.com.au  Fact Sheet for Healthcare Providers: IncredibleEmployment.be  This test is not yet approved or cleared by the [place] FDA and has been  authorized for detection and/or diagnosis of SARS-CoV-2 by FDA under an Emergency Use Authorization (EUA). This EUA will remain in effect (meaning this test can be used) for the duration of the COVID-19 declaration under Section 564(b)(1) of the Act, 21 U.S.C. section 360bbb-3(b)(1), unless the authorization is terminated or revoked.  Performed at [facility], [place] [address], [place], [place] [address]   Blood Culture (routine x 2)     Status: None (Preliminary result)   Collection Time: 04/11/21  2:33 AM   Specimen: BLOOD  Result Value Ref Range Status   Specimen Description   Final    BLOOD RIGHT HAND Performed at  [facility] [address] [place], [place] [address]    Special Requests   Final    BOTTLES DRAWN AEROBIC AND ANAEROBIC Blood Culture adequate volume Performed at [facility] [address], [place], [place] [address]    Culture   Final    NO GROWTH 3 DAYS Performed at [facility], [place] [address], [place], [place] [address]    Report Status PENDING  Incomplete  Blood Culture (routine x 2)     Status: None (Preliminary result)   Collection Time: 04/11/21  2:33 AM   Specimen: BLOOD  Result Value Ref Range Status   Specimen Description   Final    BLOOD LEFT HAND Performed at [facility] [address] [place], [place] [address]    Special Requests   Final    BOTTLES DRAWN AEROBIC AND ANAEROBIC Blood Culture adequate volume Performed at [facility] [address] [place], [place] [address]    Culture   Final    NO GROWTH 3 DAYS Performed at [facility], [place] [address], [place], [place] [address]    Report Status PENDING  Incomplete  Urine Culture     Status: Abnormal   Collection Time: 04/11/21  3:22 AM   Specimen: Urine, Clean Catch  Result Value Ref Range Status   Specimen Description   Final    URINE, CLEAN CATCH Performed at [facility], [place] [address] [place], [place] [address]    Special Requests   Final    NONE Performed at [facility], [place] [address], [place], [place] [address]    Culture >=100,000 COLONIES/mL PROTEUS MIRABILIS (A)  Final   Report Status 04/13/2021 FINAL  Final   Organism ID, Bacteria PROTEUS MIRABILIS (A)  Final      Susceptibility   Proteus mirabilis - MIC*    AMPICILLIN <=2 SENSITIVE Sensitive     CEFAZOLIN <=4 SENSITIVE Sensitive     CEFEPIME <=0.12 SENSITIVE Sensitive     CEFTRIAXONE <=0.25 SENSITIVE Sensitive     CIPROFLOXACIN <=0.25 SENSITIVE Sensitive     GENTAMICIN <=1 SENSITIVE Sensitive      IMIPENEM 2 SENSITIVE Sensitive     NITROFURANTOIN 128 RESISTANT Resistant     TRIMETH/SULFA <=20 SENSITIVE Sensitive     AMPICILLIN/SULBACTAM <=2 SENSITIVE Sensitive     PIP/TAZO <=4 SENSITIVE Sensitive     * >=100,000 COLONIES/mL PROTEUS MIRABILIS    Procedures/Studies: DG Chest 2 View  Result Date: 04/10/2021 CLINICAL DATA:  One week of shortness of breath, increased fatigue EXAM: CHEST - 2 VIEW COMPARISON:  March 05, 2021 FINDINGS: The heart size and mediastinal contours are within normal limits. Aortic calcifications. Low lung volumes with bibasilar atelectasis degenerative change of the bilateral shoulders, severe on the right. Prior  bilateral clavicular fractures. Prior vertebral augmentation. IMPRESSION: Low lung volumes with bibasilar atelectasis and chronic bronchitic lung changes without acute cardiopulmonary disease. Electronically Signed   By: Dahlia Bailiff M.D.   On: 04/10/2021 22:42   CT ABDOMEN PELVIS W CONTRAST  Result Date: 04/11/2021 CLINICAL DATA:  Abdominal pain EXAM: CT ABDOMEN AND PELVIS WITH CONTRAST TECHNIQUE: Multidetector CT imaging of the abdomen and pelvis was performed using the standard protocol following bolus administration of intravenous contrast. CONTRAST:  44mL OMNIPAQUE IOHEXOL 350 MG/ML SOLN COMPARISON:  None. FINDINGS: Lower chest: Lung bases demonstrate some mild atelectasis on the left. Hiatal hernia is noted. Hepatobiliary: Fatty infiltration of the liver is noted. The gallbladder is decompressed. Pancreas: Pancreas is within normal limits. No inflammatory changes to suggest pancreatitis are noted. A duodenal diverticulum is noted adjacent to the head of the pancreas. Spleen: Scattered calcified granulomas are noted. Adrenals/Urinary Tract: Adrenal glands are within normal limits. Kidneys demonstrate a normal enhancement pattern bilaterally. Tiny nonobstructing renal stone is noted in the lower pole of the left kidney. Fullness of the right renal pelvis  is seen although no true obstructive changes are noted. The bladder is decompressed. Stomach/Bowel: Scattered diverticular changes noted without evidence of diverticulitis. The appendix is within normal limits. Small bowel and stomach are within normal limits. Vascular/Lymphatic: Aortic atherosclerosis. No enlarged abdominal or pelvic lymph nodes. Reproductive: Uterus and bilateral adnexa are unremarkable. Other: No abdominal wall hernia or abnormality. No abdominopelvic ascites. Musculoskeletal: Postsurgical changes consistent with right hip replacement are seen. Degenerative changes of lumbar spine are noted. No acute rib abnormality is noted. IMPRESSION: No evidence of pancreatitis. Fatty liver. Hiatal hernia. Tiny nonobstructing left renal stone. Diverticulosis without diverticulitis. Electronically Signed   By: Inez Catalina M.D.   On: 04/11/2021 03:25     Time coordinating discharge: Over 30 minutes    Dwyane Dee, MD  [facility] 04/14/2021, 9:12 AM

## 2021-04-14 NOTE — Discharge Instructions (Signed)

## 2021-04-14 NOTE — TOC Transition Note (Addendum)
Transition of Care [facility]) - CM/SW Discharge Note   Patient Details  Name: Wanda Jones MRN: [id number] Date of Birth: 30-Jan-1946  [facility] CM/SW Contact:  Leeroy Cha, RN Phone Number: 04/14/2021, 11:20 AM   Clinical Narrative:    Patient discharged to return to [facility].  Tct-[facility]-will pick up around 1230.   Final next level of care: Home/Self Care Barriers to Discharge: No Barriers Identified   Patient Goals and CMS Choice   CMS Medicare.gov Compare Post Acute Care list provided to:: Patient Choice offered to / list presented to : Patient  Discharge Placement                       Discharge Plan and Services   Discharge Planning Services: CM Consult                                 Social Determinants of Health (SDOH) Interventions     Readmission Risk Interventions Readmission Risk Prevention Plan 01/04/2021  Post Dischage Appt Complete  Medication Screening Complete  Transportation Screening Complete

## 2021-04-16 LAB — CULTURE, BLOOD (ROUTINE X 2)
Culture: NO GROWTH
Culture: NO GROWTH
Special Requests: ADEQUATE
Special Requests: ADEQUATE

## 2021-04-20 LAB — GLUCOSE, CAPILLARY: Glucose-Capillary: 42 mg/dL — CL (ref 70–99)

## 2021-04-21 DIAGNOSIS — E785 Hyperlipidemia, unspecified: Secondary | ICD-10-CM | POA: Diagnosis not present

## 2021-04-21 DIAGNOSIS — I10 Essential (primary) hypertension: Secondary | ICD-10-CM | POA: Diagnosis not present

## 2021-04-21 DIAGNOSIS — F0280 Dementia in other diseases classified elsewhere without behavioral disturbance: Secondary | ICD-10-CM | POA: Diagnosis not present

## 2021-04-21 DIAGNOSIS — N390 Urinary tract infection, site not specified: Secondary | ICD-10-CM | POA: Diagnosis not present

## 2021-04-21 DIAGNOSIS — E860 Dehydration: Secondary | ICD-10-CM | POA: Diagnosis not present

## 2021-04-21 DIAGNOSIS — M810 Age-related osteoporosis without current pathological fracture: Secondary | ICD-10-CM | POA: Diagnosis not present

## 2021-04-21 DIAGNOSIS — I2510 Atherosclerotic heart disease of native coronary artery without angina pectoris: Secondary | ICD-10-CM | POA: Diagnosis not present

## 2021-04-21 DIAGNOSIS — E1165 Type 2 diabetes mellitus with hyperglycemia: Secondary | ICD-10-CM | POA: Diagnosis not present

## 2021-04-21 DIAGNOSIS — E039 Hypothyroidism, unspecified: Secondary | ICD-10-CM | POA: Diagnosis not present

## 2021-04-26 DIAGNOSIS — N390 Urinary tract infection, site not specified: Secondary | ICD-10-CM | POA: Diagnosis not present

## 2021-04-26 DIAGNOSIS — M810 Age-related osteoporosis without current pathological fracture: Secondary | ICD-10-CM | POA: Diagnosis not present

## 2021-04-26 DIAGNOSIS — E860 Dehydration: Secondary | ICD-10-CM | POA: Diagnosis not present

## 2021-04-26 DIAGNOSIS — E039 Hypothyroidism, unspecified: Secondary | ICD-10-CM | POA: Diagnosis not present

## 2021-04-26 DIAGNOSIS — E785 Hyperlipidemia, unspecified: Secondary | ICD-10-CM | POA: Diagnosis not present

## 2021-04-26 DIAGNOSIS — I2510 Atherosclerotic heart disease of native coronary artery without angina pectoris: Secondary | ICD-10-CM | POA: Diagnosis not present

## 2021-04-26 DIAGNOSIS — F0280 Dementia in other diseases classified elsewhere without behavioral disturbance: Secondary | ICD-10-CM | POA: Diagnosis not present

## 2021-04-26 DIAGNOSIS — E1165 Type 2 diabetes mellitus with hyperglycemia: Secondary | ICD-10-CM | POA: Diagnosis not present

## 2021-04-26 DIAGNOSIS — I10 Essential (primary) hypertension: Secondary | ICD-10-CM | POA: Diagnosis not present

## 2021-04-27 DIAGNOSIS — E039 Hypothyroidism, unspecified: Secondary | ICD-10-CM | POA: Diagnosis not present

## 2021-04-27 DIAGNOSIS — E1165 Type 2 diabetes mellitus with hyperglycemia: Secondary | ICD-10-CM | POA: Diagnosis not present

## 2021-04-27 DIAGNOSIS — M810 Age-related osteoporosis without current pathological fracture: Secondary | ICD-10-CM | POA: Diagnosis not present

## 2021-04-27 DIAGNOSIS — N390 Urinary tract infection, site not specified: Secondary | ICD-10-CM | POA: Diagnosis not present

## 2021-04-27 DIAGNOSIS — I10 Essential (primary) hypertension: Secondary | ICD-10-CM | POA: Diagnosis not present

## 2021-04-27 DIAGNOSIS — E860 Dehydration: Secondary | ICD-10-CM | POA: Diagnosis not present

## 2021-04-27 DIAGNOSIS — E785 Hyperlipidemia, unspecified: Secondary | ICD-10-CM | POA: Diagnosis not present

## 2021-04-27 DIAGNOSIS — I2510 Atherosclerotic heart disease of native coronary artery without angina pectoris: Secondary | ICD-10-CM | POA: Diagnosis not present

## 2021-04-27 DIAGNOSIS — F0280 Dementia in other diseases classified elsewhere without behavioral disturbance: Secondary | ICD-10-CM | POA: Diagnosis not present

## 2021-04-30 ENCOUNTER — Other Ambulatory Visit: Payer: Self-pay

## 2021-04-30 ENCOUNTER — Other Ambulatory Visit: Payer: Medicare HMO | Admitting: Hospice

## 2021-04-30 DIAGNOSIS — Z515 Encounter for palliative care: Secondary | ICD-10-CM

## 2021-04-30 DIAGNOSIS — E1069 Type 1 diabetes mellitus with other specified complication: Secondary | ICD-10-CM

## 2021-04-30 DIAGNOSIS — F0390 Unspecified dementia without behavioral disturbance: Secondary | ICD-10-CM

## 2021-04-30 DIAGNOSIS — R531 Weakness: Secondary | ICD-10-CM

## 2021-04-30 NOTE — Progress Notes (Signed)
[facility] Palliative Care Consult Note Telephone: [phone]  Fax: [phone]  PATIENT NAME: Wanda Jones [address] [place] [address] [phone] (home)  DOB: 1946-04-03 MRN: [id number]  PRIMARY CARE PROVIDER:    Roetta Sessions, NP [address] [place],  [place] [address]  REFERRING PROVIDER:   Roetta Sessions, NP [address] [place],  [place] [address] [phone]  RESPONSIBLE PARTY:   Self Retired after 59 years from the [employer] in [employer]. Contact Information     Name Relation Home Work Mobile   Fontenot,Chris Son   [phone]        I met face to face with patient and family at home. Palliative Care was asked to follow this patient by consultation request of  Roetta Sessions* to address advance care planning, complex medical decision making and goals of care clarification. This is the follow up visit. RECOMMENDATIONS:   CODE STATUS:Patient affirmed she is a Full Code  Goals of Care: Goals include to maximize quality of life and symptom management  Symptom Management/Plan: Dementia: Memory loss at baseline. FAST 6C. Managed with Donepezil, Memantin. Encouarge participation in activities, reminiscence, word search/puzzles.  Type 1 DM: Blood sugar monitored with Freestyle Libre 2. Managed with Novolog and [place]. Med Tech from [facility] check her blood sugar and ensure she takes her insulin. Saw Endocrinologist 04/02/2021 with instructions to check blood sugar twice a day,  vary the time of day when you check, between before the 3 meals, and at bedtime.  also check if you have symptoms of your blood sugar being too high or too low. Take the recordings to Endocrinologist with next follow up. Report blood sugar below 70 or over 200. Last A1c last month 02/17/21  is 9.2. [name] reduced to 8 units per day, no changes to current dose of insulin.  Ozempic added.  Follow up with Endocrinologist as planned.  Repeat A1c.  Weakness: PT/OT is ongoing for strengthening.  Follow up: Palliative care will continue to follow for complex medical decision making, advance care planning, and clarification of goals. Return 6 weeks or prn.Encouraged to call provider sooner with any concerns.   Family /Caregiver/Community Supports: Patient lives at home; help from [employer] [facility], and son lives close by and comes every day to help. Strong family support system identified.   HOSPICE ELIGIBILITY/DIAGNOSIS: TBD  Chief Complaint: Follow up visit  HISTORY OF PRESENT ILLNESS:  Wanda Jones is a 76 y.o. year old female  with multiple medical conditions including  Dementia with associated memory loss, Type 1 DM, recurrent UTI, Depression, moderate protein caloric malnutrition. Patient denies pain/discomfort, denies moodiness, in no acute distress History obtained from review of EMR, discussion with primary team, caregiver, family and/or Wanda Jones.  Review and summarization of Epic records shows history from other than patient. Rest of 10 point ROS asked and negative.  I reviewed, as needed, available labs, patient records, imaging, studies and related documents from the EMR.   ROS General: NAD EYES: denies vision changes ENMT: denies dysphagia Cardiovascular: denies chest pain/discomfort Pulmonary: denies cough, denies SOB Abdomen: endorses good appetite, denies constipation/diarrhea GU: denies dysuria, urinary frequency MSK:  endorses weakness,  no falls reported Skin: denies rashes or wounds Neurological: denies pain, denies insomnia Psych: Endorses positive mood Heme/lymph/immuno: denies bruises, abnormal bleeding  Physical Exam: Constitutional: NAD General: Well groomed, resting in bed, cooperative EYES: anicteric sclera, lids intact, no discharge  ENMT: Moist mucous membrane CV: S1 S2, RRR, no LE edema Pulmonary: LCTA, no increased work of breathing, no  cough, Abdomen: active BS + 4 quadrants, soft and non tender GU: no suprapubic tenderness MSK: weakness, ambulatory with rolling walker Skin: warm and dry, no rashes or wounds on visible skin Neuro:  weakness, otherwise non focal, memory loss.  Psych: non-anxious affect Hem/lymph/immuno: no widespread bruising   PAST MEDICAL HISTORY:  Active Ambulatory Problems    Diagnosis Date Noted   Sepsis ([facility]) 01/02/2021   UTI (urinary tract infection) 01/02/2021   Cellulitis 01/02/2021   Hyperglycemia 01/02/2021   Type 2 diabetes mellitus ([place]) 01/02/2021   Sepsis secondary to UTI ([facility]) 01/24/2021   Volume depletion 01/24/2021   Hyperlipidemia 01/24/2021   Dementia ([place]) 01/24/2021   Acute encephalopathy 01/24/2021   Mild protein malnutrition ([facility]) 01/24/2021   Weakness 03/06/2021   Pressure injury of skin 03/07/2021   Acute cystitis without hematuria 04/11/2021   Acute lower UTI (urinary tract infection) 04/11/2021   Sleep apnea 04/11/2021   Depression 04/11/2021   Incidental finding of COVID-19 virus infection 04/11/2021   Type 2 diabetes mellitus with hyperglycemia ([place]) 04/11/2021   Elevated lipase 04/11/2021   Resolved Ambulatory Problems    Diagnosis Date Noted   AKI (acute kidney injury) ([facility]) 62/94/7654   Acute metabolic encephalopathy 65/06/5463   Past Medical History:  Diagnosis Date   Diabetes mellitus type 1 ([place])    History of recurrent UTIs     SOCIAL HX:  Social History   Tobacco Use   Smoking status: Never   Smokeless tobacco: Not on file  Substance Use Topics   Alcohol use: Not on file     FAMILY HX:  Family History  Problem Relation Age of Onset   Hypertension Other    Diabetes Neg Hx       ALLERGIES:  Allergies  Allergen Reactions   Codeine Anaphylaxis   Prednisone Anaphylaxis      PERTINENT MEDICATIONS:  Outpatient Encounter Medications as of 04/30/2021  Medication Sig   acetaminophen (TYLENOL) 325 MG tablet Take 325 mg by mouth every 6  (six) hours as needed for moderate pain or headache.   cetirizine (ZYRTEC) 10 MG tablet Take 10 mg by mouth daily as needed for allergies.   clobetasol (TEMOVATE) 0.05 % external solution Apply 1 application topically 2 (two) times daily as needed (irritation).   Continuous Blood Gluc Receiver (FREESTYLE LIBRE 2 READER) DEVI Use as directed. E10.69   donepezil (ARICEPT) 5 MG tablet Take 5 mg by mouth at bedtime.   insulin aspart (NOVOLOG FLEXPEN) 100 UNIT/ML FlexPen 3 times a day (just before each meal), 6-5-5 units. (Patient taking differently: 5-6 Units 3 (three) times daily with meals. Take 6 units in the morning, Take 5 units in the afternoon & Take 5 units in the evening)   insulin degludec (TRESIBA FLEXTOUCH) 100 UNIT/ML FlexTouch Pen Inject 8 Units into the skin daily.   levothyroxine (SYNTHROID) 88 MCG tablet Take 88 mcg by mouth daily before breakfast.   memantine (NAMENDA) 5 MG tablet Take 5 mg by mouth 2 (two) times daily.   Multiple Vitamins-Minerals (MULTIVITAMIN ADULTS) TABS Take 1 tablet by mouth daily.   MYRBETRIQ 50 MG TB24 tablet Take 50 mg by mouth daily.   rosuvastatin (CRESTOR) 5 MG tablet Take 1 tablet (5 mg total) by mouth at bedtime.   Semaglutide,0.25 or 0.5MG/DOS, (OZEMPIC, 0.25 OR 0.5 MG/DOSE,) 2 MG/1.5ML SOPN Inject 0.5 mg into the skin once a  week.   venlafaxine XR (EFFEXOR-XR) 150 MG 24 hr capsule Take 150 mg by mouth daily with breakfast.   vitamin B-12 (CYANOCOBALAMIN) 1000 MCG tablet Take 1,000 mcg by mouth daily.   No facility-administered encounter medications on file as of 04/30/2021.  I spent 40 minutes providing this consultation; time includes spent with patient/family, chart review and documentation. More than 50% of the time in this consultation was spent on care coordination Thank you for the opportunity to participate in the care of Wanda Jones.  The palliative care team will continue to follow. Please call our office at [phone] if we can be of  additional assistance.   Note: Portions of this note were generated with [employer]. Dictation errors may occur despite best attempts at proofreading.  Teodoro Spray, NP

## 2021-05-03 DIAGNOSIS — E039 Hypothyroidism, unspecified: Secondary | ICD-10-CM | POA: Diagnosis not present

## 2021-05-03 DIAGNOSIS — E860 Dehydration: Secondary | ICD-10-CM | POA: Diagnosis not present

## 2021-05-03 DIAGNOSIS — E785 Hyperlipidemia, unspecified: Secondary | ICD-10-CM | POA: Diagnosis not present

## 2021-05-03 DIAGNOSIS — I10 Essential (primary) hypertension: Secondary | ICD-10-CM | POA: Diagnosis not present

## 2021-05-03 DIAGNOSIS — I2510 Atherosclerotic heart disease of native coronary artery without angina pectoris: Secondary | ICD-10-CM | POA: Diagnosis not present

## 2021-05-03 DIAGNOSIS — M810 Age-related osteoporosis without current pathological fracture: Secondary | ICD-10-CM | POA: Diagnosis not present

## 2021-05-03 DIAGNOSIS — N390 Urinary tract infection, site not specified: Secondary | ICD-10-CM | POA: Diagnosis not present

## 2021-05-03 DIAGNOSIS — F0280 Dementia in other diseases classified elsewhere without behavioral disturbance: Secondary | ICD-10-CM | POA: Diagnosis not present

## 2021-05-03 DIAGNOSIS — E1165 Type 2 diabetes mellitus with hyperglycemia: Secondary | ICD-10-CM | POA: Diagnosis not present

## 2021-05-05 DIAGNOSIS — F0280 Dementia in other diseases classified elsewhere without behavioral disturbance: Secondary | ICD-10-CM | POA: Diagnosis not present

## 2021-05-05 DIAGNOSIS — E785 Hyperlipidemia, unspecified: Secondary | ICD-10-CM | POA: Diagnosis not present

## 2021-05-05 DIAGNOSIS — I10 Essential (primary) hypertension: Secondary | ICD-10-CM | POA: Diagnosis not present

## 2021-05-05 DIAGNOSIS — E1165 Type 2 diabetes mellitus with hyperglycemia: Secondary | ICD-10-CM | POA: Diagnosis not present

## 2021-05-05 DIAGNOSIS — N390 Urinary tract infection, site not specified: Secondary | ICD-10-CM | POA: Diagnosis not present

## 2021-05-05 DIAGNOSIS — I2510 Atherosclerotic heart disease of native coronary artery without angina pectoris: Secondary | ICD-10-CM | POA: Diagnosis not present

## 2021-05-05 DIAGNOSIS — E039 Hypothyroidism, unspecified: Secondary | ICD-10-CM | POA: Diagnosis not present

## 2021-05-05 DIAGNOSIS — M810 Age-related osteoporosis without current pathological fracture: Secondary | ICD-10-CM | POA: Diagnosis not present

## 2021-05-05 DIAGNOSIS — E860 Dehydration: Secondary | ICD-10-CM | POA: Diagnosis not present

## 2021-05-06 DIAGNOSIS — I10 Essential (primary) hypertension: Secondary | ICD-10-CM | POA: Diagnosis not present

## 2021-05-06 DIAGNOSIS — E785 Hyperlipidemia, unspecified: Secondary | ICD-10-CM | POA: Diagnosis not present

## 2021-05-06 DIAGNOSIS — E860 Dehydration: Secondary | ICD-10-CM | POA: Diagnosis not present

## 2021-05-06 DIAGNOSIS — E1165 Type 2 diabetes mellitus with hyperglycemia: Secondary | ICD-10-CM | POA: Diagnosis not present

## 2021-05-06 DIAGNOSIS — N390 Urinary tract infection, site not specified: Secondary | ICD-10-CM | POA: Diagnosis not present

## 2021-05-06 DIAGNOSIS — M810 Age-related osteoporosis without current pathological fracture: Secondary | ICD-10-CM | POA: Diagnosis not present

## 2021-05-06 DIAGNOSIS — I2510 Atherosclerotic heart disease of native coronary artery without angina pectoris: Secondary | ICD-10-CM | POA: Diagnosis not present

## 2021-05-06 DIAGNOSIS — E039 Hypothyroidism, unspecified: Secondary | ICD-10-CM | POA: Diagnosis not present

## 2021-05-06 DIAGNOSIS — F0280 Dementia in other diseases classified elsewhere without behavioral disturbance: Secondary | ICD-10-CM | POA: Diagnosis not present

## 2021-05-20 ENCOUNTER — Ambulatory Visit: Payer: Medicare HMO | Admitting: Endocrinology

## 2021-06-28 ENCOUNTER — Inpatient Hospital Stay (HOSPITAL_COMMUNITY)
Admission: EM | Admit: 2021-06-28 | Discharge: 2021-06-30 | DRG: 871 | Disposition: A | Discharge status: Home Health | Payer: Medicare PPO | Source: Skilled Nursing Facility | Attending: Internal Medicine | Admitting: Internal Medicine

## 2021-06-28 ENCOUNTER — Encounter (HOSPITAL_COMMUNITY): Payer: Self-pay

## 2021-06-28 ENCOUNTER — Other Ambulatory Visit: Payer: Self-pay

## 2021-06-28 ENCOUNTER — Emergency Department (HOSPITAL_COMMUNITY): Payer: Medicare PPO

## 2021-06-28 DIAGNOSIS — Z87440 Personal history of urinary (tract) infections: Secondary | ICD-10-CM

## 2021-06-28 DIAGNOSIS — G9341 Metabolic encephalopathy: Secondary | ICD-10-CM | POA: Diagnosis present

## 2021-06-28 DIAGNOSIS — Z7982 Long term (current) use of aspirin: Secondary | ICD-10-CM | POA: Diagnosis not present

## 2021-06-28 DIAGNOSIS — G9340 Encephalopathy, unspecified: Secondary | ICD-10-CM | POA: Diagnosis present

## 2021-06-28 DIAGNOSIS — R400 Somnolence: Principal | ICD-10-CM

## 2021-06-28 DIAGNOSIS — F0390 Unspecified dementia without behavioral disturbance: Secondary | ICD-10-CM | POA: Diagnosis not present

## 2021-06-28 DIAGNOSIS — Z20822 Contact with and (suspected) exposure to covid-19: Secondary | ICD-10-CM | POA: Diagnosis present

## 2021-06-28 DIAGNOSIS — N179 Acute kidney failure, unspecified: Secondary | ICD-10-CM | POA: Diagnosis present

## 2021-06-28 DIAGNOSIS — E119 Type 2 diabetes mellitus without complications: Secondary | ICD-10-CM

## 2021-06-28 DIAGNOSIS — E785 Hyperlipidemia, unspecified: Secondary | ICD-10-CM | POA: Diagnosis present

## 2021-06-28 DIAGNOSIS — Z79899 Other long term (current) drug therapy: Secondary | ICD-10-CM | POA: Diagnosis not present

## 2021-06-28 DIAGNOSIS — F32A Depression, unspecified: Secondary | ICD-10-CM | POA: Diagnosis present

## 2021-06-28 DIAGNOSIS — A419 Sepsis, unspecified organism: Principal | ICD-10-CM | POA: Diagnosis present

## 2021-06-28 DIAGNOSIS — R7989 Other specified abnormal findings of blood chemistry: Secondary | ICD-10-CM | POA: Diagnosis present

## 2021-06-28 DIAGNOSIS — E1165 Type 2 diabetes mellitus with hyperglycemia: Secondary | ICD-10-CM | POA: Diagnosis not present

## 2021-06-28 DIAGNOSIS — Z794 Long term (current) use of insulin: Secondary | ICD-10-CM

## 2021-06-28 DIAGNOSIS — J189 Pneumonia, unspecified organism: Secondary | ICD-10-CM | POA: Diagnosis present

## 2021-06-28 DIAGNOSIS — F0393 Unspecified dementia, unspecified severity, with mood disturbance: Secondary | ICD-10-CM | POA: Diagnosis present

## 2021-06-28 DIAGNOSIS — E11649 Type 2 diabetes mellitus with hypoglycemia without coma: Secondary | ICD-10-CM | POA: Diagnosis present

## 2021-06-28 DIAGNOSIS — E039 Hypothyroidism, unspecified: Secondary | ICD-10-CM | POA: Diagnosis present

## 2021-06-28 DIAGNOSIS — Z888 Allergy status to other drugs, medicaments and biological substances status: Secondary | ICD-10-CM | POA: Diagnosis not present

## 2021-06-28 DIAGNOSIS — Z885 Allergy status to narcotic agent status: Secondary | ICD-10-CM | POA: Diagnosis not present

## 2021-06-28 DIAGNOSIS — Z8249 Family history of ischemic heart disease and other diseases of the circulatory system: Secondary | ICD-10-CM | POA: Diagnosis not present

## 2021-06-28 DIAGNOSIS — R5383 Other fatigue: Secondary | ICD-10-CM

## 2021-06-28 HISTORY — DX: Sepsis, unspecified organism: A41.9

## 2021-06-28 LAB — COMPREHENSIVE METABOLIC PANEL
ALT: 19 U/L (ref 0–44)
AST: 22 U/L (ref 15–41)
Albumin: 3.4 g/dL — ABNORMAL LOW (ref 3.5–5.0)
Alkaline Phosphatase: 106 U/L (ref 38–126)
Anion gap: 7 (ref 5–15)
BUN: 28 mg/dL — ABNORMAL HIGH (ref 8–23)
CO2: 28 mmol/L (ref 22–32)
Calcium: 9.3 mg/dL (ref 8.9–10.3)
Chloride: 102 mmol/L (ref 98–111)
Creatinine, Ser: 1.25 mg/dL — ABNORMAL HIGH (ref 0.44–1.00)
GFR, Estimated: 45 mL/min — ABNORMAL LOW (ref >60)
Glucose, Bld: 107 mg/dL — ABNORMAL HIGH (ref 70–99)
Potassium: 4.2 mmol/L (ref 3.5–5.1)
Sodium: 137 mmol/L (ref 135–145)
Total Bilirubin: 0.3 mg/dL (ref 0.3–1.2)
Total Protein: 6.7 g/dL (ref 6.5–8.1)

## 2021-06-28 LAB — CBC WITH DIFFERENTIAL/PLATELET
Abs Immature Granulocytes: 0.07 10*3/uL (ref 0.00–0.07)
Basophils Absolute: 0 10*3/uL (ref 0.0–0.1)
Basophils Relative: 0 %
Eosinophils Absolute: 0 10*3/uL (ref 0.0–0.5)
Eosinophils Relative: 0 %
HCT: 36.9 % (ref 36.0–46.0)
Hemoglobin: 12 g/dL (ref 12.0–15.0)
Immature Granulocytes: 1 %
Lymphocytes Relative: 7 %
Lymphs Abs: 1 10*3/uL (ref 0.7–4.0)
MCH: 29.1 pg (ref 26.0–34.0)
MCHC: 32.5 g/dL (ref 30.0–36.0)
MCV: 89.3 fL (ref 80.0–100.0)
Monocytes Absolute: 0.9 10*3/uL (ref 0.1–1.0)
Monocytes Relative: 6 %
Neutro Abs: 13 10*3/uL — ABNORMAL HIGH (ref 1.7–7.7)
Neutrophils Relative %: 86 %
Platelets: 257 10*3/uL (ref 150–400)
RBC: 4.13 MIL/uL (ref 3.87–5.11)
RDW: 13.1 % (ref 11.5–15.5)
WBC: 15 10*3/uL — ABNORMAL HIGH (ref 4.0–10.5)
nRBC: 0 % (ref 0.0–0.2)

## 2021-06-28 LAB — URINALYSIS, ROUTINE W REFLEX MICROSCOPIC
Bilirubin Urine: NEGATIVE
Glucose, UA: >=500 mg/dL — AB
Hgb urine dipstick: NEGATIVE
Ketones, ur: NEGATIVE mg/dL
Leukocytes,Ua: NEGATIVE
Nitrite: NEGATIVE
Protein, ur: NEGATIVE mg/dL
Specific Gravity, Urine: 1.018 (ref 1.005–1.030)
pH: 5 (ref 5.0–8.0)

## 2021-06-28 LAB — LACTIC ACID, PLASMA
Lactic Acid, Venous: 2.2 mmol/L (ref 0.5–1.9)
Lactic Acid, Venous: 2.3 mmol/L (ref 0.5–1.9)

## 2021-06-28 LAB — RESP PANEL BY RT-PCR (FLU A&B, COVID) ARPGX2
Influenza A by PCR: NEGATIVE
Influenza B by PCR: NEGATIVE
SARS Coronavirus 2 by RT PCR: NEGATIVE

## 2021-06-28 LAB — AMMONIA: Ammonia: 20 umol/L (ref 9–35)

## 2021-06-28 LAB — PROTIME-INR
INR: 1 (ref 0.8–1.2)
Prothrombin Time: 13.1 seconds (ref 11.4–15.2)

## 2021-06-28 MED ORDER — MIRABEGRON ER 25 MG PO TB24
50.0000 mg | ORAL_TABLET | Freq: Every day | ORAL | Status: DC
  Administered 2021-06-29 – 2021-06-30 (×2): 50 mg via ORAL
  Filled 2021-06-28 (×2): qty 2

## 2021-06-28 MED ORDER — ACETAMINOPHEN 325 MG PO TABS
650.0000 mg | ORAL_TABLET | Freq: Four times a day (QID) | ORAL | Status: DC | PRN
Start: 2021-06-28 — End: 2021-06-30

## 2021-06-28 MED ORDER — LEVOTHYROXINE SODIUM 88 MCG PO TABS
88.0000 ug | ORAL_TABLET | Freq: Every day | ORAL | Status: DC
  Administered 2021-06-29: 88 ug via ORAL
  Filled 2021-06-28: qty 1

## 2021-06-28 MED ORDER — LACTATED RINGERS IV SOLN: INTRAVENOUS | Status: DC

## 2021-06-28 MED ORDER — ENOXAPARIN SODIUM 40 MG/0.4ML IJ SOSY
40.0000 mg | PREFILLED_SYRINGE | INTRAMUSCULAR | Status: DC
  Administered 2021-06-28 – 2021-06-29 (×2): 40 mg via SUBCUTANEOUS
  Filled 2021-06-28 (×2): qty 0.4

## 2021-06-28 MED ORDER — ACETAMINOPHEN 650 MG RE SUPP: 650.0000 mg | Freq: Four times a day (QID) | RECTAL | Status: DC | PRN

## 2021-06-28 MED ORDER — CHLORHEXIDINE GLUCONATE CLOTH 2 % EX PADS: 6.0000 | MEDICATED_PAD | Freq: Every day | CUTANEOUS | Status: DC

## 2021-06-28 MED ORDER — INSULIN ASPART 100 UNIT/ML IJ SOLN
0.0000 [IU] | Freq: Three times a day (TID) | INTRAMUSCULAR | Status: DC
  Administered 2021-06-29: 3 [IU] via SUBCUTANEOUS
  Administered 2021-06-29: 1 [IU] via SUBCUTANEOUS
  Administered 2021-06-29: 9 [IU] via SUBCUTANEOUS
  Administered 2021-06-30 (×2): 5 [IU] via SUBCUTANEOUS
  Filled 2021-06-28: qty 0.09

## 2021-06-28 MED ORDER — ORAL CARE MOUTH RINSE
15.0000 mL | Freq: Two times a day (BID) | OROMUCOSAL | Status: DC
  Administered 2021-06-28 – 2021-06-30 (×4): 15 mL via OROMUCOSAL

## 2021-06-28 MED ORDER — SODIUM CHLORIDE 0.9 % IV BOLUS
1000.0000 mL | Freq: Once | INTRAVENOUS | Status: AC
  Administered 2021-06-28: 1000 mL via INTRAVENOUS

## 2021-06-28 MED ORDER — SODIUM CHLORIDE 0.9% FLUSH
3.0000 mL | Freq: Two times a day (BID) | INTRAVENOUS | Status: DC
  Administered 2021-06-29 – 2021-06-30 (×3): 3 mL via INTRAVENOUS

## 2021-06-28 MED ORDER — SODIUM CHLORIDE 0.9 % IV SOLN
500.0000 mg | INTRAVENOUS | Status: DC
  Administered 2021-06-29: 500 mg via INTRAVENOUS
  Filled 2021-06-28: qty 5

## 2021-06-28 MED ORDER — ACETAMINOPHEN 500 MG PO TABS
500.0000 mg | ORAL_TABLET | Freq: Once | ORAL | Status: AC
  Administered 2021-06-28: 500 mg via ORAL
  Filled 2021-06-28: qty 1

## 2021-06-28 MED ORDER — POLYETHYLENE GLYCOL 3350 17 G PO PACK: 17.0000 g | PACK | Freq: Every day | ORAL | Status: DC | PRN

## 2021-06-28 MED ORDER — SODIUM CHLORIDE 0.9 % IV SOLN
2.0000 g | INTRAVENOUS | Status: DC
  Administered 2021-06-29: 2 g via INTRAVENOUS
  Filled 2021-06-28 (×2): qty 20

## 2021-06-28 MED ORDER — ASPIRIN EC 81 MG PO TBEC
81.0000 mg | DELAYED_RELEASE_TABLET | Freq: Every day | ORAL | Status: DC
  Administered 2021-06-29 – 2021-06-30 (×2): 81 mg via ORAL
  Filled 2021-06-28 (×2): qty 1

## 2021-06-28 MED ORDER — ROSUVASTATIN CALCIUM 5 MG PO TABS
5.0000 mg | ORAL_TABLET | Freq: Every day | ORAL | Status: DC
  Administered 2021-06-29: 5 mg via ORAL
  Filled 2021-06-28 (×2): qty 1

## 2021-06-28 MED ORDER — SODIUM CHLORIDE 0.9 % IV BOLUS
1000.0000 mL | Freq: Once | INTRAVENOUS | Status: AC
Start: 2021-06-28 — End: 2021-06-28
  Administered 2021-06-28: 1000 mL via INTRAVENOUS

## 2021-06-28 MED ORDER — SODIUM CHLORIDE 0.9 % IV SOLN
500.0000 mg | Freq: Once | INTRAVENOUS | Status: AC
  Administered 2021-06-28: 500 mg via INTRAVENOUS
  Filled 2021-06-28: qty 5

## 2021-06-28 MED ORDER — SODIUM CHLORIDE 0.9 % IV SOLN
1.0000 g | Freq: Once | INTRAVENOUS | Status: AC
  Administered 2021-06-28: 1 g via INTRAVENOUS
  Filled 2021-06-28: qty 10

## 2021-06-28 NOTE — ED Provider Notes (Incomplete)
[facility] DEPT Provider Note   CSN: [id number] Arrival date & time: 06/28/21  1753     History {Add pertinent medical, surgical, social history, OB history to HPI:1} Chief Complaint  Patient presents with   Weakness   Altered Mental Status    Wanda Jones is a 76 y.o. female.  The history is provided by the patient and medical records. No language interpreter was used.  Weakness Severity:  Moderate Onset quality:  Unable to specify Timing:  Constant Progression:  Unable to specify Relieved by:  Nothing Worsened by:  Nothing Associated symptoms: cough and diarrhea   Associated symptoms: no abdominal pain, no chest pain, no dysuria, no falls, no fever, no foul-smelling urine (darkened urine by pt report), no frequency, no headaches, no loss of consciousness, no melena, no nausea, no seizures, no shortness of breath and no vomiting   Altered Mental Status Presenting symptoms: confusion   Severity:  Moderate Episode history:  Unable to specify Timing:  Constant Progression:  Unchanged Context: dementia   Context: not head injury   Associated symptoms: weakness   Associated symptoms: no abdominal pain, no agitation, no fever, no headaches, no light-headedness, no nausea, no palpitations, no rash, no seizures and no vomiting       Home Medications Prior to Admission medications   Medication Sig Start Date End Date Taking? Authorizing Provider  acetaminophen (TYLENOL) 325 MG tablet Take 325 mg by mouth every 6 (six) hours as needed for moderate pain or headache.    [provider]  cetirizine (ZYRTEC) 10 MG tablet Take 10 mg by mouth daily as needed for allergies.    [provider]  clobetasol (TEMOVATE) 0.05 % external solution Apply 1 application topically 2 (two) times daily as needed (irritation).    [provider]  Continuous Blood Gluc Receiver (FREESTYLE LIBRE 2 READER) DEVI Use as directed. E10.69 03/17/21    Renato Shin, MD  donepezil (ARICEPT) 5 MG tablet Take 5 mg by mouth at bedtime.    [provider]  insulin aspart (NOVOLOG FLEXPEN) 100 UNIT/ML FlexPen 3 times a day (just before each meal), 6-5-5 units. Patient taking differently: 5-6 Units 3 (three) times daily with meals. Take 6 units in the morning, Take 5 units in the afternoon & Take 5 units in the evening 02/19/21   Renato Shin, MD  insulin degludec (TRESIBA FLEXTOUCH) 100 UNIT/ML FlexTouch Pen Inject 8 Units into the skin daily. 04/02/21   Renato Shin, MD  levothyroxine (SYNTHROID) 88 MCG tablet Take 88 mcg by mouth daily before breakfast.    [provider]  memantine (NAMENDA) 5 MG tablet Take 5 mg by mouth 2 (two) times daily.    [provider]  Multiple Vitamins-Minerals (MULTIVITAMIN ADULTS) TABS Take 1 tablet by mouth daily.    [provider]  MYRBETRIQ 50 MG TB24 tablet Take 50 mg by mouth daily. 11/27/20   [provider]  rosuvastatin (CRESTOR) 5 MG tablet Take 1 tablet (5 mg total) by mouth at bedtime. 04/22/21   Dwyane Dee, MD  Semaglutide,0.25 or 0.'5MG'$ /DOS, (OZEMPIC, 0.25 OR 0.5 MG/DOSE,) 2 MG/1.5ML SOPN Inject 0.5 mg into the skin once a week. 04/02/21   Renato Shin, MD  venlafaxine XR (EFFEXOR-XR) 150 MG 24 hr capsule Take 150 mg by mouth daily with breakfast.    [provider]  vitamin B-12 (CYANOCOBALAMIN) 1000 MCG tablet Take 1,000 mcg by mouth daily.    [provider]  Allergies    Codeine and Prednisone    Review of Systems   Review of Systems  Unable to perform ROS: Dementia  Constitutional:  Positive for chills and fatigue. Negative for diaphoresis and fever.  HENT:  Negative for congestion.   Respiratory:  Positive for cough. Negative for chest tightness, shortness of breath, wheezing and stridor.   Cardiovascular:  Negative for chest pain and palpitations.  Gastrointestinal:  Positive for diarrhea. Negative for abdominal pain,  constipation, melena, nausea and vomiting.  Genitourinary:  Negative for dysuria, flank pain and frequency.  Musculoskeletal:  Negative for back pain, falls, neck pain and neck stiffness.  Skin:  Negative for rash and wound.  Neurological:  Positive for weakness. Negative for seizures, loss of consciousness, light-headedness and headaches.  Psychiatric/Behavioral:  Positive for confusion. Negative for agitation.   All other systems reviewed and are negative.  Physical Exam Updated Vital Signs BP (!) 109/92 (BP Location: Left Arm)    Pulse (!) 110    Temp 100.1 F (37.8 C) (Oral)    Resp (!) 22    Ht 5' 3.5" (1.613 m)    Wt 58.5 kg    SpO2 92%    BMI 22.49 kg/m  Physical Exam Vitals and nursing note reviewed.  Constitutional:      General: She is not in acute distress.    Appearance: She is well-developed. She is ill-appearing.  HENT:     Head: Normocephalic and atraumatic.     Nose: No congestion or rhinorrhea.     Mouth/Throat:     Mouth: Mucous membranes are moist.     Pharynx: No oropharyngeal exudate or posterior oropharyngeal erythema.  Eyes:     Extraocular Movements: Extraocular movements intact.     Conjunctiva/sclera: Conjunctivae normal.     Pupils: Pupils are equal, round, and reactive to light.  Cardiovascular:     Rate and Rhythm: Normal rate and regular rhythm.     Heart sounds: No murmur heard. Pulmonary:     Effort: Pulmonary effort is normal. No respiratory distress.     Breath sounds: Rhonchi present. No rales.  Chest:     Chest wall: No tenderness.  Abdominal:     General: Abdomen is flat.     Palpations: Abdomen is soft.     Tenderness: There is no abdominal tenderness. There is no right CVA tenderness, left CVA tenderness, guarding or rebound.  Musculoskeletal:        General: No swelling or tenderness.     Cervical back: Neck supple.     Right lower leg: No edema.     Left lower leg: No edema.  Skin:    General: Skin is warm and dry.     Capillary  Refill: Capillary refill takes less than 2 seconds.     Findings: No erythema or rash.  Neurological:     General: No focal deficit present.     Mental Status: She is alert.     Sensory: No sensory deficit.     Motor: No weakness.  Psychiatric:        Mood and Affect: Mood normal.    ED Results / Procedures / Treatments   Labs (all labs ordered are listed, but only abnormal results are displayed) Labs Reviewed  COMPREHENSIVE METABOLIC PANEL - Abnormal; Notable for the following components:      Result Value   Glucose, Bld 107 (*)    BUN 28 (*)    Creatinine, Ser 1.25 (*)  Albumin 3.4 (*)    GFR, Estimated 45 (*)    All other components within normal limits  LACTIC ACID, PLASMA - Abnormal; Notable for the following components:   Lactic Acid, Venous 2.2 (*)    All other components within normal limits  CBC WITH DIFFERENTIAL/PLATELET - Abnormal; Notable for the following components:   WBC 15.0 (*)    Neutro Abs 13.0 (*)    All other components within normal limits  CULTURE, BLOOD (ROUTINE X 2)  CULTURE, BLOOD (ROUTINE X 2)  URINE CULTURE  RESP PANEL BY RT-PCR (FLU A&B, COVID) ARPGX2  PROTIME-INR  LACTIC ACID, PLASMA  URINALYSIS, ROUTINE W REFLEX MICROSCOPIC  TSH  AMMONIA    EKG EKG Interpretation  Date/Time:  Monday June 28 2021 18:15:42 EDT Ventricular Rate:  106 PR Interval:  148 QRS Duration: 64 QT Interval:  302 QTC Calculation: 401 R Axis:   38 Text Interpretation: Sinus tachycardia Low voltage, extremity and precordial leads when compared to prior, similar appearance. No STEMI Confirmed by Antony Blackbird [phone]) on 06/28/2021 7:35:48 PM  Radiology DG Chest 2 View  Result Date: 06/28/2021 CLINICAL DATA:  Increased confusion, history dementia, diabetes mellitus, hyperlipidemia EXAM: CHEST - 2 VIEW COMPARISON:  04/10/2021 FINDINGS: Enlargement of cardiac silhouette. Mediastinal contours and pulmonary vascularity normal. Patchy infiltrates bilaterally greater  in RIGHT upper lobe and LEFT lower lobe favoring multifocal pneumonia. No definite pleural effusion or pneumothorax. Bones demineralized. IMPRESSION: Patchy BILATERAL pulmonary infiltrates greater in RIGHT upper lobe and LEFT lower lobe question of multifocal pneumonia. Electronically Signed   By: Lavonia Dana M.D.   On: 06/28/2021 18:44    Procedures Procedures  {Document cardiac monitor, telemetry assessment procedure when appropriate:1}  Medications Ordered in ED Medications  cefTRIAXone (ROCEPHIN) 1 g in sodium chloride 0.9 % 100 mL IVPB (1 g Intravenous New Bag/Given 06/28/21 1932)  azithromycin (ZITHROMAX) 500 mg in sodium chloride 0.9 % 250 mL IVPB (has no administration in time range)  sodium chloride 0.9 % bolus 1,000 mL (1,000 mLs Intravenous New Bag/Given 06/28/21 1933)    ED Course/ Medical Decision Making/ A&P                           Medical Decision Making Amount and/or Complexity of Data Reviewed Labs: ordered. Radiology: ordered.  Risk OTC drugs. Decision regarding hospitalization.    Aydia Maj is a 76 y.o. female with a past medical history significant for dementia, diabetes, hyperlipidemia and recurrent urinary tract infections who presents with fatigue, altered mental status, darkened urine, cough, and generalized fatigue.  According to patient, last few days she has been feeling bad.  She normally is alert and oriented per EMS report to nursing however today is less so.  She does agree to have some dry cough, fatigue, chills, and some darkened urine.  She denies any chest pain or abdominal pain.  Denies any headache or neck pain.  Denies any other discomfort.  Denies nausea, vomiting, or constipation does report some mild diarrhea.  Denies any rashes or trauma.  Resting comfortably.  On exam, lungs are coarse and chest is nontender.  Oxygen saturations reportedly about 90 to 92% on room air so she was placed on 2 L nasal cannula with improvement.  Abdomen was  nontender for me and she is moving all extremities.  Patient appears very fatigued and generalized weak.  She is somnolent but arousable to loud noise and voice.  Denies any neck pain or  neck stiffness.  Clinically I suspect patient has either pneumonia or UTI.  We will get x-ray and labs.  X-ray does show evidence of pneumonia.  Will give antibiotics.  She will be given some fluids as well.  We will give some Tylenol given the normal kidney function.  After urinalysis has been completed, will admit for further management.  9:28 PM Nursing reports the pressures have softened.  We will give more fluids.  We will give the 30 cc/kg.  We ordered Tylenol for her fever.  Urinalysis did not show convincing evidence of UTI.  Will admit for pneumonia.    {Document critical care time when appropriate:1} {Document review of labs and clinical decision tools ie heart score, Chads2Vasc2 etc:1}  {Document your independent review of radiology images, and any outside records:1} {Document your discussion with family members, caretakers, and with consultants:1} {Document social determinants of health affecting pt's care:1} {Document your decision making why or why not admission, treatments were needed:1} Final Clinical Impression(s) / ED Diagnoses Final diagnoses:  None    Rx / DC Orders ED Discharge Orders     None

## 2021-06-28 NOTE — H&P (Addendum)
History and Physical   Wanda Jones RUE:[id number] DOB: August 09, 1945 DOA: 06/28/2021  PCP: Roetta Sessions, NP   Patient coming from: [place]  Chief Complaint: Confusion  HPI: Wanda Jones is a 76 y.o. female with medical history significant of diabetes, sepsis, hyperlipidemia, dementia, malnutrition, depression, hypothyroidism presenting with weakness and altered mental status.  History obtained with assistance of family and chart review.  Patient states that she been feeling unwell for the past several days.  Noted to have increased confusion, cough, fatigue and chills.  Seen by PT today and noted to be significantly confused.  Sent via EMS, did receive a liter in route.  She denies chest pain, shortness of breath, abdominal pain, constipation, diarrhea, nausea, vomiting.  ED Course: Vital signs in the ED significant for temperature of 100.9, heart rate in the 90s to 100s, blood pressure in the 14N to 829F systolic, saturating 90 to 92% on room air.  Lab work-up included CMP which showed creatinine elevated to 1.25 which is up from baseline around 0.9, BUN 28, albumin 3.4.  CBC showed leukocytosis to 15.  PT and INR within normal limits.  Lactic acid initially elevated at 2.2 with repeat pending.  Respiratory panel for flu and COVID-negative.  Ammonia level normal.  TSH level pending.  Urinalysis showing few bacteria only.  Urine culture pending.  Chest x-ray showed patchy bilateral infiltrate right greater than left suspicious for pneumonia.  Patient started on ceftriaxone and azithromycin in the ED also received a dose of Tylenol and 2 L of IV fluids.  Review of Systems: As per HPI otherwise all other systems reviewed and are negative.  Past Medical History:  Diagnosis Date   Acute lower UTI (urinary tract infection) 04/11/2021   Cellulitis 01/02/2021   Dementia ([place]) 01/24/2021   Diabetes mellitus type 1 ([place])    History of recurrent UTIs    Hyperglycemia 01/02/2021    Hyperlipidemia 01/24/2021   Sepsis ([place]) 01/02/2021   UTI (urinary tract infection) 01/02/2021   Volume depletion 01/24/2021    History reviewed. No pertinent surgical history.  Social History  reports that she has never smoked. She does not have any smokeless tobacco history on file. She reports that she does not currently use alcohol. She reports that she does not use drugs.  Allergies  Allergen Reactions   Codeine Anaphylaxis   Prednisone Anaphylaxis    Family History  Problem Relation Age of Onset   Hypertension Other    Diabetes Neg Hx   Reviewed on admission  Prior to Admission medications   Medication Sig Start Date End Date Taking? Authorizing Provider  acetaminophen (TYLENOL) 500 MG tablet Take 500 mg by mouth every 6 (six) hours as needed for mild pain or headache.   Yes [provider]  aspirin EC 81 MG tablet Take 81 mg by mouth in the morning. Swallow whole.   Yes [provider]  clobetasol (TEMOVATE) 0.05 % external solution Apply 1 application. topically 2 (two) times daily as needed (for scalp irritation).   Yes [provider]  Continuous Blood Gluc Sensor (FREESTYLE LIBRE 2 SENSOR) MISC Inject 1 patch into the skin every 14 (fourteen) days.   Yes [provider]  donepezil (ARICEPT) 5 MG tablet Take 5 mg by mouth at bedtime.   Yes [provider]  insulin aspart (NOVOLOG FLEXPEN) 100 UNIT/ML FlexPen 3 times a day (just before each meal), 6-5-5 units. Patient taking differently: 5-6 Units See admin instructions. Inject 6 units into the skin  in the morning, 5 units in the afternoon, and 5 units in the evening 02/19/21  Yes Renato Shin, MD  insulin degludec (TRESIBA FLEXTOUCH) 100 UNIT/ML FlexTouch Pen Inject 8 Units into the skin daily. Patient taking differently: Inject 18 Units into the skin in the morning. 04/02/21  Yes Renato Shin, MD  levothyroxine (SYNTHROID) 88 MCG tablet Take 88 mcg by mouth daily before breakfast.    Yes [provider]  Multiple Vitamins-Minerals (MULTIVITAMIN ADULTS) TABS Take 1 tablet by mouth at bedtime.   Yes [provider]  MYRBETRIQ 50 MG TB24 tablet Take 50 mg by mouth daily. 11/27/20  Yes [provider]  rosuvastatin (CRESTOR) 5 MG tablet Take 1 tablet (5 mg total) by mouth at bedtime. 04/22/21  Yes Dwyane Dee, MD  trimethoprim (TRIMPEX) 100 MG tablet Take 100 mg by mouth at bedtime.   Yes [provider]  venlafaxine XR (EFFEXOR-XR) 150 MG 24 hr capsule Take 150 mg by mouth daily with breakfast.   Yes [provider]  vitamin B-12 (CYANOCOBALAMIN) 1000 MCG tablet Take 1,000 mcg by mouth in the morning.   Yes [provider]  Continuous Blood Gluc Receiver (FREESTYLE LIBRE 2 READER) DEVI Use as directed. E10.69 03/17/21   Renato Shin, MD  memantine (NAMENDA) 5 MG tablet Take 5 mg by mouth 2 (two) times daily. Patient not taking: Reported on 06/28/2021    [provider]  Semaglutide,0.25 or 0.'5MG'$ /DOS, (OZEMPIC, 0.25 OR 0.5 MG/DOSE,) 2 MG/1.5ML SOPN Inject 0.5 mg into the skin once a week. Patient not taking: Reported on 06/28/2021 04/02/21   Renato Shin, MD    Physical Exam: Vitals:   06/28/21 2115 06/28/21 2123 06/28/21 2145 06/28/21 2148  BP: (!) 88/62 (!) 94/49 (!) 101/52   Pulse: 100 97 95   Resp: '17 20 20   '$ Temp:    100 F (37.8 C)  TempSrc:    Oral  SpO2: 99% 96% 98%   Weight:      Height:        Physical Exam Constitutional:      General: She is not in acute distress.    Appearance: Normal appearance. She is ill-appearing.     Comments: Drowsy, lethargic when seen  HENT:     Head: Normocephalic and atraumatic.     Mouth/Throat:     Mouth: Mucous membranes are moist.     Pharynx: Oropharynx is clear.  Eyes:     Extraocular Movements: Extraocular movements intact.     Pupils: Pupils are equal, round, and reactive to light.  Cardiovascular:     Rate and Rhythm: Regular rhythm. Bradycardia  present.     Pulses: Normal pulses.     Heart sounds: Normal heart sounds.  Pulmonary:     Effort: Pulmonary effort is normal. No respiratory distress.     Breath sounds: Rhonchi present.  Abdominal:     General: Bowel sounds are normal. There is no distension.     Palpations: Abdomen is soft.     Tenderness: There is no abdominal tenderness.  Musculoskeletal:        General: No swelling or deformity.  Skin:    General: Skin is warm and dry.  Neurological:     General: No focal deficit present.     Comments: Drowsy, lethargic when seen   Labs on Admission: I have personally reviewed following labs and imaging studies  CBC: Recent Labs  Lab 06/28/21 1817  WBC 15.0*  NEUTROABS 13.0*  HGB 12.0  HCT 36.9  MCV 89.3  PLT 202    Basic Metabolic Panel: Recent Labs  Lab 06/28/21 1817  NA 137  K 4.2  CL 102  CO2 28  GLUCOSE 107*  BUN 28*  CREATININE 1.25*  CALCIUM 9.3    GFR: Estimated Creatinine Clearance: 32.4 mL/min (A) (by C-G formula based on SCr of 1.25 mg/dL (H)).  Liver Function Tests: Recent Labs  Lab 06/28/21 1817  AST 22  ALT 19  ALKPHOS 106  BILITOT 0.3  PROT 6.7  ALBUMIN 3.4*    Urine analysis:    Component Value Date/Time   COLORURINE YELLOW 06/28/2021 2042   APPEARANCEUR CLEAR 06/28/2021 2042   LABSPEC 1.018 06/28/2021 2042   PHURINE 5.0 06/28/2021 2042   GLUCOSEU >=500 (A) 06/28/2021 2042   HGBUR NEGATIVE 06/28/2021 2042   BILIRUBINUR NEGATIVE 06/28/2021 2042   KETONESUR NEGATIVE 06/28/2021 2042   PROTEINUR NEGATIVE 06/28/2021 2042   NITRITE NEGATIVE 06/28/2021 2042   LEUKOCYTESUR NEGATIVE 06/28/2021 2042    Radiological Exams on Admission: DG Chest 2 View  Result Date: 06/28/2021 CLINICAL DATA:  Increased confusion, history dementia, diabetes mellitus, hyperlipidemia EXAM: CHEST - 2 VIEW COMPARISON:  04/10/2021 FINDINGS: Enlargement of cardiac silhouette. Mediastinal contours and pulmonary vascularity normal. Patchy infiltrates  bilaterally greater in RIGHT upper lobe and LEFT lower lobe favoring multifocal pneumonia. No definite pleural effusion or pneumothorax. Bones demineralized. IMPRESSION: Patchy BILATERAL pulmonary infiltrates greater in RIGHT upper lobe and LEFT lower lobe question of multifocal pneumonia. Electronically Signed   By: Lavonia Dana M.D.   On: 06/28/2021 18:44    EKG: Independently reviewed.  Sinus tachycardia at 106 bpm.  Low voltage in multiple leads.  Similar to previous.  Assessment/Plan Principal Problem:   Sepsis due to pneumonia [facility]) Active Problems:   Type 2 diabetes mellitus ([place])   Hyperlipidemia   Dementia ([place])   Acute encephalopathy   Depression   Acute encephalopathy Sepsis due to pneumonia > Patient presenting with weakness and altered mental status after feeling well for several days. > Found to have SIRS criteria of fever of 100.9, tachycardia, leukocytosis to 15.  With source of pneumonia on chest x-ray. > Patient has history of confusion with sepsis and UTIs.  No evidence of UTI at this time. > Blood pressure initially was okay but has been dropping despite receiving fluids in the ED now in the 54Y systolic with maps intermittently dipping below 65.  Additional liter fluids ordered in the ED. - Monitor on stepdown unit as blood pressure has been trending down. - Continue ceftriaxone and azithromycin - Trend fever curve and white count - Follow-up urine culture - Follow-up blood culture - Trend lactic acid - A.m. cortisol and procalcitonin  AKI > Creatinine elevated to 1.25 from baseline around 0.9. > Receiving IV fluids as above. - Continue IV fluids overnight - Trend renal function and electrolytes  Diabetes > Had a low earlier today per son - SSI for now  Hypothyroidism - Continue home Synthroid  Depression - Holding home venlafaxine in setting altered mental status  Dementia - Holding home donepezil in the setting of altered mental status - [facility] already on held outpatient due to lethargy  Hyperlipidemia - Continue home Crestor  DVT prophylaxis: Lovenox Code Status:   Full Family Communication:  Son updated by phone.  Has TTY service as he is limited hearing, disconnects automatically, and communication is simple and effective through this.  He states he will be here tomorrow. Disposition Plan:  Patient is from:  [place]  Anticipated DC to:  Same as above  Anticipated DC date:  2 to 7 days  Anticipated DC barriers: None  Consults called:  None Admission status:  Inpatient, stepdown  Severity of Illness: The appropriate patient status for this patient is INPATIENT. Inpatient status is judged to be reasonable and necessary in order to provide the required intensity of service to ensure the patient's safety. The patient's presenting symptoms, physical exam findings, and initial radiographic and laboratory data in the context of their chronic comorbidities is felt to place them at high risk for further clinical deterioration. Furthermore, it is not anticipated that the patient will be medically stable for discharge from the hospital within 2 midnights of admission.   * I certify that at the point of admission it is my clinical judgment that the patient will require inpatient hospital care spanning beyond 2 midnights from the point of admission due to high intensity of service, high risk for further deterioration and high frequency of surveillance required.Marcelyn Bruins MD [facility]  How to contact the [facility] Attending or Consulting provider [employer] or covering provider during after hours [place], for this patient?   Check the care team in [facility] and look for a) attending/consulting [facility] provider listed and b) the [facility] team listed Log into [email or web address] and use [place]'s universal password to access. If you do not have the password, please contact the hospital operator. Locate the [facility] provider you are looking  for under [facility] and page to a number that you can be directly reached. If you still have difficulty reaching the provider, please page the [facility] (Director on Call) for the Hospitalists listed on amion for assistance.  06/28/2021, 9:51 PM

## 2021-06-28 NOTE — ED Triage Notes (Signed)
Pt BIB [facility] from home after being found by PT around noon today. PT states that the patient was more confused than normal and has been fatigued. Pt is A&O x4 at baseline but is A&O x2 upon arrival. VSS. 1L LR with EMS. Pt has significant history or urosepsis. ?

## 2021-06-29 ENCOUNTER — Inpatient Hospital Stay (HOSPITAL_COMMUNITY): Payer: Medicare PPO

## 2021-06-29 DIAGNOSIS — R7989 Other specified abnormal findings of blood chemistry: Secondary | ICD-10-CM | POA: Diagnosis present

## 2021-06-29 LAB — COMPREHENSIVE METABOLIC PANEL
ALT: 19 U/L (ref 0–44)
AST: 23 U/L (ref 15–41)
Albumin: 3 g/dL — ABNORMAL LOW (ref 3.5–5.0)
Alkaline Phosphatase: 103 U/L (ref 38–126)
Anion gap: 8 (ref 5–15)
BUN: 22 mg/dL (ref 8–23)
CO2: 27 mmol/L (ref 22–32)
Calcium: 8.8 mg/dL — ABNORMAL LOW (ref 8.9–10.3)
Chloride: 106 mmol/L (ref 98–111)
Creatinine, Ser: 1 mg/dL (ref 0.44–1.00)
GFR, Estimated: 58 mL/min — ABNORMAL LOW (ref >60)
Glucose, Bld: 66 mg/dL — ABNORMAL LOW (ref 70–99)
Potassium: 4 mmol/L (ref 3.5–5.1)
Sodium: 141 mmol/L (ref 135–145)
Total Bilirubin: 0.3 mg/dL (ref 0.3–1.2)
Total Protein: 6.6 g/dL (ref 6.5–8.1)

## 2021-06-29 LAB — GLUCOSE, CAPILLARY
Glucose-Capillary: 121 mg/dL — ABNORMAL HIGH (ref 70–99)
Glucose-Capillary: 130 mg/dL — ABNORMAL HIGH (ref 70–99)
Glucose-Capillary: 201 mg/dL — ABNORMAL HIGH (ref 70–99)
Glucose-Capillary: 71 mg/dL (ref 70–99)
Glucose-Capillary: 76 mg/dL (ref 70–99)

## 2021-06-29 LAB — MRSA NEXT GEN BY PCR, NASAL: MRSA by PCR Next Gen: DETECTED — AB

## 2021-06-29 LAB — CBC
HCT: 38.4 % (ref 36.0–46.0)
Hemoglobin: 12.1 g/dL (ref 12.0–15.0)
MCH: 28.3 pg (ref 26.0–34.0)
MCHC: 31.5 g/dL (ref 30.0–36.0)
MCV: 89.9 fL (ref 80.0–100.0)
Platelets: 251 10*3/uL (ref 150–400)
RBC: 4.27 MIL/uL (ref 3.87–5.11)
RDW: 13.2 % (ref 11.5–15.5)
WBC: 13.7 10*3/uL — ABNORMAL HIGH (ref 4.0–10.5)
nRBC: 0 % (ref 0.0–0.2)

## 2021-06-29 LAB — PROTIME-INR
INR: 1.1 (ref 0.8–1.2)
Prothrombin Time: 13.7 seconds (ref 11.4–15.2)

## 2021-06-29 LAB — CORTISOL-AM, BLOOD: Cortisol - AM: 5.5 ug/dL — ABNORMAL LOW (ref 6.7–22.6)

## 2021-06-29 LAB — PROCALCITONIN: Procalcitonin: 0.24 ng/mL

## 2021-06-29 LAB — TSH: TSH: 0.212 u[IU]/mL — ABNORMAL LOW (ref 0.350–4.500)

## 2021-06-29 MED ORDER — LEVOTHYROXINE SODIUM 75 MCG PO TABS
75.0000 ug | ORAL_TABLET | Freq: Every day | ORAL | Status: DC
  Administered 2021-06-30: 75 ug via ORAL
  Filled 2021-06-29: qty 1

## 2021-06-29 MED ORDER — COSYNTROPIN 0.25 MG IJ SOLR
0.2500 mg | Freq: Once | INTRAMUSCULAR | Status: AC
  Administered 2021-06-30: 0.25 mg via INTRAVENOUS
  Filled 2021-06-29: qty 0.25

## 2021-06-29 NOTE — Assessment & Plan Note (Signed)
On Namenda.  Continue. ?

## 2021-06-29 NOTE — Assessment & Plan Note (Addendum)
Work-up on admission for encephalopathy shows a cortisol level of 5.5.  ?Cosyntropin stim test done today shows a Cortisol of 10.8 which has risen appropriately to 24.8 at 30 min. ?

## 2021-06-29 NOTE — Progress Notes (Signed)
?  Progress Note ? ? ?Patient: Wanda Jones AYO:[id number] DOB: 1945/11/08 DOA: 06/28/2021     Hospitalization day: 1 ?DOS: the patient was seen and examined on 06/29/2021 ? ?Brief hospital course: ?Naelle Diegel is a 76 y.o. female with medical history significant of diabetes, sepsis, hyperlipidemia, dementia, malnutrition, depression, hypothyroidism presenting with weakness and altered mental status. ?Work-up for encephalopathy is unremarkable.  Patient's mentation is improving. ?Chest x-ray shows concern for pneumonia currently receiving IV antibiotics. ? ?Assessment and Plan: ?* Sepsis due to pneumonia [facility]) ?Acute metabolic encephalopathy. ?Presents with complaints of confusion and altered sensorium. ?Found to have SIRS with fever, tachycardia and leukocytosis. ?Chest x-ray showing evidence of pneumonia. ?Started on IV antibiotics.  Encephalopathy improving. ?MRI brain negative for any acute stroke. ?Continue with antibiotics follow-up on cultures. ? ?AKI (acute kidney injury) ([place]) ?Baseline serum creatinine 0.8. ?On presentation serum creatinine 1.2. ?Meeting AKI criteria now improving.  Monitor. ? ?Type 2 diabetes mellitus without complication, with long-term current use of insulin ([place]) ?Uncontrolled with hyperglycemia.  Hemoglobin A1c 9.2 in November 2022. ?Currently on sliding scale insulin. ?Reportedly had a hypoglycemic event before coming to the hospital. ? ?Depression ?Continuing home regimen likely tomorrow. ? ?Low serum cortisol level ?Work-up on admission for encephalopathy shows severely low cortisol level. ?Patient reports that when she took prednisone it caused her to have an out of body experience but it was not causing her to have a rash, hives, throat closing experience or required hospitalization. ?Based on this history patient currently agreeable to receive cosyntropin stimulation test for further work-up of her low cortisol level. ? ?Low TSH level ?Has hypothyroidism but TSH is severely low.   We will reduce the dose of the Synthroid from 88 to 75 mcg. ? ?Dementia ([place]) ?On Namenda.  Continue. ? ?Hyperlipidemia ?Continue statin. ? ?Subjective: No nausea no vomiting no fever no chills.  Has some shortness of breath and fatigue. ? ?Physical Exam: ?Vitals:  ? 06/29/21 0900 06/29/21 1000 06/29/21 1135 06/29/21 1539  ?BP: (!) 147/73 (!) 152/70  (!) 168/87  ?Pulse: 95 89  87  ?Resp: '13 20  16  '$ ?Temp:   98.4 ?F (36.9 ?C) 98 ?F (36.7 ?C)  ?TempSrc:   Axillary Oral  ?SpO2: (!) 89% 100%  99%  ?Weight:      ?Height:      ? ?General: Appear in mild distress; no visible Abnormal Neck Mass Or lumps, Conjunctiva normal ?Cardiovascular: S1 and S2 Present, no Murmur, ?Respiratory: good respiratory effort, Bilateral Air entry present and CTA, no Crackles, no wheezes ?Abdomen: Bowel Sound present, nontender ?Extremities: no Pedal edema ?Neurology: alert and oriented to time, place, and person ?Gait not checked due to patient safety concerns  ? ?Data Reviewed: ?I have Reviewed nursing notes, Vitals, and Lab results since pt's last encounter. Pertinent lab results CBC and BMP ?I have ordered test including CBC, BMP, cosyntropin stimulation test ?I have ordered imaging studies MRI brain.  ? ?Family Communication: Attempted to reach the son, went to a voicemail, have not left a Advertising account executive.  Son has limited hearing and uses an answering service. ? ?Disposition: ?Status is: Inpatient ?Remains inpatient appropriate because: Ongoing work-up for low cortisol level as well as pneumonia. ? ?Author: ?Berle Mull, MD ?06/29/2021 6:12 PM ? ?For on call review [email or web address]. ?

## 2021-06-29 NOTE — Assessment & Plan Note (Addendum)
With resultant acute metabolic encephalopathy. ?Started on Azithromycin and Ceftriaxone- will prescribe a total of 7 days ?MRI brain negative for any acute stroke. ?Blood cultures negative. ?

## 2021-06-29 NOTE — Progress Notes (Signed)
Phlebotomy team called asking for coritsol labs to be drawn after morning collection.  Stated time was 0730. Medication will be moved to 0730.  ?

## 2021-06-29 NOTE — Progress Notes (Addendum)
?  Transition of Care (TOC) Screening Note ? ? ?Patient Details  ?Name: Wanda Jones ?Date of Birth: [birth year] ? ? ?Transition of Care (TOC) CM/SW Contact:    ?Bedelia Pong, LCSW ?Phone Number: ?06/29/2021, 1:20 PM ? ? ? ?Transition of Care Department [facility]) has reviewed patient and no TOC needs have been identified at this time. We will continue to monitor patient advancement through interdisciplinary progression rounds. If new patient transition needs arise, please place a TOC consult. ? ?**please note [place] is from [place] ? ? ?

## 2021-06-29 NOTE — Assessment & Plan Note (Addendum)
Baseline serum creatinine 0.8. ?On presentation serum creatinine 1.2. ?Given 2 L NS bolus in ED and then started on LR at 100 cc/hr ?Has improved to 0.88 today ?

## 2021-06-29 NOTE — Assessment & Plan Note (Addendum)
Uncontrolled with hyperglycemia.   ?- sugars are high as long acting insulin was on hold- I have resumed it today ?- cont outpt meds ? ?Hemoglobin A1C ?   ?Component Value Date/Time  ? HGBA1C 9.2 (A) 02/19/2021 1410  ? HGBA1C 9.6 (H) 01/03/2021 0030  ? ? ?

## 2021-06-29 NOTE — Progress Notes (Signed)
Re-assessment done when patient arrived to the unit from ICU. Agree with previous assessment. ?

## 2021-06-29 NOTE — Assessment & Plan Note (Addendum)
Continuing home regimen   ?

## 2021-06-29 NOTE — Assessment & Plan Note (Signed)
Continue statin. 

## 2021-06-29 NOTE — Assessment & Plan Note (Addendum)
TSH checked and found to be 0.212 ?- Dr [name], my colleague has reduced the dose of the Synthroid from 88 to 75 mcg. ?

## 2021-06-29 NOTE — Hospital Course (Addendum)
Wanda Jones is a 76 y.o. female with medical history significant of diabetes, sepsis, hyperlipidemia, dementia, malnutrition, depression, hypothyroidism presenting with weakness and altered mental status. ?CXR > Patchy BILATERAL pulmonary infiltrates greater in RIGHT upper lobe and LEFT lower lobe question of multifocal pneumonia. ?  ?Work-up for encephalopathy is unremarkable and might have been secondary to pneumonia.  ?

## 2021-06-30 LAB — ACTH STIMULATION, 3 TIME POINTS
Cortisol, 30 Min: 24.8 ug/dL
Cortisol, 60 Min: 28.2 ug/dL
Cortisol, Base: 10.8 ug/dL

## 2021-06-30 LAB — BASIC METABOLIC PANEL
Anion gap: 12 (ref 5–15)
BUN: 18 mg/dL (ref 8–23)
CO2: 22 mmol/L (ref 22–32)
Calcium: 8.8 mg/dL — ABNORMAL LOW (ref 8.9–10.3)
Chloride: 100 mmol/L (ref 98–111)
Creatinine, Ser: 0.88 mg/dL (ref 0.44–1.00)
GFR, Estimated: >60 mL/min (ref >60)
Glucose, Bld: 297 mg/dL — ABNORMAL HIGH (ref 70–99)
Potassium: 4.2 mmol/L (ref 3.5–5.1)
Sodium: 134 mmol/L — ABNORMAL LOW (ref 135–145)

## 2021-06-30 LAB — URINE CULTURE: Culture: NO GROWTH

## 2021-06-30 LAB — CBC
HCT: 35.3 % — ABNORMAL LOW (ref 36.0–46.0)
Hemoglobin: 11.6 g/dL — ABNORMAL LOW (ref 12.0–15.0)
MCH: 29.1 pg (ref 26.0–34.0)
MCHC: 32.9 g/dL (ref 30.0–36.0)
MCV: 88.5 fL (ref 80.0–100.0)
Platelets: 264 10*3/uL (ref 150–400)
RBC: 3.99 MIL/uL (ref 3.87–5.11)
RDW: 12.9 % (ref 11.5–15.5)
WBC: 9.3 10*3/uL (ref 4.0–10.5)
nRBC: 0 % (ref 0.0–0.2)

## 2021-06-30 LAB — GLUCOSE, CAPILLARY
Glucose-Capillary: 380 mg/dL — ABNORMAL HIGH (ref 70–99)
Glucose-Capillary: 287 mg/dL — ABNORMAL HIGH (ref 70–99)
Glucose-Capillary: 280 mg/dL — ABNORMAL HIGH (ref 70–99)
Glucose-Capillary: 299 mg/dL — ABNORMAL HIGH (ref 70–99)

## 2021-06-30 MED ORDER — CEFUROXIME AXETIL 500 MG PO TABS: 500.0000 mg | ORAL_TABLET | Freq: Two times a day (BID) | ORAL | 0 refills | Status: AC

## 2021-06-30 MED ORDER — AZITHROMYCIN 250 MG PO TABS: 500.0000 mg | ORAL_TABLET | Freq: Every day | ORAL | Status: DC

## 2021-06-30 MED ORDER — INSULIN DEGLUDEC 100 UNIT/ML ~~LOC~~ SOPN: 10.0000 [IU] | PEN_INJECTOR | Freq: Every day | SUBCUTANEOUS | Status: DC

## 2021-06-30 MED ORDER — AZITHROMYCIN 250 MG PO TABS: 250.0000 mg | ORAL_TABLET | Freq: Every day | ORAL | 0 refills | Status: AC

## 2021-06-30 MED ORDER — VENLAFAXINE HCL ER 150 MG PO CP24: 150.0000 mg | ORAL_CAPSULE | Freq: Every day | ORAL | Status: DC

## 2021-06-30 MED ORDER — INSULIN GLARGINE-YFGN 100 UNIT/ML ~~LOC~~ SOLN
10.0000 [IU] | Freq: Every day | SUBCUTANEOUS | Status: DC
  Administered 2021-06-30: 10 [IU] via SUBCUTANEOUS
  Filled 2021-06-30: qty 0.1

## 2021-06-30 MED ORDER — ACETAMINOPHEN 325 MG PO TABS
650.0000 mg | ORAL_TABLET | Freq: Four times a day (QID) | ORAL | Status: AC | PRN
Start: 2021-06-30 — End: ?

## 2021-06-30 NOTE — TOC Transition Note (Signed)
Transition of Care (TOC) - CM/SW Discharge Note ? ? ?Patient Details  ?Name: Wanda Jones ?MRN: [id number] ?Date of Birth: January 22, 1946 ? ?Transition of Care (TOC) CM/SW Contact:  ?[place], LCSW ?Phone Number: ?06/30/2021, 1:58 PM ? ? ?Clinical Narrative:    ? ?CSW called [facility] and made notification that pt is Dcing today. [facility] needs fl2 and dc summary faxed to [phone]. ALF does not provide transportation and inform CSW that family would need to transport.  ? ?CSW met with pt and [name] bedside. Notified them that pt would DC back to [facility]. Pt son stated he would transport. RN notified.  ? ?Final next level of care: Assisted Living ?Barriers to Discharge: No Barriers Identified ? ? ?Patient Goals and CMS Choice ?  ?  ?  ? ?Discharge Placement ?  ?           ?  ?Patient to be transferred to facility by: [name] ?Name of family member notified: [name] ?Patient and family notified of of transfer: 06/30/21 ? ?Discharge Plan and Services ?  ?  ?           ?  ?  ?  ?  ?  ?  ?  ?  ?  ?  ? ?Social Determinants of Health (SDOH) Interventions ?  ? ? ?Readmission Risk Interventions ?Readmission Risk Prevention Plan 01/04/2021  ?Post Dischage Appt Complete  ?Medication Screening Complete  ?Transportation Screening Complete  ? ? ? ? ? ?

## 2021-06-30 NOTE — Discharge Summary (Signed)
Physician Discharge Summary  ?Wanda Jones NUU:[id number] DOB: 11-Nov-1945 DOA: 06/28/2021 ? ?PCP: Roetta Sessions, NP ? ?Admit date: 06/28/2021 ?Discharge date: 06/30/2021 ?Discharging to: back to [facility] ?Recommendations for Outpatient Follow-up:  ?Please f/u on TSH ? ?Consults:  ?none ?Procedures:  ?none ? ? ?Discharge Diagnoses:  ? Principal Problem: ?  Sepsis due to pneumonia [facility]) ?Active Problems: ?  Acute metabolic encephalopathy ?  AKI (acute kidney injury) (Wanda Jones) ?  Type 2 diabetes mellitus without complication, with long-term current use of insulin ([name]) ?  Depression ?  Low TSH level ?  Hyperlipidemia ?  Dementia ([name]) ? ? ? ? ?Hospital Course: ?Wanda Jones is a 76 y.o. female with medical history significant of diabetes, sepsis, hyperlipidemia, dementia, malnutrition, depression, hypothyroidism presenting with weakness and altered mental status. ?CXR > Patchy BILATERAL pulmonary infiltrates greater in RIGHT upper lobe and LEFT lower lobe question of multifocal pneumonia. ?  ?Work-up for encephalopathy is unremarkable and might have been secondary to pneumonia.  ?  ? ?Assessment and Plan: ?* Sepsis due to pneumonia [facility]) ?With resultant acute metabolic encephalopathy. ?Started on Azithromycin and Ceftriaxone- will prescribe a total of 7 days ?MRI brain negative for any acute stroke. ?Blood cultures negative. ? ?AKI (acute kidney injury) (Wanda Jones) ?Baseline serum creatinine 0.8. ?On presentation serum creatinine 1.2. ?Given 2 L NS bolus in ED and then started on LR at 100 cc/hr ?Has improved to 0.88 today ? ?Type 2 diabetes mellitus without complication, with long-term current use of insulin (Wanda Jones) ?Uncontrolled with hyperglycemia.   ?- sugars are high as long acting insulin was on hold- I have resumed it today ?- cont outpt meds ? ?Hemoglobin A1C ?   ?Component Value Date/Time  ? HGBA1C 9.2 (A) 02/19/2021 1410  ? HGBA1C 9.6 (H) 01/03/2021 0030  ? ? ? ?Depression ?Continuing home regimen   ? ?Low  serum cortisol level-resolved as of 06/30/2021 ?Work-up on admission for encephalopathy shows a cortisol level of 5.5.  ?Cosyntropin stim test done today shows a Cortisol of 10.8 which has risen appropriately to 24.8 at 30 min. ? ?Low TSH level ?TSH checked and found to be 0.212 ?- Dr Wanda Jones, my colleague has reduced the dose of the Synthroid from 88 to 75 mcg. ? ?Dementia (Wanda Jones) ?On Namenda.  Continue. ? ?Hyperlipidemia ?Continue statin. ? ? ? ? ? ?  ? ? ?  ? ?Discharge Instructions ? ?Discharge Instructions   ? ? Diet - low sodium heart healthy   Complete by: As directed ?  ? Increase activity slowly   Complete by: As directed ?  ? No wound care   Complete by: As directed ?  ? ?  ? ?Allergies as of 06/30/2021   ? ?   Reactions  ? Codeine Anaphylaxis  ? Prednisone Anaphylaxis  ? ?  ? ?  ?Medication List  ?  ? ?TAKE these medications   ? ?acetaminophen 325 MG tablet ?Commonly known as: TYLENOL ?Take 2 tablets (650 mg total) by mouth every 6 (six) hours as needed for mild pain (or Fever >/= 101). ?What changed:  ?medication strength ?how much to take ?reasons to take this ?  ?aspirin EC 81 MG tablet ?Take 81 mg by mouth in the morning. Swallow whole. ?  ?azithromycin 250 MG tablet ?Commonly known as: ZITHROMAX ?Take 1 tablet (250 mg total) by mouth daily for 5 days. ?  ?cefUROXime 500 MG tablet ?Commonly known as: CEFTIN ?Take 1 tablet (500 mg total) by mouth 2 (two) times daily  with a meal for 5 days. ?  ?clobetasol 0.05 % external solution ?Commonly known as: TEMOVATE ?Apply 1 application. topically 2 (two) times daily as needed (for scalp irritation). ?  ?donepezil 5 MG tablet ?Commonly known as: ARICEPT ?Take 5 mg by mouth at bedtime. ?  ?FreeStyle [place] 2 Reader Wanda Jones ?Use as directed. E10.69 ?  ?FreeStyle Libre 2 Sensor Misc ?Inject 1 patch into the skin every 14 (fourteen) days. ?  ?levothyroxine 88 MCG tablet ?Commonly known as: SYNTHROID ?Take 88 mcg by mouth daily before breakfast. ?  ?memantine 5 MG  tablet ?Commonly known as: NAMENDA ?Take 5 mg by mouth 2 (two) times daily. ?  ?Multivitamin Adults Tabs ?Take 1 tablet by mouth at bedtime. ?  ?Myrbetriq 50 MG Tb24 tablet ?Generic drug: mirabegron ER ?Take 50 mg by mouth daily. ?  ?NovoLOG FlexPen 100 UNIT/ML FlexPen ?Generic drug: insulin aspart ?3 times a day (just before each meal), 6-5-5 units. ?What changed:  ?how much to take ?when to take this ?additional instructions ?  ?Ozempic (0.25 or 0.5 MG/DOSE) 2 MG/1.5ML Sopn ?Generic drug: Semaglutide(0.25 or 0.'5MG'$ /DOS) ?Inject 0.5 mg into the skin once a week. ?  ?rosuvastatin 5 MG tablet ?Commonly known as: CRESTOR ?Take 1 tablet (5 mg total) by mouth at bedtime. ?  ?[name] FlexTouch 100 UNIT/ML FlexTouch Pen ?Generic drug: insulin degludec ?Inject 8 Units into the skin daily. ?What changed:  ?how much to take ?when to take this ?  ?trimethoprim 100 MG tablet ?Commonly known as: TRIMPEX ?Take 100 mg by mouth at bedtime. ?  ?venlafaxine XR 150 MG 24 hr capsule ?Commonly known as: EFFEXOR-XR ?Take 150 mg by mouth daily with breakfast. ?  ?vitamin B-12 1000 MCG tablet ?Commonly known as: CYANOCOBALAMIN ?Take 1,000 mcg by mouth in the morning. ?  ? ?  ? ? ?  ?  ?The results of significant diagnostics from this hospitalization (including imaging, microbiology, ancillary and laboratory) are listed below for reference.   ? ?DG Chest 2 View ? ?Result Date: 06/28/2021 ?CLINICAL DATA:  Increased confusion, history dementia, diabetes mellitus, hyperlipidemia EXAM: CHEST - 2 VIEW COMPARISON:  04/10/2021 FINDINGS: Enlargement of cardiac silhouette. Mediastinal contours and pulmonary vascularity normal. Patchy infiltrates bilaterally greater in RIGHT upper lobe and LEFT lower lobe favoring multifocal pneumonia. No definite pleural effusion or pneumothorax. Bones demineralized. IMPRESSION: Patchy BILATERAL pulmonary infiltrates greater in RIGHT upper lobe and LEFT lower lobe question of multifocal pneumonia. Electronically  Signed   By: Lavonia Dana M.D.   On: 06/28/2021 18:44  ? ?MR BRAIN WO CONTRAST ? ?Result Date: 06/29/2021 ?CLINICAL DATA:  Neuro deficit, acute, stroke suspected.  Confusion. EXAM: MRI HEAD WITHOUT CONTRAST TECHNIQUE: Multiplanar, multiecho pulse sequences of the brain and surrounding structures were obtained without intravenous contrast. COMPARISON:  None. FINDINGS: Brain: There is no evidence of an acute infarct, intracranial hemorrhage, mass, midline shift, or extra-axial fluid collection. Patchy T2 hyperintensities in the cerebral white matter, deep gray nuclei, and pons are nonspecific but compatible with moderate to severe chronic small vessel ischemic disease. Dilated perivascular spaces are present throughout the basal ganglia. There is moderate cerebral atrophy. Vascular: Major intracranial vascular flow voids are preserved. Skull and upper cervical spine: Mild nonspecific diffuse bone marrow heterogeneity. No destructive skull lesion. Sinuses/Orbits: Bilateral cataract extraction. Mild mucosal thickening in the paranasal sinuses. Trace right mastoid fluid. Other: None. IMPRESSION: 1. No acute intracranial abnormality. 2. Moderate to severe chronic small vessel ischemic disease and moderate cerebral atrophy. Electronically Signed   By:  Logan Bores M.D.   On: 06/29/2021 14:53   ?Labs: ?BNP (last 3 results) ?Recent Labs  ?  09/29/20 ?1833  ?BNP 100.8*  ? ?Basic Metabolic Panel: ?Recent Labs  ?Lab 06/28/21 ?1817 06/29/21 ?5284 06/30/21 ?0725  ?NA 137 141 134*  ?K 4.2 4.0 4.2  ?CL 102 106 100  ?CO2 '28 27 22  '$ ?GLUCOSE 107* 66* 297*  ?BUN 28* 22 18  ?CREATININE 1.25* 1.00 0.88  ?CALCIUM 9.3 8.8* 8.8*  ? ?Liver Function Tests: ?Recent Labs  ?Lab 06/28/21 ?1817 06/29/21 ?1324  ?AST 22 23  ?ALT 19 19  ?ALKPHOS 106 103  ?BILITOT 0.3 0.3  ?PROT 6.7 6.6  ?ALBUMIN 3.4* 3.0*  ? ?No results for input(s): LIPASE, AMYLASE in the last 168 hours. ?Recent Labs  ?Lab 06/28/21 ?1930  ?AMMONIA 20  ? ?CBC: ?Recent Labs  ?Lab  06/28/21 ?1817 06/29/21 ?4010 06/30/21 ?0725  ?WBC 15.0* 13.7* 9.3  ?NEUTROABS 13.0*  --   --   ?HGB 12.0 12.1 11.6*  ?HCT 36.9 38.4 35.3*  ?MCV 89.3 89.9 88.5  ?PLT 257 251 264  ? ?Cardiac Enzymes: ?No results for input(s)

## 2021-06-30 NOTE — NC FL2 (Signed)
?[place] MEDICAID FL2 LEVEL OF CARE SCREENING TOOL  ?  ? ?IDENTIFICATION  ?Patient Name: ?Wanda Jones Birthdate: 08/28/1945 Sex: female Admission Date (Current Location): ?06/28/2021  ?[place] and [place] Number: ? [place] ?  Facility and Address:  ?[facility], [place] [address] [place], [place] [address] ?     Provider Number: ?[id number]  ?Attending Physician Name and Address:  ?Debbe Odea, MD ? Relative Name and Phone Number:  ?Spindler,Chris (Son)   [phone] [facility]) ?   ?Current Level of Care: ?Hospital Recommended Level of Care: ?Assisted Living Facility Prior Approval Number: ?  ? ?Date Approved/Denied: ?  PASRR Number: ?  ? ?Discharge Plan: ?Other (Comment) (ALF) ?  ? ?Current Diagnoses: ?Patient Active Problem List  ? Diagnosis Date Noted  ? Low TSH level 06/29/2021  ? Sepsis due to pneumonia ([place]) 06/28/2021  ? Sleep apnea 04/11/2021  ? Depression 04/11/2021  ? Incidental finding of COVID-19 virus infection 04/11/2021  ? Pressure injury of skin 03/07/2021  ? Weakness 03/06/2021  ? Hyperlipidemia 01/24/2021  ? Dementia ([place]) 01/24/2021  ? Acute metabolic encephalopathy 36/62/9476  ? Mild protein malnutrition ([place]) 01/24/2021  ? AKI (acute kidney injury) ([place]) 01/02/2021  ? Type 2 diabetes mellitus without complication, with long-term current use of insulin ([place]) 01/02/2021  ? ? ?Orientation RESPIRATION BLADDER Height & Weight   ?  ?Self, Place ? Normal Incontinent Weight: 137 lb 2 oz (62.2 kg) ?Height:  '5\' 3"'$  (160 cm)  ?BEHAVIORAL SYMPTOMS/MOOD NEUROLOGICAL BOWEL NUTRITION STATUS  ?    Continent Diet (Regular)  ?AMBULATORY STATUS COMMUNICATION OF NEEDS Skin   ?Supervision Verbally Other (Comment) (Moisture associated skin damage buttocks) ?  ?  ?  ?    ?     ?     ? ? ?Personal Care Assistance Level of Assistance  ?Bathing, Feeding, Dressing Bathing Assistance: Limited assistance ?Feeding assistance: Independent ?Dressing Assistance: Limited assistance ?   ? ?Functional  Limitations Info  ?Sight, Speech, Hearing Sight Info: Adequate ?Hearing Info: Adequate ?Speech Info: Adequate  ? ? ?SPECIAL CARE FACTORS FREQUENCY  ?    ?  ?  ?  ?  ?  ?  ?   ? ? ?Contractures Contractures Info: Not present  ? ? ?Additional Factors Info  ?Code Status, Allergies Code Status Info: Full Code ?Allergies Info: codeine, prednisone ?  ?  ?  ?   ? ? ? ? ?Discharge Medications: ?Medication List  ?   ?  ?TAKE these medications   ?  ?acetaminophen 325 MG tablet ?Commonly known as: TYLENOL ?Take 2 tablets (650 mg total) by mouth every 6 (six) hours as needed for mild pain (or Fever >/= 101). ?What changed:  ?medication strength ?how much to take ?reasons to take this ?   ?aspirin EC 81 MG tablet ?Take 81 mg by mouth in the morning. Swallow whole. ?   ?azithromycin 250 MG tablet ?Commonly known as: ZITHROMAX ?Take 1 tablet (250 mg total) by mouth daily for 5 days. ?   ?cefUROXime 500 MG tablet ?Commonly known as: CEFTIN ?Take 1 tablet (500 mg total) by mouth 2 (two) times daily with a meal for 5 days. ?   ?clobetasol 0.05 % external solution ?Commonly known as: TEMOVATE ?Apply 1 application. topically 2 (two) times daily as needed (for scalp irritation). ?   ?donepezil 5 MG tablet ?Commonly known as: ARICEPT ?Take 5 mg by mouth at bedtime. ?   ?FreeStyle [place] 2 Reader [name] ?Use as directed.  E10.69 ?   ?FreeStyle Libre 2 Sensor Misc ?Inject 1 patch into the skin every 14 (fourteen) days. ?   ?levothyroxine 88 MCG tablet ?Commonly known as: SYNTHROID ?Take 88 mcg by mouth daily before breakfast. ?   ?memantine 5 MG tablet ?Commonly known as: NAMENDA ?Take 5 mg by mouth 2 (two) times daily. ?   ?Multivitamin Adults Tabs ?Take 1 tablet by mouth at bedtime. ?   ?Myrbetriq 50 MG Tb24 tablet ?Generic drug: mirabegron ER ?Take 50 mg by mouth daily. ?   ?NovoLOG FlexPen 100 UNIT/ML FlexPen ?Generic drug: insulin aspart ?3 times a day (just before each meal), 6-5-5 units. ?What changed:  ?how much to take ?when to take  this ?additional instructions ?   ?Ozempic (0.25 or 0.5 MG/DOSE) 2 MG/1.5ML Sopn ?Generic drug: Semaglutide(0.25 or 0.'5MG'$ /DOS) ?Inject 0.5 mg into the skin once a week. ?   ?rosuvastatin 5 MG tablet ?Commonly known as: CRESTOR ?Take 1 tablet (5 mg total) by mouth at bedtime. ?   ?[name] FlexTouch 100 UNIT/ML FlexTouch Pen ?Generic drug: insulin degludec ?Inject 8 Units into the skin daily. ?What changed:  ?how much to take ?when to take this ?   ?trimethoprim 100 MG tablet ?Commonly known as: TRIMPEX ?Take 100 mg by mouth at bedtime. ?   ?venlafaxine XR 150 MG 24 hr capsule ?Commonly known as: EFFEXOR-XR ?Take 150 mg by mouth daily with breakfast. ?   ?vitamin B-12 1000 MCG tablet ?Commonly known as: CYANOCOBALAMIN ?Take 1,000 mcg by mouth in the morning. ?   ? ? ?Relevant Imaging Results: ? ?Relevant Lab Results: ? ? ?Additional Information ?SSN [id number] ? ?[place], LCSW ? ? ? ? ?

## 2021-06-30 NOTE — TOC Initial Note (Signed)
Transition of Care (TOC) - Initial/Assessment Note  ? ? ?Patient Details  ?Name: Wanda Jones ?MRN: [id number] ?Date of Birth: [birth year] ? ?Transition of Care (TOC) CM/SW Contact:    ?[place], LCSW ?Phone Number: ?06/30/2021, 10:28 AM ? ?Clinical Narrative:                 ? ?CSW called [facility] and left message requesting call back regarding possible DC today.  ? ? ?Expected Discharge Plan: Assisted Living ?  ? ? ?Patient Goals and CMS Choice ?  ?  ?  ? ?Expected Discharge Plan and Services ?Expected Discharge Plan: Assisted Living ?  ?  ?  ?  ?                ?  ?  ?  ?  ?  ?  ?  ?  ?  ?  ? ?Prior Living Arrangements/Services ?  ?  ?  ?       ?  ?  ?  ?  ? ?Activities of Daily Living ?Home Assistive Devices/Equipment: Other (Comment) (has freestyle libre 2 at bedside, unable to name what equipment she uses. Per previous hx pt has grab bars around toilet, grab bars in shower, hh shower hose, shower chair) ?ADL Screening (condition at time of admission) ?Patient's cognitive ability adequate to safely complete daily activities?: No ?Is the patient deaf or have difficulty hearing?: No ?Does the patient have difficulty seeing, even when wearing glasses/contacts?: No ?Does the patient have difficulty concentrating, remembering, or making decisions?: Yes ?Patient able to express need for assistance with ADLs?: No ?Does the patient have difficulty dressing or bathing?: Yes ?Independently performs ADLs?: No ?Communication: Needs assistance ?Is this a change from baseline?: Pre-admission baseline ?Dressing (OT): Needs assistance ?Is this a change from baseline?: Pre-admission baseline ?Grooming: Needs assistance ?Is this a change from baseline?: Pre-admission baseline ?Feeding: Needs assistance ?Is this a change from baseline?: Pre-admission baseline ?Bathing: Needs assistance ?Is this a change from baseline?: Pre-admission baseline ?Toileting: Needs assistance ?Is this a change from baseline?:  Pre-admission baseline ?In/Out Bed: Needs assistance ?Is this a change from baseline?: Pre-admission baseline ?Walks in Home: Needs assistance ?Is this a change from baseline?: Pre-admission baseline ?Does the patient have difficulty walking or climbing stairs?: Yes ?Weakness of Legs: Both ?Weakness of Arms/Hands: Both ? ?Permission Sought/Granted ?  ?  ?   ?   ?   ?   ? ?Emotional Assessment ?  ?  ?  ?  ?  ?  ? ?Admission diagnosis:  Somnolence [R40.0] ?Sepsis due to pneumonia ([place]) [J18.9, A41.9] ?Fatigue, unspecified type [R53.83] ?Community acquired pneumonia, unspecified laterality [J18.9] ?Patient Active Problem List  ? Diagnosis Date Noted  ? Low serum cortisol level 06/29/2021  ? Low TSH level 06/29/2021  ? Sepsis due to pneumonia ([place]) 06/28/2021  ? Sleep apnea 04/11/2021  ? Depression 04/11/2021  ? Incidental finding of COVID-19 virus infection 04/11/2021  ? Pressure injury of skin 03/07/2021  ? Weakness 03/06/2021  ? Hyperlipidemia 01/24/2021  ? Dementia ([place]) 01/24/2021  ? Acute metabolic encephalopathy 30/86/5784  ? Mild protein malnutrition ([place]) 01/24/2021  ? AKI (acute kidney injury) ([place]) 01/02/2021  ? Type 2 diabetes mellitus without complication, with long-term current use of insulin ([place]) 01/02/2021  ? ?PCP:  Roetta Sessions, NP ?Pharmacy:   ?[place], [place] - [address] ?[address] [place] [address] ?Phone: [phone] Fax: [phone] ? ? ? ? ?  Social Determinants of Health (SDOH) Interventions ?  ? ?Readmission Risk Interventions ?Readmission Risk Prevention Plan 01/04/2021  ?Post Dischage Appt Complete  ?Medication Screening Complete  ?Transportation Screening Complete  ? ? ? ?

## 2021-06-30 NOTE — Progress Notes (Signed)
PHARMACIST - PHYSICIAN COMMUNICATION ?DR:   Wynelle Cleveland ?CONCERNING: Antibiotic IV to Oral Route Change Policy ? ?RECOMMENDATION: ?This patient is receiving azithomycin by the intravenous route.  Based on criteria approved by the Pharmacy and Therapeutics Committee, the antibiotic(s) is/are being converted to the equivalent oral dose form(s). ? ? ?DESCRIPTION: ?These criteria include: ?Patient being treated for a respiratory tract infection, urinary tract infection, cellulitis or clostridium difficile associated diarrhea if on metronidazole ?The patient is not neutropenic and does not exhibit a GI malabsorption state ?The patient is eating (either orally or via tube) and/or has been taking other orally administered medications for a least 24 hours ?The patient is improving clinically and has a Tmax < 100.5 ? ?If you have questions about this conversion, please contact the Pharmacy Department  ?'[]'$   [phone] )  [facility] ?'[]'$   [phone] )  [facility] ?'[]'$   [phone] )  [facility] ?'[]'$   [phone] )  [facility] ?'[x]'$   [phone] )  [facility]   ? ?Dia Sitter, PharmD, BCPS ?06/30/2021 8:10 AM ? ?

## 2021-07-03 LAB — CULTURE, BLOOD (ROUTINE X 2)
Culture: NO GROWTH
Special Requests: ADEQUATE

## 2021-07-04 LAB — CULTURE, BLOOD (ROUTINE X 2)
Culture: NO GROWTH
Special Requests: ADEQUATE

## 2021-07-27 ENCOUNTER — Other Ambulatory Visit: Payer: Medicare PPO | Admitting: Hospice

## 2021-07-27 DIAGNOSIS — F0390 Unspecified dementia without behavioral disturbance: Secondary | ICD-10-CM

## 2021-07-27 DIAGNOSIS — R269 Unspecified abnormalities of gait and mobility: Secondary | ICD-10-CM

## 2021-07-27 DIAGNOSIS — R531 Weakness: Secondary | ICD-10-CM

## 2021-07-27 DIAGNOSIS — E1069 Type 1 diabetes mellitus with other specified complication: Secondary | ICD-10-CM

## 2021-07-27 DIAGNOSIS — Z515 Encounter for palliative care: Secondary | ICD-10-CM

## 2021-07-27 NOTE — Progress Notes (Signed)
? ? ?[facility] ?Community Palliative Care Consult Note ?Telephone: [phone]  ?Fax: [phone] ? ?PATIENT NAME: Wanda Jones ?[address] ?[address] ?[place] [address] ?[phone] (home)  ?DOB: 12/30/1945 ?MRN: [id number] ? ?PRIMARY CARE PROVIDER:    ?Freeman, Courtney James, NP ?[address] ?[place],  [place] [address] ? ?REFERRING PROVIDER:   ?Freeman, Courtney James, NP ?[address] ?[place],  [place] [address] ?[phone] ? ?RESPONSIBLE PARTY:   Self ?Retired after 34 years from the [employer] in the [employer] for the [employer]. ?Contact Information   ? ? Name Relation Home Work Mobile  ? Herms,Chris Son   [phone]  ? ?  ? ? ? ?I met face to face with patient and family at home. Palliative Care was asked to follow this patient by consultation request of  Freeman, Courtney James* to address advance care planning, complex medical decision making and goals of care clarification. This is the follow up visit. ?RECOMMENDATIONS:  ? ?CODE STATUS:Patient affirmed she is a Full Code ? ?Goals of Care: Goals include to maximize quality of life and symptom management ? ?Symptom Management/Plan: ?Gait disturbance: PT is ongoing for strengthening and gait training. PT reports patient ambulates 500 feet with rolling walker before taking a break.  ?Weakness: PT is ongoing for strengthening.  ?Dementia: Memory loss at baseline. FAST 6C. Managed with Donepezil, Memantin. Encouarge participation in activities, reminiscence, word search/puzzles.  ?Type 1 DM: Blood sugar monitored with Freestyle Libre 2. Readings 80s- 200s, occasional outlier, 164 during visit, per Kelsey - PT. Managed with Novolog and [place] and Ozempic. Med Tech from [facility] check her blood sugar and ensure she takes her insulin.  Last A1c 9.2 02/19/2021.  Repeat A1c.  ?Follow up: Palliative care will continue to follow for complex medical decision making, advance care planning, and clarification of goals. Return 6  weeks or prn.Encouraged to call provider sooner with any concerns.  ? ?Family /Caregiver/Community Supports: Patient lives at home; help from [facility] services, and son lives close by and comes every day to help. Strong family support system identified.  ? ?HOSPICE ELIGIBILITY/DIAGNOSIS: TBD ? ?Chief Complaint: Follow up visit ? ?HISTORY OF PRESENT ILLNESS:  Wanda Jones is a 76 y.o. year old female  with multiple medical conditions including  Dementia with associated memory loss, Type 1 DM, gait disturbance, recurrent UTI, Depression, moderate protein caloric malnutrition. Patient admitted in hospital last month for Pneumonia, completed antibiotics as ordered. Patient denies pain/discomfort, denies shortness of breath, no moodiness, in no acute distress ?History obtained from review of EMR, discussion with primary team, caregiver, family and/or Ms. Kinnear.  ?Review and summarization of Epic records shows history from other than patient. Rest of 10 point ROS asked and negative.  ?I reviewed, as needed, available labs, patient records, imaging, studies and related documents from the EMR. ? ? ?ROS ?General: NAD ?EYES: denies vision changes ?ENMT: denies dysphagia ?Cardiovascular: denies chest pain/discomfort ?Pulmonary: denies cough, denies SOB ?Abdomen: endorses good appetite, denies constipation/diarrhea ?GU: denies dysuria, urinary frequency ?MSK:  endorses weakness,  no falls reported ?Skin: denies rashes or wounds ?Neurological: denies pain, denies insomnia ?Psych: Endorses positive mood ?Heme/lymph/immuno: denies bruises, abnormal bleeding ? ?Physical Exam: ?T 97.6, P88, R16, BP 122/72, 02 97 % RA ?Constitutional: NAD ?General: Well groomed, resting in bed, cooperative ?EYES: anicteric sclera, lids intact, no discharge  ?ENMT: Moist mucous membrane ?CV: S1 S2, RRR, no LE edema ?Pulmonary: LCTA, no increased work of breathing, no cough, ?Abdomen: active BS +   4 quadrants, soft and non tender ?GU: no suprapubic  tenderness ?MSK: weakness, ambulatory with rolling walker ?Skin: warm and dry, no rashes or wounds on visible skin ?Neuro:  weakness, otherwise non focal, memory loss.  ?Psych: non-anxious affect ?Hem/lymph/immuno: no widespread bruising ? ? ?PAST MEDICAL HISTORY:  ?Active Ambulatory Problems  ?  Diagnosis Date Noted  ? AKI (acute kidney injury) ([place]) 01/02/2021  ? Type 2 diabetes mellitus without complication, with long-term current use of insulin ([place]) 01/02/2021  ? Hyperlipidemia 01/24/2021  ? Dementia ([place]) 01/24/2021  ? Acute metabolic encephalopathy 38/01/1750  ? Mild protein malnutrition ([place]) 01/24/2021  ? Weakness 03/06/2021  ? Pressure injury of skin 03/07/2021  ? Sleep apnea 04/11/2021  ? Depression 04/11/2021  ? Incidental finding of COVID-19 virus infection 04/11/2021  ? Sepsis due to pneumonia ([place]) 06/28/2021  ? Low TSH level 06/29/2021  ? ?Resolved Ambulatory Problems  ?  Diagnosis Date Noted  ? Sepsis ([place] [facility]) 01/02/2021  ? UTI (urinary tract infection) 01/02/2021  ? Cellulitis 01/02/2021  ? Hyperglycemia 01/02/2021  ? Volume depletion 01/24/2021  ? Acute lower UTI (urinary tract infection) 04/11/2021  ? Acute metabolic encephalopathy 02/58/5277  ? Low serum cortisol level 06/29/2021  ? ?Past Medical History:  ?Diagnosis Date  ? Diabetes mellitus type 1 ([facility])   ? History of recurrent UTIs   ? ? ?SOCIAL HX:  ?Social History  ? ?Tobacco Use  ? Smoking status: Never  ? Smokeless tobacco: Not on file  ?Substance Use Topics  ? Alcohol use: Not Currently  ? ?  ?FAMILY HX:  ?Family History  ?Problem Relation Age of Onset  ? Hypertension Other   ? Diabetes Neg Hx   ?   ? ?ALLERGIES:  ?Allergies  ?Allergen Reactions  ? Codeine Anaphylaxis  ? Prednisone Anaphylaxis  ?   ? ?PERTINENT MEDICATIONS:  ?Outpatient Encounter Medications as of 07/27/2021  ?Medication Sig  ? acetaminophen (TYLENOL) 325 MG tablet Take 2 tablets (650 mg total) by mouth every 6 (six) hours as needed for mild pain (or Fever >/= 101).  ?  aspirin EC 81 MG tablet Take 81 mg by mouth in the morning. Swallow whole.  ? clobetasol (TEMOVATE) 0.05 % external solution Apply 1 application. topically 2 (two) times daily as needed (for scalp irritation).  ? Continuous Blood Gluc Receiver (FREESTYLE LIBRE 2 READER) DEVI Use as directed. E10.69  ? Continuous Blood Gluc Sensor (FREESTYLE LIBRE 2 SENSOR) MISC Inject 1 patch into the skin every 14 (fourteen) days.  ? donepezil (ARICEPT) 5 MG tablet Take 5 mg by mouth at bedtime.  ? insulin aspart (NOVOLOG FLEXPEN) 100 UNIT/ML FlexPen 3 times a day (just before each meal), 6-5-5 units. (Patient taking differently: 5-6 Units See admin instructions. Inject 6 units into the skin in the morning, 5 units in the afternoon, and 5 units in the evening)  ? insulin degludec (TRESIBA FLEXTOUCH) 100 UNIT/ML FlexTouch Pen Inject 8 Units into the skin daily. (Patient taking differently: Inject 18 Units into the skin in the morning.)  ? levothyroxine (SYNTHROID) 88 MCG tablet Take 88 mcg by mouth daily before breakfast.  ? memantine (NAMENDA) 5 MG tablet Take 5 mg by mouth 2 (two) times daily. (Patient not taking: Reported on 06/28/2021)  ? Multiple Vitamins-Minerals (MULTIVITAMIN ADULTS) TABS Take 1 tablet by mouth at bedtime.  ? MYRBETRIQ 50 MG TB24 tablet Take 50 mg by mouth daily.  ? rosuvastatin (CRESTOR) 5 MG tablet Take 1 tablet (5 mg total) by mouth at bedtime.  ?  Semaglutide,0.25 or 0.5MG/DOS, (OZEMPIC, 0.25 OR 0.5 MG/DOSE,) 2 MG/1.5ML SOPN Inject 0.5 mg into the skin once a week. (Patient not taking: Reported on 06/28/2021)  ? trimethoprim (TRIMPEX) 100 MG tablet Take 100 mg by mouth at bedtime.  ? venlafaxine XR (EFFEXOR-XR) 150 MG 24 hr capsule Take 150 mg by mouth daily with breakfast.  ? vitamin B-12 (CYANOCOBALAMIN) 1000 MCG tablet Take 1,000 mcg by mouth in the morning.  ? ?No facility-administered encounter medications on file as of 07/27/2021.  ?I spent 60 minutes providing this consultation; time includes spent  with patient/family, chart review and documentation. More than 50% of the time in this consultation was spent on care coordination ?Thank you for the opportunity to participate in the care of Ms. Alicea.  The

## 2021-10-28 ENCOUNTER — Other Ambulatory Visit: Payer: Self-pay | Admitting: Nurse Practitioner

## 2021-10-28 DIAGNOSIS — Z1231 Encounter for screening mammogram for malignant neoplasm of breast: Secondary | ICD-10-CM

## 2021-11-12 ENCOUNTER — Other Ambulatory Visit: Payer: Medicare PPO | Admitting: Hospice

## 2021-11-12 DIAGNOSIS — R269 Unspecified abnormalities of gait and mobility: Secondary | ICD-10-CM

## 2021-11-12 DIAGNOSIS — F0390 Unspecified dementia without behavioral disturbance: Secondary | ICD-10-CM

## 2021-11-12 DIAGNOSIS — E1069 Type 1 diabetes mellitus with other specified complication: Secondary | ICD-10-CM

## 2021-11-12 DIAGNOSIS — R531 Weakness: Secondary | ICD-10-CM

## 2021-11-12 DIAGNOSIS — Z515 Encounter for palliative care: Secondary | ICD-10-CM

## 2021-11-12 NOTE — Progress Notes (Signed)
[facility] Pall[facility] Consult Note Telephone: [phone]  Fax: [phone]  PATIENT NAME: Wanda Jones [address] 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 Contact Information     Name Relation Home Work Mobile   Fahr,Chris Son   [phone]        I met face to face with patient and family at home. Palliative Care was asked to follow this patient by consultation request of  Roetta Sessions* to address advance care planning, complex medical decision making and goals of care clarification. This is the follow up visit. RECOMMENDATIONS:   CODE STATUS:Patient affirmed she is a Full Code  Goals of Care: Goals include to maximize quality of life and symptom management  Symptom Management/Plan: Gait disturbance: Fall precautions reiterated.  PT is ongoing for strengthening and gait training. Weakness: Improved. PT is ongoing for strengthening.  Dementia: Memory loss is ongoing in line with Dementia disease trajectory. FAST 6C. Managed with Donepezil, Memantin. Encouarge participation in activities, reminiscence, word search/puzzles.  Type 1 DM:  Last A1c 9.2 02/19/2021.  A1c STAT. Blood sugar monitored with Freestyle Libre 2. Currently managed with Novolog and [place] and Ozempic. Med Tech from [facility] check her blood sugar and ensure she takes her insulin.  Follow up: Palliative care will continue to follow for complex medical decision making, advance care planning, and clarification of goals. Return 6 weeks or prn.Encouraged to call provider sooner with any concerns.    Family /Caregiver/Community Supports: Patient lives at home; help from [facility] services, and son lives close by and comes every day to help. Strong family support system identified.   HOSPICE ELIGIBILITY/DIAGNOSIS: TBD  Chief Complaint: Follow up visit  HISTORY OF PRESENT ILLNESS:  Wanda Jones is a 76 y.o. year old female  with multiple medical conditions including  Dementia with associated memory loss, Type 1 DM, gait disturbance, recurrent UTI, Depression, moderate protein caloric malnutrition. Patient admitted in hospital last month for Pneumonia, completed antibiotics as ordered. Patient denies pain/discomfort, denies shortness of breath, no moodiness, in no acute distress; reports she [place] at her [place] facility with no concerns at this time History obtained from review of EMR, discussion with primary team, caregiver, family and/or [name].  Review and summarization of Epic records shows history from other than patient. Rest of 10 point ROS asked and negative.  I reviewed, as needed, available labs, patient records, imaging, studies and related documents from the EMR.   PAST MEDICAL HISTORY:  Active Ambulatory Problems    Diagnosis Date Noted   AKI (acute kidney injury) ([facility]) 01/02/2021   Type 2 diabetes mellitus without complication, with long-term current use of insulin ([facility]) 01/02/2021   Hyperlipidemia 01/24/2021   Dementia ([facility]) 39/06/90   Acute metabolic encephalopathy 33/00/7622   Mild protein malnutrition ([facility]) 01/24/2021   Weakness 03/06/2021   Pressure injury of skin 03/07/2021   Sleep apnea 04/11/2021   Depression 04/11/2021   Incidental finding of COVID-19 virus infection 04/11/2021   Sepsis due to pneumonia ([facility]) 06/28/2021   Low TSH level 06/29/2021   Resolved Ambulatory Problems  Diagnosis Date Noted   Sepsis ([facility]) 01/02/2021   UTI (urinary tract infection) 01/02/2021   Cellulitis 01/02/2021   Hyperglycemia 01/02/2021   Volume depletion 01/24/2021    Acute lower UTI (urinary tract infection) 16/01/9603   Acute metabolic encephalopathy 54/12/8117   Low serum cortisol level 06/29/2021   Past Medical History:  Diagnosis Date   Diabetes mellitus type 1 ([facility])    History of recurrent UTIs     SOCIAL HX:  Social History   Tobacco Use   Smoking status: Never   Smokeless tobacco: Not on file  Substance Use Topics   Alcohol use: Not Currently     FAMILY HX:  Family History  Problem Relation Age of Onset   Hypertension Other    Diabetes Neg Hx       ALLERGIES:  Allergies  Allergen Reactions   Codeine Anaphylaxis   Prednisone Anaphylaxis      PERTINENT MEDICATIONS:  Outpatient Encounter Medications as of 11/12/2021  Medication Sig   acetaminophen (TYLENOL) 325 MG tablet Take 2 tablets (650 mg total) by mouth every 6 (six) hours as needed for mild pain (or Fever >/= 101).   aspirin EC 81 MG tablet Take 81 mg by mouth in the morning. Swallow whole.   clobetasol (TEMOVATE) 0.05 % external solution Apply 1 application. topically 2 (two) times daily as needed (for scalp irritation).   Continuous Blood Gluc Receiver (FREESTYLE LIBRE 2 READER) DEVI Use as directed. E10.69   Continuous Blood Gluc Sensor (FREESTYLE LIBRE 2 SENSOR) MISC Inject 1 patch into the skin every 14 (fourteen) days.   donepezil (ARICEPT) 5 MG tablet Take 5 mg by mouth at bedtime.   insulin aspart (NOVOLOG FLEXPEN) 100 UNIT/ML FlexPen 3 times a day (just before each meal), 6-5-5 units. (Patient taking differently: 5-6 Units See admin instructions. Inject 6 units into the skin in the morning, 5 units in the afternoon, and 5 units in the evening)   insulin degludec (TRESIBA FLEXTOUCH) 100 UNIT/ML FlexTouch Pen Inject 8 Units into the skin daily. (Patient taking differently: Inject 18 Units into the skin in the morning.)   levothyroxine (SYNTHROID) 88 MCG tablet Take 88 mcg by mouth daily before breakfast.   memantine (NAMENDA) 5 MG tablet Take 5 mg by mouth 2 (two)  times daily. (Patient not taking: Reported on 06/28/2021)   Multiple Vitamins-Minerals (MULTIVITAMIN ADULTS) TABS Take 1 tablet by mouth at bedtime.   MYRBETRIQ 50 MG TB24 tablet Take 50 mg by mouth daily.   rosuvastatin (CRESTOR) 5 MG tablet Take 1 tablet (5 mg total) by mouth at bedtime.   Semaglutide,0.25 or 0.5MG /DOS, (OZEMPIC, 0.25 OR 0.5 MG/DOSE,) 2 MG/1.5ML SOPN Inject 0.5 mg into the skin once a week. (Patient not taking: Reported on 06/28/2021)   trimethoprim (TRIMPEX) 100 MG tablet Take 100 mg by mouth at bedtime.   venlafaxine XR (EFFEXOR-XR) 150 MG 24 hr capsule Take 150 mg by mouth daily with breakfast.   vitamin B-12 (CYANOCOBALAMIN) 1000 MCG tablet Take 1,000 mcg by mouth in the morning.   No facility-administered encounter medications on file as of 11/12/2021.  I spent 60 minutes providing this consultation; time includes spent with patient/family, chart review and documentation. More than 50% of the time in this consultation was spent on care coordination. Thank you for the opportunity to participate in the care of Ms. Hoe.  The palliative care team will continue to follow. Please call our office at [phone] if we can be of additional assistance.   Note:  Portions of this note were generated with Lobbyist. Dictation errors may occur despite best attempts at proofreading.  Teodoro Spray, NP

## 2021-11-22 ENCOUNTER — Ambulatory Visit: Payer: Medicare PPO

## 2021-11-29 ENCOUNTER — Ambulatory Visit
Admission: RE | Admit: 2021-11-29 | Discharge: 2021-11-29 | Disposition: A | Discharge status: Home / Self Care | Payer: Medicare PPO | Source: Ambulatory Visit | Attending: Nurse Practitioner | Admitting: Nurse Practitioner

## 2021-11-29 DIAGNOSIS — Z1231 Encounter for screening mammogram for malignant neoplasm of breast: Secondary | ICD-10-CM

## 2022-04-29 ENCOUNTER — Other Ambulatory Visit: Payer: Medicare PPO | Admitting: Hospice

## 2022-04-29 DIAGNOSIS — Z515 Encounter for palliative care: Secondary | ICD-10-CM

## 2022-04-29 DIAGNOSIS — R269 Unspecified abnormalities of gait and mobility: Secondary | ICD-10-CM

## 2022-04-29 DIAGNOSIS — R531 Weakness: Secondary | ICD-10-CM

## 2022-04-29 DIAGNOSIS — E1069 Type 1 diabetes mellitus with other specified complication: Secondary | ICD-10-CM

## 2022-04-29 DIAGNOSIS — F0390 Unspecified dementia without behavioral disturbance: Secondary | ICD-10-CM

## 2022-04-29 NOTE — Progress Notes (Signed)
[facility] Consult Note Telephone: [phone]  Fax: [phone]  PATIENT NAME: Wanda Jones [address] 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 Contact Information     Name Relation Home Work Mobile   Jones,Wanda Son   [phone]        I met face to face with patient and family at home. Palliative Care was asked to follow this patient by consultation request of  Wanda Jones* to address advance care planning, complex medical decision making and goals of care clarification. This is the follow up visit. RECOMMENDATIONS:   CODE STATUS:Patient affirmed she is a Full Code  Goals of Care: Goals include to maximize quality of life and symptom management  Symptom Management/Plan: Weakness: Improved; more out of bed than before continue activity as tolerated to optimize wellbeing.  Gait disturbance: Fall precautions reiterated.  PT  completed; patient ambulates with her rolling walker.  Dementia: Memory loss/confusion at baseline.  FAST 6C.  FLACC 0.  Dementia managed with Donepezil, Memantin. Encourage participation in activities, reminiscence, word search/puzzles.  Type 1 DM:  Last A1c 9.2 02/19/2021.  A1c STAT. Blood sugar monitored with Freestyle Libre 2. Currently managed with Novolog and [place]. Med Tech from [facility] check her blood sugar and ensure she takes her insulin.  No concentrated sweets.  Repeat A1c.   Follow up: Palliative care will continue to follow for complex medical decision making, advance care planning, and clarification of goals.  Return 6 weeks or prn.Encouraged to call provider sooner with any concerns.   Family /Caregiver/Community Supports: Patient lives at home; help from [facility] services, and son lives close by and comes every day to help. Strong family support system identified.   HOSPICE ELIGIBILITY/DIAGNOSIS: TBD  Chief Complaint: Follow up visit  HISTORY OF PRESENT ILLNESS:  Wanda Jones is a 77 y.o. year old female  with multiple medical conditions including  Dementia with associated memory loss, Type 1 DM, gait disturbance, recurrent UTI, Depression, moderate protein caloric malnutrition. Patient admitted in hospital last month for Pneumonia, completed antibiotics as ordered.  History obtained from review of EMR, discussion with primary team, caregiver, family and/or Wanda Jones.  Review and summarization of Epic records shows history from other than patient. Rest of 10 point ROS asked and negative.  I reviewed, as needed, available labs, patient records, imaging, studies and related documents from the EMR.   PAST MEDICAL HISTORY:  Active Ambulatory Problems    Diagnosis Date Noted   AKI (acute kidney injury) ([facility]) 01/02/2021   Type 2 diabetes mellitus without complication, with long-term current use of insulin ([facility]) 01/02/2021   Hyperlipidemia 01/24/2021   Dementia ([facility]) 93/23/5573   Acute metabolic encephalopathy 22/05/5425   Mild protein malnutrition ([facility]) 01/24/2021   Weakness 03/06/2021   Pressure injury of skin 03/07/2021   Sleep apnea 04/11/2021   Depression 04/11/2021   Incidental finding of COVID-19 virus infection 04/11/2021   Sepsis due to pneumonia ([facility]) 06/28/2021   Low TSH level 06/29/2021   Resolved Ambulatory Problems    Diagnosis Date Noted   Sepsis ([facility]) 01/02/2021  UTI (urinary tract infection) 01/02/2021   Cellulitis 01/02/2021   Hyperglycemia 01/02/2021   Volume depletion 01/24/2021   Acute lower UTI (urinary tract infection) 95/18/8416   Acute metabolic  encephalopathy 60/63/0160   Low serum cortisol level 06/29/2021   Past Medical History:  Diagnosis Date   Breast cancer ([facility]) 1997   Diabetes mellitus type 1 ([facility])    History of recurrent UTIs     SOCIAL HX:  Social History   Tobacco Use   Smoking status: Never   Smokeless tobacco: Not on file  Substance Use Topics   Alcohol use: Not Currently     FAMILY HX:  Family History  Problem Relation Age of Onset   Breast cancer Cousin    Hypertension Other    Diabetes Neg Hx       ALLERGIES:  Allergies  Allergen Reactions   Codeine Anaphylaxis   Prednisone Anaphylaxis      PERTINENT MEDICATIONS:  Outpatient Encounter Medications as of 04/29/2022  Medication Sig   acetaminophen (TYLENOL) 325 MG tablet Take 2 tablets (650 mg total) by mouth every 6 (six) hours as needed for mild pain (or Fever >/= 101).   aspirin EC 81 MG tablet Take 81 mg by mouth in the morning. Swallow whole.   clobetasol (TEMOVATE) 0.05 % external solution Apply 1 application. topically 2 (two) times daily as needed (for scalp irritation).   Continuous Blood Gluc Receiver (FREESTYLE LIBRE 2 READER) DEVI Use as directed. E10.69   Continuous Blood Gluc Sensor (FREESTYLE LIBRE 2 SENSOR) MISC Inject 1 patch into the skin every 14 (fourteen) days.   donepezil (ARICEPT) 5 MG tablet Take 5 mg by mouth at bedtime.   insulin aspart (NOVOLOG FLEXPEN) 100 UNIT/ML FlexPen 3 times a day (just before each meal), 6-5-5 units. (Patient taking differently: 5-6 Units See admin instructions. Inject 6 units into the skin in the morning, 5 units in the afternoon, and 5 units in the evening)   insulin degludec (TRESIBA FLEXTOUCH) 100 UNIT/ML FlexTouch Pen Inject 8 Units into the skin daily. (Patient taking differently: Inject 18 Units into the skin in the morning.)   levothyroxine (SYNTHROID) 88 MCG tablet Take 88 mcg by mouth daily before breakfast.   memantine (NAMENDA) 5 MG tablet Take 5 mg by mouth 2 (two) times daily. (Patient  not taking: Reported on 06/28/2021)   Multiple Vitamins-Minerals (MULTIVITAMIN ADULTS) TABS Take 1 tablet by mouth at bedtime.   MYRBETRIQ 50 MG TB24 tablet Take 50 mg by mouth daily.   rosuvastatin (CRESTOR) 5 MG tablet Take 1 tablet (5 mg total) by mouth at bedtime.   Semaglutide,0.25 or 0.'5MG'$ /DOS, (OZEMPIC, 0.25 OR 0.5 MG/DOSE,) 2 MG/1.5ML SOPN Inject 0.5 mg into the skin once a week. (Patient not taking: Reported on 06/28/2021)   trimethoprim (TRIMPEX) 100 MG tablet Take 100 mg by mouth at bedtime.   venlafaxine XR (EFFEXOR-XR) 150 MG 24 hr capsule Take 150 mg by mouth daily with breakfast.   vitamin B-12 (CYANOCOBALAMIN) 1000 MCG tablet Take 1,000 mcg by mouth in the morning.   No facility-administered encounter medications on file as of 04/29/2022.  I spent 60 minutes providing this consultation; time includes spent with patient/family, chart review and documentation. More than 50% of the time in this consultation was spent on care coordination.  Thank you for the opportunity to participate in the care of Ms. Barnaby.  The palliative care team will continue to follow. Please call our office at [phone] if we can be of additional assistance.  Note: Portions of this note were generated with Lobbyist. Dictation errors may occur despite best attempts at proofreading.  Teodoro Spray, NP

## 2022-05-25 ENCOUNTER — Other Ambulatory Visit: Payer: Medicare PPO | Admitting: Hospice

## 2022-05-25 DIAGNOSIS — E1069 Type 1 diabetes mellitus with other specified complication: Secondary | ICD-10-CM

## 2022-05-25 DIAGNOSIS — Z515 Encounter for palliative care: Secondary | ICD-10-CM

## 2022-05-25 DIAGNOSIS — R531 Weakness: Secondary | ICD-10-CM

## 2022-05-25 DIAGNOSIS — F0390 Unspecified dementia without behavioral disturbance: Secondary | ICD-10-CM

## 2022-05-25 NOTE — Progress Notes (Signed)
[facility] Palliative Care Consult Note Telephone: [phone]  Fax: [phone]  PATIENT NAME: Wanda Jones [address] [place] [address] [phone] (home)  DOB: August 11, 1945 MRN: [id number]  PRIMARY CARE PROVIDER:    Roetta Sessions, NP [address] [place],  [place] [address]  REFERRING PROVIDER:   Roetta Sessions, NP [place] [address] [place],  [place] [address] [phone]  RESPONSIBLE PARTY:   Self Retired after 1 years from the [employer] in the [employer] for the [employer]. Contact Information     Name Relation Home Work Mobile   Casale,Chris Son   [phone]        I met face to face with patient and family at home. Palliative Care was asked to follow this patient by consultation request of  Roetta Sessions* to address advance care planning, complex medical decision making and goals of care clarification. This is the follow up visit. NP called [name] and left him a voicemail with callback number. RECOMMENDATIONS:   CODE STATUS:Patient is a Full Code  Goals of Care: Goals include to maximize quality of life and symptom management  Symptom Management/Plan: Weakness: Improved; more out of bed than before continue activity as tolerated to optimize wellbeing.  Participate in IL exercises.  Fall precautions  discussed. Use of rolling walker reiterated.  Dementia: Without behavior. Memory loss/confusion at baseline.  FAST 6C.  FLACC 0.  Continue Donepezil, Memantine as ordered. Encourage participation in activities, reminiscence, word search/puzzles.  Provide cueing as needed. Type 1 DM:  A1c 9.2 in Epic chart 02/19/2021. Blood sugar monitored with Freestyle Libre 2. Currently managed with Novolog and [place]. Med Tech from [place] check her blood sugar and ensure she takes her insulin.  Patient reports compliance with no concentrated sweets.  Repeat A1c.   Follow up: Palliative care will  continue to follow for complex medical decision making, advance care planning, and clarification of goals. Return 6 weeks or prn.Encouraged to call provider sooner with any concerns.   Family /Caregiver/Community Supports: Patient lives at home; help from [place] services, and son lives close by and comes every day to help. Strong family support system identified.   HOSPICE ELIGIBILITY/DIAGNOSIS: TBD  Chief Complaint: Follow up visit  HISTORY OF PRESENT ILLNESS:  Wanda Jones is a 77 y.o. year old female  with multiple medical conditions including  Dementia with associated memory loss, Type 1 DM, gait disturbance, recurrent UTI, Depression, moderate protein caloric malnutrition. Patient in no acute distress, denies pain/discomfort. History obtained from review of EMR, discussion with primary team, caregiver, family and/or Ms. Guse.  Review and summarization of Epic records shows history from other than patient. Rest of 10 point ROS asked and negative.  I reviewed, as needed, available labs, patient records, imaging, studies and related documents from the EMR.   PAST MEDICAL HISTORY:  Active Ambulatory Problems    Diagnosis Date Noted   AKI (acute kidney injury) ([facility]) 01/02/2021   Type 2 diabetes mellitus without complication, with long-term current use of insulin ([facility]) 01/02/2021   Hyperlipidemia 01/24/2021   Dementia ([facility]) 81/15/7262   Acute metabolic encephalopathy 03/55/9741   Mild protein malnutrition ([facility]) 01/24/2021   Weakness 03/06/2021   Pressure injury of skin 03/07/2021   Sleep apnea 04/11/2021   Depression 04/11/2021   Incidental finding of COVID-19 virus infection 04/11/2021   Sepsis due to pneumonia ([facility]) 06/28/2021   Low TSH level 06/29/2021   Resolved Ambulatory Problems  Diagnosis Date Noted   Sepsis ([facility]) 01/02/2021   UTI (urinary tract infection) 01/02/2021   Cellulitis 01/02/2021   Hyperglycemia 01/02/2021   Volume depletion 01/24/2021   Acute lower  UTI (urinary tract infection) 40/98/1191   Acute metabolic encephalopathy 47/82/9562   Low serum cortisol level 06/29/2021   Past Medical History:  Diagnosis Date   Breast cancer ([facility]) 1997   Diabetes mellitus type 1 ([facility])    History of recurrent UTIs     SOCIAL HX:  Social History   Tobacco Use   Smoking status: Never   Smokeless tobacco: Not on file  Substance Use Topics   Alcohol use: Not Currently     FAMILY HX:  Family History  Problem Relation Age of Onset   Breast cancer Cousin    Hypertension Other    Diabetes Neg Hx       ALLERGIES:  Allergies  Allergen Reactions   Codeine Anaphylaxis   Prednisone Anaphylaxis      PERTINENT MEDICATIONS:  Outpatient Encounter Medications as of 05/25/2022  Medication Sig   acetaminophen (TYLENOL) 325 MG tablet Take 2 tablets (650 mg total) by mouth every 6 (six) hours as needed for mild pain (or Fever >/= 101).   aspirin EC 81 MG tablet Take 81 mg by mouth in the morning. Swallow whole.   clobetasol (TEMOVATE) 0.05 % external solution Apply 1 application. topically 2 (two) times daily as needed (for scalp irritation).   Continuous Blood Gluc Receiver (FREESTYLE LIBRE 2 READER) DEVI Use as directed. E10.69   Continuous Blood Gluc Sensor (FREESTYLE LIBRE 2 SENSOR) MISC Inject 1 patch into the skin every 14 (fourteen) days.   donepezil (ARICEPT) 5 MG tablet Take 5 mg by mouth at bedtime.   insulin aspart (NOVOLOG FLEXPEN) 100 UNIT/ML FlexPen 3 times a day (just before each meal), 6-5-5 units. (Patient taking differently: 5-6 Units See admin instructions. Inject 6 units into the skin in the morning, 5 units in the afternoon, and 5 units in the evening)   insulin degludec (TRESIBA FLEXTOUCH) 100 UNIT/ML FlexTouch Pen Inject 8 Units into the skin daily. (Patient taking differently: Inject 18 Units into the skin in the morning.)   levothyroxine (SYNTHROID) 88 MCG tablet Take 88 mcg by mouth daily before breakfast.   memantine (NAMENDA) 5  MG tablet Take 5 mg by mouth 2 (two) times daily. (Patient not taking: Reported on 06/28/2021)   Multiple Vitamins-Minerals (MULTIVITAMIN ADULTS) TABS Take 1 tablet by mouth at bedtime.   MYRBETRIQ 50 MG TB24 tablet Take 50 mg by mouth daily.   rosuvastatin (CRESTOR) 5 MG tablet Take 1 tablet (5 mg total) by mouth at bedtime.   Semaglutide,0.25 or 0.'5MG'$ /DOS, (OZEMPIC, 0.25 OR 0.5 MG/DOSE,) 2 MG/1.5ML SOPN Inject 0.5 mg into the skin once a week. (Patient not taking: Reported on 06/28/2021)   trimethoprim (TRIMPEX) 100 MG tablet Take 100 mg by mouth at bedtime.   venlafaxine XR (EFFEXOR-XR) 150 MG 24 hr capsule Take 150 mg by mouth daily with breakfast.   vitamin B-12 (CYANOCOBALAMIN) 1000 MCG tablet Take 1,000 mcg by mouth in the morning.   No facility-administered encounter medications on file as of 05/25/2022.  I spent 45 minutes providing this consultation; time includes spent with patient/family, chart review and documentation. More than 50% of the time in this consultation was spent on care coordination.  Thank you for the opportunity to participate in the care of Ms. Weilbacher.  The palliative care team will continue to follow. Please call our  office at [phone] if we can be of additional assistance.   Note: Portions of this note were generated with Lobbyist. Dictation errors may occur despite best attempts at proofreading.  Teodoro Spray, NP    A1c

## 2022-07-14 ENCOUNTER — Other Ambulatory Visit: Payer: Medicare PPO | Admitting: Hospice

## 2022-07-14 DIAGNOSIS — E1069 Type 1 diabetes mellitus with other specified complication: Secondary | ICD-10-CM

## 2022-07-14 DIAGNOSIS — F0390 Unspecified dementia without behavioral disturbance: Secondary | ICD-10-CM

## 2022-07-14 DIAGNOSIS — R52 Pain, unspecified: Secondary | ICD-10-CM

## 2022-07-14 DIAGNOSIS — R531 Weakness: Secondary | ICD-10-CM

## 2022-07-14 DIAGNOSIS — Z515 Encounter for palliative care: Secondary | ICD-10-CM

## 2022-07-14 NOTE — Progress Notes (Signed)
[facility] Palliative Care Consult Note Telephone: [phone]  Fax: [phone]  PATIENT NAME: Wanda Jones [address] 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 Contact Information     Name Relation Home Work Mobile   Wanda Jones Son   [phone]        I met face to face with patient and family at home. Palliative Care was asked to follow this patient by consultation request of  Wanda Jones* to address advance care planning, complex medical decision making and goals of care clarification. This is the follow up visit. NP called Wanda Jones and left him a voicemail with callback number.   RECOMMENDATIONS:   CODE STATUS:Patient is a Full Code  Goals of Care: Goals include to maximize quality of life and symptom management  Symptom Management/Plan: Weakness: Encourage participation in IL restorative exercises.  Use of rolling walker to provide support and help prevent fall reiterated.  Balance of rest and performance activity, energy conservation.  Pain: Generalized.  Mild.  Continue Tylenol 500 mg twice daily as needed for mild pain.  Dementia: Continue Donepezil, Memantine as ordered. Encourage participation in activities, reminiscence, word search/puzzles.  Provide cueing as needed.  Safety precautions Memory loss/confusion at baseline. FAST 6C.  FLACC 0.  Continue ongoing supportive care.  Type 1 DM: Repeat A1c.  Current A1c 9.2 in Epic chart 02/19/2021. Blood sugar monitored with Freestyle Libre 2. Currently managed with Novolog and [place]. Med Tech from [place]  check her blood sugar and ensure she takes her insulin.  Patient reports compliance with no concentrated sweets.  Collaborative discussion with resident nursing supervisor Wanda Jones; she reports family manages patient's medications.  OT Wanda Jones working with patient's vision and ability to use her iPad for reading and entertainment.  Follow up: Palliative care will continue to follow for complex medical decision making, advance care planning, and clarification of goals. Return 6 weeks or prn.Encouraged to call provider sooner with any concerns.   Family /Caregiver/Community Supports: Patient lives at home; help from [facility] services, and son lives close by and comes every day to help. Strong family support system identified.   HOSPICE ELIGIBILITY/DIAGNOSIS: TBD  Chief Complaint: Follow up visit  HISTORY OF PRESENT ILLNESS:  Wanda Jones is a 77 y.o. year old female  with multiple medical conditions including  Dementia with associated Memory loss, Type 1 DM, gait disturbance, recurrent UTI, Depression, moderate protein caloric malnutrition. Patient in no acute distress, denies pain/discomfort during visit; reports occasional mild pain, generalized for which Tylenol is effective History obtained from review of EMR, discussion with primary team, caregiver, family and/or Wanda Jones.  Review and summarization of Epic records shows history from other than patient. Rest of 10 point ROS asked and negative.  I reviewed, as needed, available labs, patient records, imaging, studies and related documents from the EMR.   PAST MEDICAL HISTORY:  Active Ambulatory Problems    Diagnosis Date Noted   AKI (acute kidney injury) ([facility]) 01/02/2021   Type 2 diabetes mellitus without complication, with long-term current use of insulin ([facility]) 01/02/2021   Hyperlipidemia 01/24/2021   Dementia (  [facility]) 123456   Acute metabolic encephalopathy 123456   Mild protein malnutrition ([facility]) 01/24/2021   Weakness 03/06/2021    Pressure injury of skin 03/07/2021   Sleep apnea 04/11/2021   Depression 04/11/2021   Incidental finding of COVID-19 virus infection 04/11/2021   Sepsis due to pneumonia ([facility]) 06/28/2021   Low TSH level 06/29/2021   Resolved Ambulatory Problems    Diagnosis Date Noted   Sepsis ([facility]) 01/02/2021   UTI (urinary tract infection) 01/02/2021   Cellulitis 01/02/2021   Hyperglycemia 01/02/2021   Volume depletion 01/24/2021   Acute lower UTI (urinary tract infection) A999333   Acute metabolic encephalopathy A999333   Low serum cortisol level 06/29/2021   Past Medical History:  Diagnosis Date   Breast cancer ([facility]) 1997   Diabetes mellitus type 1 ([facility])    History of recurrent UTIs     SOCIAL HX:  Social History   Tobacco Use   Smoking status: Never   Smokeless tobacco: Not on file  Substance Use Topics   Alcohol use: Not Currently     FAMILY HX:  Family History  Problem Relation Age of Onset   Breast cancer Cousin    Hypertension Other    Diabetes Neg Hx       ALLERGIES:  Allergies  Allergen Reactions   Codeine Anaphylaxis   Prednisone Anaphylaxis      PERTINENT MEDICATIONS:  Outpatient Encounter Medications as of 07/14/2022  Medication Sig   acetaminophen (TYLENOL) 325 MG tablet Take 2 tablets (650 mg total) by mouth every 6 (six) hours as needed for mild pain (or Fever >/= 101).   aspirin EC 81 MG tablet Take 81 mg by mouth in the morning. Swallow whole.   clobetasol (TEMOVATE) 0.05 % external solution Apply 1 application. topically 2 (two) times daily as needed (for scalp irritation).   Continuous Blood Gluc Receiver (FREESTYLE LIBRE 2 READER) DEVI Use as directed. E10.69   Continuous Blood Gluc Sensor (FREESTYLE LIBRE 2 SENSOR) MISC Inject 1 patch into the skin every 14 (fourteen) days.   donepezil (ARICEPT) 5 MG tablet Take 5 mg by mouth at bedtime.   insulin aspart (NOVOLOG FLEXPEN) 100 UNIT/ML FlexPen 3 times a day (just before each meal), 6-5-5 units.  (Patient taking differently: 5-6 Units See admin instructions. Inject 6 units into the skin in the morning, 5 units in the afternoon, and 5 units in the evening)   insulin degludec (TRESIBA FLEXTOUCH) 100 UNIT/ML FlexTouch Pen Inject 8 Units into the skin daily. (Patient taking differently: Inject 18 Units into the skin in the morning.)   levothyroxine (SYNTHROID) 88 MCG tablet Take 88 mcg by mouth daily before breakfast.   memantine (NAMENDA) 5 MG tablet Take 5 mg by mouth 2 (two) times daily. (Patient not taking: Reported on 06/28/2021)   Multiple Vitamins-Minerals (MULTIVITAMIN ADULTS) TABS Take 1 tablet by mouth at bedtime.   MYRBETRIQ 50 MG TB24 tablet Take 50 mg by mouth daily.   rosuvastatin (CRESTOR) 5 MG tablet Take 1 tablet (5 mg total) by mouth at bedtime.   Semaglutide,0.25 or 0.5MG /DOS, (OZEMPIC, 0.25 OR 0.5 MG/DOSE,) 2 MG/1.5ML SOPN Inject 0.5 mg into the skin once a week. (Patient not taking: Reported on 06/28/2021)   trimethoprim (TRIMPEX) 100 MG tablet Take 100 mg by mouth at bedtime.   venlafaxine XR (EFFEXOR-XR) 150 MG 24 hr capsule Take 150 mg by mouth daily with breakfast.   vitamin B-12 (CYANOCOBALAMIN) 1000 MCG tablet Take 1,000 mcg by mouth in the morning.  No facility-administered encounter medications on file as of 07/14/2022.  I spent 45 minutes providing this consultation; time includes spent with patient/family, chart review and documentation. More than 50% of the time in this consultation was spent on care coordination.  Thank you for the opportunity to participate in the care of Ms. Petko.  The palliative care team will continue to follow. Please call our office at [phone] if we can be of additional assistance.   Note: Portions of this note were generated with Lobbyist. Dictation errors may occur despite best attempts at proofreading.  Teodoro Spray, NP

## 2022-09-05 ENCOUNTER — Other Ambulatory Visit: Payer: Medicare PPO | Admitting: Hospice

## 2022-09-05 DIAGNOSIS — R531 Weakness: Secondary | ICD-10-CM

## 2022-09-05 DIAGNOSIS — F0390 Unspecified dementia without behavioral disturbance: Secondary | ICD-10-CM

## 2022-09-05 DIAGNOSIS — R269 Unspecified abnormalities of gait and mobility: Secondary | ICD-10-CM

## 2022-09-05 DIAGNOSIS — R52 Pain, unspecified: Secondary | ICD-10-CM

## 2022-09-05 DIAGNOSIS — Z515 Encounter for palliative care: Secondary | ICD-10-CM

## 2022-09-05 NOTE — Progress Notes (Signed)
Therapist, [facility] Palliative Care Consult Note Telephone: [phone]  Fax: [phone]  PATIENT NAME: Wanda Jones [address] 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 Contact Information     Name Relation Home Work Mobile   [place],Chris Son   [phone]        I met face to face with patient and family at home. Palliative Care was asked to follow this patient by consultation request of  Joycelyn Man* to address advance care planning, complex medical decision making and goals of care clarification. This is the follow up visit.  Visit consisted of counseling and education dealing with the complex and emotionally intense issues of symptom management and palliative care in the setting of serious and potentially life-threatening illness. Palliative care team will continue to support patient, patient's family, and medical team.   RECOMMENDATIONS:   CODE STATUS:Patient is a Full Code  Goals of Care: Goals include to maximize quality of life and symptom management  Symptom Management/Plan: Weakness/Gait disturbance: Fall risk, use of rolling walker to provide support and help prevent fall reiterated. Completed PT/OT. Encourage participation in IL restorative exercises.  Balance of rest and performance activity, energy conservation.  Pain: Continue Tylenol 500 mg twice daily as needed for mild generalized pain.  Dementia: Continue Donepezil, Memantine as ordered. Encourage participation in activities, reminiscence, word search/puzzles.  Provide cueing as needed.  Safety  precautions Memory loss/confusion at baseline. FAST 6C.  FLACC 0.  Continue ongoing supportive care.   Follow up: Palliative care will continue to follow for complex medical decision making, advance care planning, and clarification of goals. Return 6 weeks or prn.Encouraged to call provider sooner with any concerns.   Family /Caregiver/Community Supports: Patient lives at home; help from [facility], and son lives close by and comes every day to help. Strong family support system identified.   HOSPICE ELIGIBILITY/DIAGNOSIS: TBD  Chief Complaint: Follow up visit  HISTORY OF PRESENT ILLNESS:  Wanda Jones is a 77 y.o. year old female  with multiple medical conditions including  Dementia with associated Memory loss, Type 1 DM, gait disturbance, recurrent UTI, Depression, moderate protein caloric malnutrition. Patient in no acute distress, denies pain/discomfort during visit; reports occasional mild pain, generalized for which Tylenol is effective, no hospitalization since last visit.  History obtained from review of EMR, discussion with primary team, caregiver, family and/or Ms. Cruise.  Review and summarization of Epic records shows history from other than patient. Rest of 10 point ROS asked and negative.  I reviewed, as needed, available labs, patient records, imaging, studies and related documents from the EMR.  PHYSICAL EXAM: GEN: in no acute distress  Cardiac: S1 S2, no edema in BLE Respiratory: clear to auscultation, no wheezing, no cough GI: soft, nontender, nondistended, + BS   MS: ambulatory with rolling walker, unsteady gait  Skin: warm and dry, no rash to visible skin Neuro: Generalized weakness, nonfocal, alert and oriented x 2, memory loss Psych: non-anxious affect   PAST MEDICAL HISTORY:  Active Ambulatory Problems    Diagnosis Date Noted   AKI (acute kidney injury) ([facility]) 01/02/2021  Type 2 diabetes mellitus without complication, with long-term current use of  insulin ([facility]) 01/02/2021   Hyperlipidemia 01/24/2021   Dementia ([facility]) 01/24/2021   Acute metabolic encephalopathy 01/24/2021   Mild protein malnutrition ([facility]) 01/24/2021   Weakness 03/06/2021   Pressure injury of skin 03/07/2021   Sleep apnea 04/11/2021   Depression 04/11/2021   Incidental finding of COVID-19 virus infection 04/11/2021   Sepsis due to pneumonia ([facility]) 06/28/2021   Low TSH level 06/29/2021   Resolved Ambulatory Problems    Diagnosis Date Noted   Sepsis ([facility]) 01/02/2021   UTI (urinary tract infection) 01/02/2021   Cellulitis 01/02/2021   Hyperglycemia 01/02/2021   Volume depletion 01/24/2021   Acute lower UTI (urinary tract infection) 04/11/2021   Acute metabolic encephalopathy 04/11/2021   Low serum cortisol level 06/29/2021   Past Medical History:  Diagnosis Date   Breast cancer ([facility]) 1997   Diabetes mellitus type 1 ([facility])    History of recurrent UTIs     SOCIAL HX:  Social History   Tobacco Use   Smoking status: Never   Smokeless tobacco: Not on file  Substance Use Topics   Alcohol use: Not Currently     FAMILY HX:  Family History  Problem Relation Age of Onset   Breast cancer Cousin    Hypertension Other    Diabetes Neg Hx       ALLERGIES:  Allergies  Allergen Reactions   Codeine Anaphylaxis   Prednisone Anaphylaxis      PERTINENT MEDICATIONS:  Outpatient Encounter Medications as of 07/14/2022  Medication Sig   acetaminophen (TYLENOL) 325 MG tablet Take 2 tablets (650 mg total) by mouth every 6 (six) hours as needed for mild pain (or Fever >/= 101).   aspirin EC 81 MG tablet Take 81 mg by mouth in the morning. Swallow whole.   clobetasol (TEMOVATE) 0.05 % external solution Apply 1 application. topically 2 (two) times daily as needed (for scalp irritation).   Continuous Blood Gluc Receiver (FREESTYLE LIBRE 2 READER) DEVI Use as directed. E10.69   Continuous Blood Gluc Sensor (FREESTYLE LIBRE 2 SENSOR) MISC Inject 1 patch into the skin  every 14 (fourteen) days.   donepezil (ARICEPT) 5 MG tablet Take 5 mg by mouth at bedtime.   insulin aspart (NOVOLOG FLEXPEN) 100 UNIT/ML FlexPen 3 times a day (just before each meal), 6-5-5 units. (Patient taking differently: 5-6 Units See admin instructions. Inject 6 units into the skin in the morning, 5 units in the afternoon, and 5 units in the evening)   insulin degludec (TRESIBA FLEXTOUCH) 100 UNIT/ML FlexTouch Pen Inject 8 Units into the skin daily. (Patient taking differently: Inject 18 Units into the skin in the morning.)   levothyroxine (SYNTHROID) 88 MCG tablet Take 88 mcg by mouth daily before breakfast.   memantine (NAMENDA) 5 MG tablet Take 5 mg by mouth 2 (two) times daily. (Patient not taking: Reported on 06/28/2021)   Multiple Vitamins-Minerals (MULTIVITAMIN ADULTS) TABS Take 1 tablet by mouth at bedtime.   MYRBETRIQ 50 MG TB24 tablet Take 50 mg by mouth daily.   rosuvastatin (CRESTOR) 5 MG tablet Take 1 tablet (5 mg total) by mouth at bedtime.   Semaglutide,0.25 or 0.5MG /DOS, (OZEMPIC, 0.25 OR 0.5 MG/DOSE,) 2 MG/1.5ML SOPN Inject 0.5 mg into the skin once a week. (Patient not taking: Reported on 06/28/2021)   trimethoprim (TRIMPEX) 100 MG tablet Take 100 mg by mouth at bedtime.   venlafaxine XR (EFFEXOR-XR) 150 MG 24 hr capsule Take 150 mg by mouth  daily with breakfast.   vitamin B-12 (CYANOCOBALAMIN) 1000 MCG tablet Take 1,000 mcg by mouth in the morning.   No facility-administered encounter medications on file as of 07/14/2022.  I spent 45 minutes providing this consultation; time includes spent with patient/family, chart review and documentation. More than 50% of the time in this consultation was spent on care coordination.  Thank you for the opportunity to participate in the care of Ms. Downs.  The palliative care team will continue to follow. Please call our office at [phone] if we can be of additional assistance.   Note: Portions of this note were generated with Administrator, sports. Dictation errors may occur despite best attempts at proofreading.  Rosaura Carpenter, NP

## 2024-01-14 ENCOUNTER — Inpatient Hospital Stay (HOSPITAL_COMMUNITY)
Admission: EM | Admit: 2024-01-14 | Discharge: 2024-01-16 | DRG: 193 | Disposition: A | Discharge status: Home / Self Care

## 2024-01-14 ENCOUNTER — Encounter (HOSPITAL_COMMUNITY): Payer: Self-pay

## 2024-01-14 ENCOUNTER — Other Ambulatory Visit: Payer: Self-pay

## 2024-01-14 ENCOUNTER — Emergency Department (HOSPITAL_COMMUNITY)

## 2024-01-14 DIAGNOSIS — Z794 Long term (current) use of insulin: Secondary | ICD-10-CM

## 2024-01-14 DIAGNOSIS — Z7982 Long term (current) use of aspirin: Secondary | ICD-10-CM

## 2024-01-14 DIAGNOSIS — Z1152 Encounter for screening for COVID-19: Secondary | ICD-10-CM

## 2024-01-14 DIAGNOSIS — R030 Elevated blood-pressure reading, without diagnosis of hypertension: Secondary | ICD-10-CM | POA: Diagnosis present

## 2024-01-14 DIAGNOSIS — Z87440 Personal history of urinary (tract) infections: Secondary | ICD-10-CM

## 2024-01-14 DIAGNOSIS — D649 Anemia, unspecified: Secondary | ICD-10-CM | POA: Diagnosis present

## 2024-01-14 DIAGNOSIS — G928 Other toxic encephalopathy: Secondary | ICD-10-CM | POA: Diagnosis present

## 2024-01-14 DIAGNOSIS — B961 Klebsiella pneumoniae [K. pneumoniae] as the cause of diseases classified elsewhere: Secondary | ICD-10-CM | POA: Diagnosis present

## 2024-01-14 DIAGNOSIS — Z885 Allergy status to narcotic agent status: Secondary | ICD-10-CM

## 2024-01-14 DIAGNOSIS — J189 Pneumonia, unspecified organism: Secondary | ICD-10-CM | POA: Diagnosis not present

## 2024-01-14 DIAGNOSIS — N3000 Acute cystitis without hematuria: Secondary | ICD-10-CM | POA: Diagnosis present

## 2024-01-14 DIAGNOSIS — F0390 Unspecified dementia without behavioral disturbance: Secondary | ICD-10-CM | POA: Diagnosis present

## 2024-01-14 DIAGNOSIS — G4730 Sleep apnea, unspecified: Secondary | ICD-10-CM | POA: Diagnosis present

## 2024-01-14 DIAGNOSIS — E119 Type 2 diabetes mellitus without complications: Secondary | ICD-10-CM

## 2024-01-14 DIAGNOSIS — Z853 Personal history of malignant neoplasm of breast: Secondary | ICD-10-CM

## 2024-01-14 DIAGNOSIS — N390 Urinary tract infection, site not specified: Secondary | ICD-10-CM | POA: Diagnosis present

## 2024-01-14 DIAGNOSIS — E785 Hyperlipidemia, unspecified: Secondary | ICD-10-CM | POA: Diagnosis present

## 2024-01-14 DIAGNOSIS — Z79899 Other long term (current) drug therapy: Secondary | ICD-10-CM

## 2024-01-14 DIAGNOSIS — Z888 Allergy status to other drugs, medicaments and biological substances status: Secondary | ICD-10-CM

## 2024-01-14 DIAGNOSIS — I959 Hypotension, unspecified: Secondary | ICD-10-CM | POA: Diagnosis present

## 2024-01-14 DIAGNOSIS — F32A Depression, unspecified: Secondary | ICD-10-CM | POA: Diagnosis present

## 2024-01-14 DIAGNOSIS — Z7985 Long-term (current) use of injectable non-insulin antidiabetic drugs: Secondary | ICD-10-CM

## 2024-01-14 DIAGNOSIS — E039 Hypothyroidism, unspecified: Secondary | ICD-10-CM | POA: Diagnosis present

## 2024-01-14 DIAGNOSIS — Z66 Do not resuscitate: Secondary | ICD-10-CM | POA: Diagnosis present

## 2024-01-14 DIAGNOSIS — T383X1A Poisoning by insulin and oral hypoglycemic [antidiabetic] drugs, accidental (unintentional), initial encounter: Secondary | ICD-10-CM | POA: Diagnosis present

## 2024-01-14 DIAGNOSIS — Z87892 Personal history of anaphylaxis: Secondary | ICD-10-CM

## 2024-01-14 DIAGNOSIS — E1165 Type 2 diabetes mellitus with hyperglycemia: Secondary | ICD-10-CM | POA: Diagnosis present

## 2024-01-14 DIAGNOSIS — R0989 Other specified symptoms and signs involving the circulatory and respiratory systems: Secondary | ICD-10-CM | POA: Insufficient documentation

## 2024-01-14 DIAGNOSIS — R739 Hyperglycemia, unspecified: Secondary | ICD-10-CM | POA: Diagnosis present

## 2024-01-14 DIAGNOSIS — N179 Acute kidney failure, unspecified: Secondary | ICD-10-CM | POA: Diagnosis present

## 2024-01-14 DIAGNOSIS — Z789 Other specified health status: Secondary | ICD-10-CM

## 2024-01-14 DIAGNOSIS — Z79890 Hormone replacement therapy (postmenopausal): Secondary | ICD-10-CM

## 2024-01-14 LAB — CBC WITH DIFFERENTIAL/PLATELET
Abs Immature Granulocytes: 0.11 K/uL — ABNORMAL HIGH (ref 0.00–0.07)
Basophils Absolute: 0.1 K/uL (ref 0.0–0.1)
Basophils Relative: 1 %
Eosinophils Absolute: 0 K/uL (ref 0.0–0.5)
Eosinophils Relative: 0 %
HCT: 37.8 % (ref 36.0–46.0)
Hemoglobin: 12 g/dL (ref 12.0–15.0)
Immature Granulocytes: 1 %
Lymphocytes Relative: 5 %
Lymphs Abs: 0.8 K/uL (ref 0.7–4.0)
MCH: 29.2 pg (ref 26.0–34.0)
MCHC: 31.7 g/dL (ref 30.0–36.0)
MCV: 92 fL (ref 80.0–100.0)
Monocytes Absolute: 1 K/uL (ref 0.1–1.0)
Monocytes Relative: 6 %
Neutro Abs: 14.9 K/uL — ABNORMAL HIGH (ref 1.7–7.7)
Neutrophils Relative %: 87 %
Platelets: 348 K/uL (ref 150–400)
RBC: 4.11 MIL/uL (ref 3.87–5.11)
RDW: 11.9 % (ref 11.5–15.5)
WBC: 16.9 K/uL — ABNORMAL HIGH (ref 4.0–10.5)
nRBC: 0 % (ref 0.0–0.2)

## 2024-01-14 LAB — I-STAT VENOUS BLOOD GAS, ED
Acid-Base Excess: 1 mmol/L (ref 0.0–2.0)
Bicarbonate: 25.2 mmol/L (ref 20.0–28.0)
Calcium, Ion: 1.07 mmol/L — ABNORMAL LOW (ref 1.15–1.40)
HCT: 35 % — ABNORMAL LOW (ref 36.0–46.0)
Hemoglobin: 11.9 g/dL — ABNORMAL LOW (ref 12.0–15.0)
O2 Saturation: 36 %
Potassium: 3.9 mmol/L (ref 3.5–5.1)
Sodium: 139 mmol/L (ref 135–145)
TCO2: 26 mmol/L (ref 22–32)
pCO2, Ven: 36.8 mmHg — ABNORMAL LOW (ref 44–60)
pH, Ven: 7.444 — ABNORMAL HIGH (ref 7.25–7.43)
pO2, Ven: 20 mmHg — CL (ref 32–45)

## 2024-01-14 LAB — BASIC METABOLIC PANEL WITH GFR
Anion gap: 14 (ref 5–15)
BUN: 19 mg/dL (ref 8–23)
CO2: 22 mmol/L (ref 22–32)
Calcium: 8.7 mg/dL — ABNORMAL LOW (ref 8.9–10.3)
Chloride: 101 mmol/L (ref 98–111)
Creatinine, Ser: 1.19 mg/dL — ABNORMAL HIGH (ref 0.44–1.00)
GFR, Estimated: 47 mL/min — ABNORMAL LOW (ref >60)
Glucose, Bld: 238 mg/dL — ABNORMAL HIGH (ref 70–99)
Potassium: 4.3 mmol/L (ref 3.5–5.1)
Sodium: 137 mmol/L (ref 135–145)

## 2024-01-14 LAB — URINALYSIS, ROUTINE W REFLEX MICROSCOPIC
Bilirubin Urine: NEGATIVE
Glucose, UA: >=500 mg/dL — AB
Hgb urine dipstick: NEGATIVE
Ketones, ur: NEGATIVE mg/dL
Nitrite: POSITIVE — AB
Protein, ur: 30 mg/dL — AB
Specific Gravity, Urine: 1.012 (ref 1.005–1.030)
WBC, UA: >50 WBC/hpf (ref 0–5)
pH: 6 (ref 5.0–8.0)

## 2024-01-14 LAB — BETA-HYDROXYBUTYRIC ACID: Beta-Hydroxybutyric Acid: 0.12 mmol/L (ref 0.05–0.27)

## 2024-01-14 LAB — RESP PANEL BY RT-PCR (RSV, FLU A&B, COVID)  RVPGX2
Influenza A by PCR: NEGATIVE
Influenza B by PCR: NEGATIVE
Resp Syncytial Virus by PCR: NEGATIVE
SARS Coronavirus 2 by RT PCR: NEGATIVE

## 2024-01-14 LAB — CBG MONITORING, ED
Glucose-Capillary: 294 mg/dL — ABNORMAL HIGH (ref 70–99)
Glucose-Capillary: 399 mg/dL — ABNORMAL HIGH (ref 70–99)

## 2024-01-14 MED ORDER — SODIUM CHLORIDE 0.9 % IV SOLN
500.0000 mg | INTRAVENOUS | Status: DC
  Administered 2024-01-15: 500 mg via INTRAVENOUS
  Filled 2024-01-14 (×2): qty 5

## 2024-01-14 MED ORDER — DONEPEZIL HCL 10 MG PO TABS
5.0000 mg | ORAL_TABLET | Freq: Every day | ORAL | Status: DC
  Administered 2024-01-14 – 2024-01-15 (×2): 5 mg via ORAL
  Filled 2024-01-14 (×2): qty 1

## 2024-01-14 MED ORDER — ACETAMINOPHEN 325 MG PO TABS: 650.0000 mg | ORAL_TABLET | Freq: Four times a day (QID) | ORAL | Status: DC | PRN

## 2024-01-14 MED ORDER — LACTATED RINGERS IV BOLUS
500.0000 mL | Freq: Once | INTRAVENOUS | Status: AC
  Administered 2024-01-14: 500 mL via INTRAVENOUS

## 2024-01-14 MED ORDER — SODIUM CHLORIDE 0.9 % IV SOLN
500.0000 mg | Freq: Once | INTRAVENOUS | Status: AC
  Administered 2024-01-14: 500 mg via INTRAVENOUS
  Filled 2024-01-14: qty 5

## 2024-01-14 MED ORDER — SODIUM CHLORIDE 0.9 % IV SOLN
2.0000 g | INTRAVENOUS | Status: DC
  Administered 2024-01-15: 2 g via INTRAVENOUS
  Filled 2024-01-14: qty 20

## 2024-01-14 MED ORDER — LEVOTHYROXINE SODIUM 75 MCG PO TABS
75.0000 ug | ORAL_TABLET | Freq: Every day | ORAL | Status: DC
  Administered 2024-01-15 – 2024-01-16 (×2): 75 ug via ORAL
  Filled 2024-01-14 (×2): qty 1

## 2024-01-14 MED ORDER — ROSUVASTATIN CALCIUM 5 MG PO TABS
5.0000 mg | ORAL_TABLET | Freq: Every day | ORAL | Status: DC
  Administered 2024-01-14 – 2024-01-15 (×2): 5 mg via ORAL
  Filled 2024-01-14 (×2): qty 1

## 2024-01-14 MED ORDER — SODIUM CHLORIDE 0.9 % IV SOLN
2.0000 g | Freq: Once | INTRAVENOUS | Status: AC
  Administered 2024-01-14: 2 g via INTRAVENOUS
  Filled 2024-01-14: qty 20

## 2024-01-14 MED ORDER — INSULIN ASPART 100 UNIT/ML IJ SOLN
0.0000 [IU] | Freq: Every day | INTRAMUSCULAR | Status: DC
  Administered 2024-01-14: 5 [IU] via SUBCUTANEOUS
  Administered 2024-01-15: 4 [IU] via SUBCUTANEOUS

## 2024-01-14 MED ORDER — VITAMIN B-12 1000 MCG PO TABS
1000.0000 ug | ORAL_TABLET | Freq: Every day | ORAL | Status: DC
  Administered 2024-01-16: 1000 ug via ORAL
  Filled 2024-01-14: qty 1

## 2024-01-14 MED ORDER — ACETAMINOPHEN 650 MG RE SUPP: 650.0000 mg | Freq: Four times a day (QID) | RECTAL | Status: DC | PRN

## 2024-01-14 MED ORDER — INSULIN ASPART 100 UNIT/ML IJ SOLN
0.0000 [IU] | Freq: Three times a day (TID) | INTRAMUSCULAR | Status: DC
  Administered 2024-01-15: 1 [IU] via SUBCUTANEOUS
  Administered 2024-01-15: 3 [IU] via SUBCUTANEOUS
  Administered 2024-01-16: 5 [IU] via SUBCUTANEOUS
  Administered 2024-01-16: 7 [IU] via SUBCUTANEOUS

## 2024-01-14 MED ORDER — ASPIRIN 81 MG PO TBEC
81.0000 mg | DELAYED_RELEASE_TABLET | Freq: Every day | ORAL | Status: DC
  Administered 2024-01-16: 81 mg via ORAL
  Filled 2024-01-14: qty 1

## 2024-01-14 MED ORDER — LACTATED RINGERS IV BOLUS: 1000.0000 mL | Freq: Once | INTRAVENOUS | Status: DC

## 2024-01-14 MED ORDER — ENOXAPARIN SODIUM 40 MG/0.4ML IJ SOSY
40.0000 mg | PREFILLED_SYRINGE | INTRAMUSCULAR | Status: DC
  Administered 2024-01-14 – 2024-01-15 (×2): 40 mg via SUBCUTANEOUS
  Filled 2024-01-14 (×2): qty 0.4

## 2024-01-14 NOTE — ED Provider Notes (Signed)
 [facility]ERGENC[facility] DEPARTMENT AT [facility] Provider Note   CSN: [id number] Arrival date & time: 01/14/24  1604     Patient presents with: No chief complaint on file.   Wanda Jones is a 78 y.o. female.   HPI  78 year old female with PMH of insulin -dependent diabetes, recurrent UTI, dementia with associated memory loss, denting to the ED via EMS from [facility] [employer] [facility] [employer] with concern for hyperglycemia and confusion -per EMS, patient has been hyperglycemic x 2 days and accidentally gave herself 18 units of insulin  rather than 4 by mistake yesterday.  EMS reports POC glucose 376 with 400 cc NS given in prehospital setting.  Patient at bedside oriented to person, place, time, reporting that she has not had any recent headaches, fevers, chills, nausea, vomiting, chest pain, shortness of breath, abdominal pain, or changes in bowel movements.  She reports dry cough and increased urinary frequency of unclear onset.  Prior to Admission medications   Medication Sig Start Date End Date Taking? Authorizing Provider  acetaminophen  (TYLENOL ) 325 MG tablet Take 2 tablets (650 mg total) by mouth every 6 (six) hours as needed for mild pain (or Fever >/= 101). 06/30/21   Rizwan, Saima, MD  aspirin  EC 81 MG tablet Take 81 mg by mouth in the morning. Swallow whole.    [provider]  clobetasol (TEMOVATE) 0.05 % external solution Apply 1 application. topically 2 (two) times daily as needed (for scalp irritation).    [provider]  Continuous Blood Gluc Receiver (FREESTYLE LIBRE 2 READER) DEVI Use as directed. E10.69 03/17/21   Kassie Mallick, MD  Continuous Blood Gluc Sensor (FREESTYLE LIBRE 2 SENSOR) MISC Inject 1 patch into the skin every 14 (fourteen) days.    [provider]  donepezil  (ARICEPT ) 5 MG tablet Take 5 mg by mouth at bedtime.    [provider]  insulin  aspart (NOVOLOG  FLEXPEN) 100 UNIT/ML FlexPen 3 times a day (just before each meal), 6-5-5  units. Patient taking differently: 5-6 Units See admin instructions. Inject 6 units into the skin in the morning, 5 units in the afternoon, and 5 units in the evening 02/19/21   Kassie Mallick, MD  insulin  degludec (TRESIBA  FLEXTOUCH) 100 UNIT/ML FlexTouch Pen Inject 8 Units into the skin daily. Patient taking differently: Inject 18 Units into the skin in the morning. 04/02/21   Kassie Mallick, MD  levothyroxine  (SYNTHROID ) 88 MCG tablet Take 88 mcg by mouth daily before breakfast.    [provider]  memantine  (NAMENDA ) 5 MG tablet Take 5 mg by mouth 2 (two) times daily. Patient not taking: Reported on 06/28/2021    [provider]  Multiple Vitamins-Minerals (MULTIVITAMIN ADULTS) TABS Take 1 tablet by mouth at bedtime.    [provider]  MYRBETRIQ  50 MG TB24 tablet Take 50 mg by mouth daily. 11/27/20   [provider]  rosuvastatin  (CRESTOR ) 5 MG tablet Take 1 tablet (5 mg total) by mouth at bedtime. 04/22/21   Patsy Lenis, MD  Semaglutide ,0.25 or 0.5MG /DOS, (OZEMPIC , 0.25 OR 0.5 MG/DOSE,) 2 MG/1.5ML SOPN Inject 0.5 mg into the skin once a week. Patient not taking: Reported on 06/28/2021 04/02/21   Kassie Mallick, MD  trimethoprim (TRIMPEX) 100 MG tablet Take 100 mg by mouth at bedtime.    [provider]  venlafaxine  XR (EFFEXOR -XR) 150 MG 24 hr capsule Take 150 mg by mouth daily with breakfast.    [provider]  vitamin B-12 (CYANOCOBALAMIN ) 1000 MCG tablet Take 1,000 mcg by mouth  in the morning.    [provider]    Allergies: Codeine and Prednisone    Review of Systems  Updated Vital Signs BP 113/88   Pulse (!) 114   Temp 100.2 F (37.9 C) (Oral)   Resp (!) 26   Ht 5' 3 (1.6 m)   Wt 67.1 kg   SpO2 95%   BMI 26.22 kg/m   Physical Exam HENT:     Head: Normocephalic and atraumatic.     Nose: Nose normal.     Mouth/Throat:     Mouth: Mucous membranes are moist.     Pharynx: Oropharynx is clear.  Eyes:      Extraocular Movements: Extraocular movements intact.     Conjunctiva/sclera: Conjunctivae normal.     Pupils: Pupils are equal, round, and reactive to light.  Cardiovascular:     Rate and Rhythm: Normal rate and regular rhythm.     Pulses: Normal pulses.     Heart sounds: Normal heart sounds.  Pulmonary:     Effort: Pulmonary effort is normal.     Comments: Diffuse coarse breath sounds Abdominal:     General: Abdomen is flat.     Palpations: Abdomen is soft.  Musculoskeletal:        General: Normal range of motion.     Cervical back: Normal range of motion.  Skin:    General: Skin is warm and dry.     Capillary Refill: Capillary refill takes 2 to 3 seconds.  Neurological:     GCS: GCS eye subscore is 4. GCS verbal subscore is 4. GCS motor subscore is 6.     Comments: Oriented to person and, place, and time, but disoriented to situation  Psychiatric:        Mood and Affect: Mood normal.     (all labs ordered are listed, but only abnormal results are displayed) Labs Reviewed  BASIC METABOLIC PANEL WITH GFR - Abnormal; Notable for the following components:      Result Value   Glucose, Bld 238 (*)    Creatinine, Ser 1.19 (*)    Calcium  8.7 (*)    GFR, Estimated 47 (*)    All other components within normal limits  CBC WITH DIFFERENTIAL/PLATELET - Abnormal; Notable for the following components:   WBC 16.9 (*)    Neutro Abs 14.9 (*)    Abs Immature Granulocytes 0.11 (*)    All other components within normal limits  URINALYSIS, ROUTINE W REFLEX MICROSCOPIC - Abnormal; Notable for the following components:   APPearance TURBID (*)    Glucose, UA >=500 (*)    Protein, ur 30 (*)    Nitrite POSITIVE (*)    Leukocytes,Ua LARGE (*)    Bacteria, UA MANY (*)    All other components within normal limits  CBG MONITORING, ED - Abnormal; Notable for the following components:   Glucose-Capillary 294 (*)    All other components within normal limits  I-STAT VENOUS BLOOD GAS, ED -  Abnormal; Notable for the following components:   pH, Ven 7.444 (*)    pCO2, Ven 36.8 (*)    pO2, Ven 20 (*)    Calcium , Ion 1.07 (*)    HCT 35.0 (*)    Hemoglobin 11.9 (*)    All other components within normal limits  RESP PANEL BY RT-PCR (RSV, FLU A&B, COVID)  RVPGX2  URINE CULTURE  CULTURE, BLOOD (ROUTINE X 2)  CULTURE, BLOOD (ROUTINE X 2)  BETA-HYDROXYBUTYRIC ACID    EKG:  EKG Interpretation Date/Time:  Sunday January 14 2024 16:16:39 EDT Ventricular Rate:  113 PR Interval:  142 QRS Duration:  67 QT Interval:  306 QTC Calculation: 420 R Axis:   38  Text Interpretation: Sinus tachycardia Low voltage, extremity and precordial leads Confirmed by Yolande Charleston [phone]) on 01/14/2024 6:14:51 PM  Radiology: [facility] Chest 2 View Result Date: 01/14/2024 CLINICAL DATA:  DKA EXAM: CHEST - 2 VIEW COMPARISON:  06/28/2021, 04/10/2021 FINDINGS: Hypoventilatory changes. Chronic elevation of the right diaphragm. Heterogeneous patchy airspace opacities. No pleural effusion. Stable cardiomediastinal silhouette. Left retrocardiac opacity partially accounted for by hiatal hernia. Old fracture deformity distal left clavicle. Chronic deformity of the right clavicle. Chronic thoracolumbar region deformities with evidence of prior augmentation. IMPRESSION: Hypoventilatory changes with heterogeneous patchy airspace opacities, possible pneumonia. Electronically Signed   By: Luke Bun M.D.   On: 01/14/2024 17:36     Procedures   Medications Ordered in the ED  cefTRIAXone  (ROCEPHIN ) 2 g in sodium chloride  0.9 % 100 mL IVPB (2 g Intravenous New Bag/Given 01/14/24 1906)  azithromycin  (ZITHROMAX ) 500 mg in sodium chloride  0.9 % 250 mL IVPB (has no administration in time range)  lactated ringers  bolus 500 mL (0 mLs Intravenous Stopped 01/14/24 1855)  lactated ringers  bolus 500 mL (500 mLs Intravenous New Bag/Given 01/14/24 1917)    Clinical Course as of 01/14/24 1922  Sun Jan 14, 2024  1640 Nursing  facility. Concern for hyperglycemia last night.  [WB]  1742 VBG without evidence of acidosis. [WB]  1759 Neutrophil predominant leukocytosis, WBC 16.9. [WB]    Clinical Course User Index [WB] Matalie Romberger, Elsie, MD                                 Medical Decision Making Amount and/or Complexity of Data Reviewed Labs: ordered. Radiology: ordered.  Risk Decision regarding hospitalization.   78 year old female presenting to the ED via EMS for intermittent confusion with new onset cough and polyuria as well as elevated POC glucose, given 400 cc crystalloid in prehospital setting.  On arrival, patient sinus tachycardic with narrow complex tachycardia 114 bpm.  Hypertensive 152/72.  Borderline temperature, though afebrile T1 100.1 F.  No tachypnea.  Saturating 97% on RA.  POC glucose 294.  Patient administered 500 cc LR with concern for mild clinical dehydration.  VBG without evidence of acidosis.  BHB unremarkable.  Doubt acute hyperglycemic crisis/DKA at this time.  Respiratory RSV/flu/COVID swab negative.  Neutrophil predominant leukocytosis, WBC 16.9 with CXR concerning for acute consolidation/pneumonia.  Creatinine appearing elevated from baseline with >0.3 rise from prior, concerning for AKI.  Patient administered 1500 cc crystalloid in ED.  Urinalysis concerning for infection as well.  Collateral obtained from family and facility with concern for worsening confusion and age, patient moderate risk by curb 65 pneumonia severity.  Patient initiated on IV ceftriaxone  and azithromycin .  Inpatient team engaged with plan for admission and ongoing medical resuscitation with concern for pneumonia and mild confusion.  Final diagnoses:  Community acquired pneumonia, unspecified laterality  Acute cystitis without hematuria    ED Discharge Orders     None          Stacye Noori, Elsie, MD 01/14/24 ARTEMUS Yolande Charleston JAYSON, MD 01/20/24 2101

## 2024-01-14 NOTE — Assessment & Plan Note (Addendum)
 Blood glucose in the 200s. - Holding home insulin  - Started on sensitive SSI 3 times daily with meals plus at bedtime

## 2024-01-14 NOTE — Assessment & Plan Note (Addendum)
-   Admit to FMTS, attending Dr. Orie - Med-surg level of care, Vital signs per floor - Regular diet - VTE prophylaxis: Lovenox  - Status post 1.5L LR in the ED - Continue antibiotics with IV ceftriaxone , azithromycin  (9/28- ) - Blood cultures pending - Labs: AM CBC, BMP, A1c - Fall precautions, Delirium precautions in place

## 2024-01-14 NOTE — ED Notes (Signed)
 Patient transported to X-ray

## 2024-01-14 NOTE — Hospital Course (Addendum)
 Wanda Jones is a 78 y.o. year old with a history of T2DM, recurrent UTI, dementia, HLD, hypothyroidism  who presented with hyperglycemia, cough, and dysuria and was admitted to the [facility] Medicine Teaching Service for CAP and urinalysis.  CAP CXR 9/28 with concerning findings for PNA; also with elevated white count and borderline fever. Treated with IV ceftriaxone  and azithromycin  (9/28-***). Switched to PO with *** on ***.  UTI Patient with hx of recurrent UTIs; urinalysis positive for UTI on admission. Treated with abx as above.  Hyperglycemia T2DM BGs in 300s on admission. Home regimen of 18u long acting (Tresiba ) and 4-6u short acting (Novolog ) held; started on SSI in the hospital. ***Restarted on home regiment at discharge.  AKI, resolved Cr elevated to 1.19 on admission, given 1.5L fluid in ED. Cr improved throughout stay.   Other chronic conditions were medically managed with home medications and formulary alternatives as necessary (HLD, Hypothyroidism, Dementia).  PCP Follow-up Recommendations: Consider whether donepezil  leading to GI upset

## 2024-01-14 NOTE — Assessment & Plan Note (Addendum)
 HLD: Continued home Crestor  5 mg daily at bedtime Hypothyroidism: Continued home Synthroid  88 mcg daily in the morning Dementia: Continued home donepezil  5 mg daily at bedtime

## 2024-01-14 NOTE — Assessment & Plan Note (Addendum)
-   Urine culture pending - Hold prophylactic trimethoprim - Abx (CTX) as above

## 2024-01-14 NOTE — Assessment & Plan Note (Addendum)
 Cr 1.19 from baseline around 0.9. Already treated with IV fluids. - Will trend Cr

## 2024-01-14 NOTE — ED Notes (Signed)
 MD gave verbal for PO fluids. Water provided to pt.

## 2024-01-14 NOTE — H&P (Addendum)
 Hospital Admission History and Physical Service Pager: [phone]  Patient name: Wanda Jones Medical record number: [id number] Date of Birth: 13-Jul-1945 Age: 78 y.o. Gender: female  Primary Care Provider: [name], NP Consultants: None Code Status: DNR per patient and POA (POA verified in chart) Preferred Emergency Contact:   Name Relation Home Work Mobile   Dado,Chris Son   [phone]   Chief Complaint: high blood sugars  Differential and Medical Decision Making:  Wanda Jones is a 78 y.o. female with PMH of T2DM, recurrent UTI, dementia, HLD, hypothyroidism presenting with change in mental status at SNF, now back to baseline. Also found to have pneumonia on CXR with elevated white count and borderline fever, UTI confirmed on urinalysis, possible AKI with bump in Cr from suspected baseline, and hyperglycemia likely 2/2 to increased carbs and recent alcohol intake.  No concern for sepsis at this time given stable vital signs. No concern for DKA with negative ketones on urinalysis and negative B-hydroxybutyric acid. Assessment & Plan CAP (community acquired pneumonia) - Admit to FMTS, attending Dr. Orie - Med-surg level of care, Vital signs per floor - Regular diet - VTE prophylaxis: Lovenox  - Status post 1.5L LR in the ED - Continue antibiotics with IV ceftriaxone , azithromycin  (9/28- ) - Blood cultures pending - Labs: AM CBC, BMP, A1c - Fall precautions, Delirium precautions in place UTI (urinary tract infection) - Urine culture pending - Hold prophylactic trimethoprim - Abx (CTX) as above Hyperglycemia Type 2 diabetes mellitus treated with insulin  (HCC) Blood glucose in the 200s. - Holding home insulin  - Started on sensitive SSI 3 times daily with meals plus at bedtime AKI (acute kidney injury) Cr 1.19 from baseline around 0.9. Already treated with IV fluids. - Will trend Cr Chronic health problem HLD: Continued home Crestor  5 mg daily at  bedtime Hypothyroidism: Continued home Synthroid  88 mcg daily in the morning Dementia: Continued home donepezil  5 mg daily at bedtime  FEN/GI: Regular VTE Prophylaxis: Lovenox   Disposition: Med-surg  History of Present Illness:  [name] is a 78 y.o. female presenting with elevated blood sugars, cough, dysuria. Alert to person and time only, not a great historian. Family (son, who is POA, and son's ex-wife) present at bedside; both are hard of hearing but son declined an interpreter as he can read lips. Family helped provide additional history/collateral.  Took 18u insulin  yesterday 9/27 (supposed to take 4-6u). Per son, it appears patient received wrong doses of Insulin  (switch LA and SA dosing by mistake).  Her blood sugars had been running high (up to the mid-300s) since yesterday 9/27.  She has been eating more carbs per her son and also had wine last night with dinner.  Also reports cough starting 4 days, not productive, becoming more frequent. Had a temp of 100.7 today (per son). Unsure if she received antipyretics at SNF today. Having dysuria/urinary frequency. No shortness of breath.  Reports 1 small episode of vomiting a few days ago, this has been an ongoing thing with stomach upset per her report.  Per EMS, glucose of 376 with NS given prehospital. In the ED, WBC of 16.9. Glucose up to 380. Axillary temperature up to 100.2. No hypotension. S/p IV Ceftriaxone , Azithromycin . Bolused 1.5L of LR.  Review Of Systems: Per HPI.  Pertinent Past Medical History: - T2DM - Recurrent UTI - Dementia - Sleep apnea not on CPAP - HLD - Depression - Hypothyroidism - Hx breast cancer 1997 Remainder reviewed in history tab.  Pertinent Past Surgical History: None.  Remainder reviewed in history tab.   Pertinent Social History: Tobacco use: None Alcohol use: Occasionally (1 glass of wine a month) Other Substance use: None Lives at [facility] by herself.  Pertinent  Family History: - Breast cancer in a cousin - HTN in a family member  Important Outpatient Medications: - Insulin  degludec 8 u daily (18 in morning?) - Insulin  aspart 5-6u TID before meals (6, 4, 4) - Ozempic  0.5 mg weekly (not taking ) - Crestor  5 mg at bedtime (taking) - ASA 81 mg daily (taking) - Synthroid  88 mcg daily before breakfast (taking) - Donepezil  5 mg at bedtime (taking) - Memantine  5 mg BID (not taking) - Myrbetriq  50 mg daily (not taking) - Venlafaxine  150 mg daily with breakfast (not taking) - Trimethoprim 100 mg at bedtime (taking) - Multivitamin daily (taking) - Vit B12 1000 mcg daily (taking) - Olanzapine 2.5 mg (BID)   Objective: BP (!) 125/55   Pulse (!) 107   Temp 100.2 F (37.9 C) (Oral)   Resp (!) 22   Ht 5' 3 (1.6 m)   Wt 67.1 kg   SpO2 98%   BMI 26.22 kg/m  Exam: General: Patient lying down in bed, no acute distress. Eyes: EOMI bilaterally.  Sclera nonicteric, conjunctiva noninjected. Neck: Supple Cardiovascular: Tachycardic with regular rhythm, no murmurs/rubs/gallops. Respiratory: Normal work of breathing on room air.  Some crackles in upper lung fields, R > L.  Gastrointestinal: Bowel sounds present and normoactive bilaterally. Soft, nondistended, nontender. Derm: No rash seen of visible skin.  Few wounds of bilateral lower extremities.  Area of erythema and warmth on left lower shin. Neuro: Alert and oriented to person, time (day of the week and date), not oriented to place.  Strength 5/5 in bilateral upper and lower extremities.  Sensation grossly intact in bilateral upper and lower extremities.  No focal neurological abnormalities. Psych: Appropriate affect.  Labs:  CBC BMET  Recent Labs  Lab 01/14/24 1645 01/14/24 1722  WBC 16.9*  --   HGB 12.0 11.9*  HCT 37.8 35.0*  PLT 348  --    Recent Labs  Lab 01/14/24 1645 01/14/24 1722  NA 137 139  K 4.3 3.9  CL 101  --   CO2 22  --   BUN 19  --   CREATININE 1.19*  --   GLUCOSE  238*  --   CALCIUM  8.7*  --      Resp panel: Negative  Urinalysis: Large leukocytes, positive nitrites, many bacteria  B-hydroxybutryic acid: 0.12 (not elevated)   EKG: My own interpretation (not copied from electronic read): Sinus tachycardia   Imaging Studies Performed: Imaging Study (ie. Chest x-ray) Impression from Radiologist: Hypoventilatory changes with heterogeneous patchy airspace opacities, possible pneumonia.  My Interpretation: Elevated R diaphragm (stable from prior), scattered patchy opacities throughout bilateral fields.   Larraine Palma, MD 01/14/2024, 8:41 PM PGY-1, [facility]  FPTS Intern pager: [phone], text pages welcome Secure chat group [facility] Teaching Service   Upper Level Addendum: I have seen and evaluated this patient along with Dr. Larraine and reviewed the above note, making necessary revisions as appropriate. I agree with the medical decision making and physical exam as noted above. Gladis Church, DO PGY-3 [facility] Residency

## 2024-01-14 NOTE — ED Triage Notes (Signed)
 Pt BIB [facility] from [place]. 4 units insulin  a day. Hyperglyemia noted for the past few days. Yesterday when given insulin  given 18 units instead of 4 by mistake. No change in hyperglycemia even with extra insulin . 376 with EMS on fingerstick and dexcom. Endorses polyuria and polydipsia.  400 NS given en route.   20g LFA   EMS VS 152/110, 112 HR, 91 RA 99 2L, 33 RR.

## 2024-01-15 ENCOUNTER — Encounter (HOSPITAL_COMMUNITY): Payer: Self-pay

## 2024-01-15 DIAGNOSIS — D649 Anemia, unspecified: Secondary | ICD-10-CM | POA: Diagnosis present

## 2024-01-15 DIAGNOSIS — E039 Hypothyroidism, unspecified: Secondary | ICD-10-CM | POA: Diagnosis present

## 2024-01-15 DIAGNOSIS — Z87440 Personal history of urinary (tract) infections: Secondary | ICD-10-CM | POA: Diagnosis not present

## 2024-01-15 DIAGNOSIS — B961 Klebsiella pneumoniae [K. pneumoniae] as the cause of diseases classified elsewhere: Secondary | ICD-10-CM | POA: Diagnosis present

## 2024-01-15 DIAGNOSIS — Z79899 Other long term (current) drug therapy: Secondary | ICD-10-CM | POA: Diagnosis not present

## 2024-01-15 DIAGNOSIS — E785 Hyperlipidemia, unspecified: Secondary | ICD-10-CM | POA: Diagnosis present

## 2024-01-15 DIAGNOSIS — Z794 Long term (current) use of insulin: Secondary | ICD-10-CM | POA: Diagnosis not present

## 2024-01-15 DIAGNOSIS — Z79890 Hormone replacement therapy (postmenopausal): Secondary | ICD-10-CM | POA: Diagnosis not present

## 2024-01-15 DIAGNOSIS — F0390 Unspecified dementia without behavioral disturbance: Secondary | ICD-10-CM | POA: Diagnosis present

## 2024-01-15 DIAGNOSIS — Z885 Allergy status to narcotic agent status: Secondary | ICD-10-CM | POA: Diagnosis not present

## 2024-01-15 DIAGNOSIS — Z7985 Long-term (current) use of injectable non-insulin antidiabetic drugs: Secondary | ICD-10-CM | POA: Diagnosis not present

## 2024-01-15 DIAGNOSIS — F32A Depression, unspecified: Secondary | ICD-10-CM | POA: Diagnosis present

## 2024-01-15 DIAGNOSIS — N3000 Acute cystitis without hematuria: Secondary | ICD-10-CM | POA: Diagnosis present

## 2024-01-15 DIAGNOSIS — T383X1A Poisoning by insulin and oral hypoglycemic [antidiabetic] drugs, accidental (unintentional), initial encounter: Secondary | ICD-10-CM | POA: Diagnosis present

## 2024-01-15 DIAGNOSIS — J189 Pneumonia, unspecified organism: Secondary | ICD-10-CM | POA: Diagnosis present

## 2024-01-15 DIAGNOSIS — Z1152 Encounter for screening for COVID-19: Secondary | ICD-10-CM | POA: Diagnosis not present

## 2024-01-15 DIAGNOSIS — Z7982 Long term (current) use of aspirin: Secondary | ICD-10-CM | POA: Diagnosis not present

## 2024-01-15 DIAGNOSIS — R030 Elevated blood-pressure reading, without diagnosis of hypertension: Secondary | ICD-10-CM | POA: Diagnosis present

## 2024-01-15 DIAGNOSIS — E1165 Type 2 diabetes mellitus with hyperglycemia: Secondary | ICD-10-CM | POA: Diagnosis present

## 2024-01-15 DIAGNOSIS — Z66 Do not resuscitate: Secondary | ICD-10-CM | POA: Diagnosis present

## 2024-01-15 DIAGNOSIS — G4730 Sleep apnea, unspecified: Secondary | ICD-10-CM | POA: Diagnosis present

## 2024-01-15 DIAGNOSIS — G928 Other toxic encephalopathy: Secondary | ICD-10-CM | POA: Diagnosis present

## 2024-01-15 DIAGNOSIS — N179 Acute kidney failure, unspecified: Secondary | ICD-10-CM | POA: Diagnosis present

## 2024-01-15 DIAGNOSIS — I959 Hypotension, unspecified: Secondary | ICD-10-CM | POA: Diagnosis present

## 2024-01-15 LAB — CBC
HCT: 33.2 % — ABNORMAL LOW (ref 36.0–46.0)
Hemoglobin: 10.5 g/dL — ABNORMAL LOW (ref 12.0–15.0)
MCH: 29 pg (ref 26.0–34.0)
MCHC: 31.6 g/dL (ref 30.0–36.0)
MCV: 91.7 fL (ref 80.0–100.0)
Platelets: 319 K/uL (ref 150–400)
RBC: 3.62 MIL/uL — ABNORMAL LOW (ref 3.87–5.11)
RDW: 11.9 % (ref 11.5–15.5)
WBC: 14 K/uL — ABNORMAL HIGH (ref 4.0–10.5)
nRBC: 0 % (ref 0.0–0.2)

## 2024-01-15 LAB — BASIC METABOLIC PANEL WITH GFR
Anion gap: 7 (ref 5–15)
BUN: 18 mg/dL (ref 8–23)
CO2: 25 mmol/L (ref 22–32)
Calcium: 8.7 mg/dL — ABNORMAL LOW (ref 8.9–10.3)
Chloride: 107 mmol/L (ref 98–111)
Creatinine, Ser: 1.14 mg/dL — ABNORMAL HIGH (ref 0.44–1.00)
GFR, Estimated: 49 mL/min — ABNORMAL LOW (ref >60)
Glucose, Bld: 153 mg/dL — ABNORMAL HIGH (ref 70–99)
Potassium: 4 mmol/L (ref 3.5–5.1)
Sodium: 139 mmol/L (ref 135–145)

## 2024-01-15 LAB — CBG MONITORING, ED: Glucose-Capillary: 136 mg/dL — ABNORMAL HIGH (ref 70–99)

## 2024-01-15 LAB — GLUCOSE, CAPILLARY
Glucose-Capillary: 91 mg/dL (ref 70–99)
Glucose-Capillary: 243 mg/dL — ABNORMAL HIGH (ref 70–99)
Glucose-Capillary: 334 mg/dL — ABNORMAL HIGH (ref 70–99)

## 2024-01-15 LAB — HEMOGLOBIN A1C
Hgb A1c MFr Bld: 9.7 % — ABNORMAL HIGH (ref 4.8–5.6)
Mean Plasma Glucose: 231.69 mg/dL

## 2024-01-15 MED ORDER — LACTATED RINGERS IV BOLUS
500.0000 mL | Freq: Once | INTRAVENOUS | Status: AC
  Administered 2024-01-15: 500 mL via INTRAVENOUS

## 2024-01-15 NOTE — Evaluation (Signed)
 Physical Therapy Evaluation Patient Details Name: Wanda Jones MRN: [id number] DOB: 1945/05/10 Today's Date: 01/15/2024  History of Present Illness  78 year old female presenting to the ED 9/28 via EMS from [facility] with concern for hyperglycemia and confusion. Found to have CAP, UTI, and hyperglycemia.  PMH of insulin -dependent diabetes, recurrent UTI, dementia with associated memory loss, breast ca, CAD.  Clinical Impression  Pt admitted with above diagnosis. Limited historian but reports using rollator at her facility, not receiving PT but has had several times in the past. Pt ambulates with RW at CGA level, no overt LOB. Slow to rise with transfers, again without assist. SpO2 98% on RA with activities. Feels close to baseline per her report. Will follow acutely. Encouraged OOB with staff as tolerated to prevent decline in function during admission. Pt currently with functional limitations due to the deficits listed below (see PT Problem List). Pt will benefit from acute skilled PT to increase their independence and safety with mobility to allow discharge.           If plan is discharge home, recommend the following: Supervision due to cognitive status;Assist for transportation   Can travel by private vehicle        Equipment Recommendations None recommended by PT  Recommendations for Other Services       Functional Status Assessment Patient has had a recent decline in their functional status and demonstrates the ability to make significant improvements in function in a reasonable and predictable amount of time.     Precautions / Restrictions Precautions Precautions: Fall Recall of Precautions/Restrictions: Impaired Restrictions Weight Bearing Restrictions Per Provider Order: No      Mobility  Bed Mobility Overal bed mobility: Needs Assistance Bed Mobility: Supine to Sit     Supine to sit: Supervision     General bed mobility comments: Supervision for safety. No  physical assist needed    Transfers Overall transfer level: Needs assistance Equipment used: Rolling walker (2 wheels) Transfers: Sit to/from Stand Sit to Stand: Supervision           General transfer comment: Supervision for safety, cues for set-up and hand placement. No physical assist required. Slow to rise, reports hx of knee surgery. Stable once upright with RW for support    Ambulation/Gait Ambulation/Gait assistance: Contact guard assist Gait Distance (Feet): 80 Feet Assistive device: Rolling walker (2 wheels) Gait Pattern/deviations: Step-through pattern, Decreased stride length, Trunk flexed, Antalgic Gait velocity: dec Gait velocity interpretation: <1.31 ft/sec, indicative of household ambulator   General Gait Details: Grossly stable with instructions for RW use. Mild antalgic pattern and forward flexed but able to stand upright with cues. No overt buckling or LOB with device use today.  Stairs            Wheelchair Mobility     Tilt Bed    Modified Rankin (Stroke Patients Only)       Balance Overall balance assessment: Needs assistance Sitting-balance support: No upper extremity supported, Feet supported Sitting balance-Leahy Scale: Good     Standing balance support: Bilateral upper extremity supported Standing balance-Leahy Scale: Poor                               Pertinent Vitals/Pain Pain Assessment Pain Assessment: No/denies pain    Home Living Family/patient expects to be discharged to:: Other (Comment) ([facility]?)  Prior Function Prior Level of Function : Needs assist;Patient poor historian/Family not available             Mobility Comments: States she uses a rollator ADLs Comments: unsure     Extremity/Trunk Assessment   Upper Extremity Assessment Upper Extremity Assessment: Defer to OT evaluation    Lower Extremity Assessment Lower Extremity Assessment:  Generalized weakness       Communication   Communication Communication: No apparent difficulties    Cognition Arousal: Alert Behavior During Therapy: WFL for tasks assessed/performed   PT - Cognitive impairments: History of cognitive impairments, No family/caregiver present to determine baseline                       PT - Cognition Comments: Dementia at baseline Following commands: Intact       Cueing Cueing Techniques: Verbal cues     General Comments General comments (skin integrity, edema, etc.): Spo2 98% on RA    Exercises     Assessment/Plan    PT Assessment Patient needs continued PT services  PT Problem List Decreased strength;Decreased activity tolerance;Decreased balance;Decreased mobility;Decreased cognition;Decreased knowledge of use of DME;Decreased safety awareness;Decreased knowledge of precautions       PT Treatment Interventions DME instruction;Gait training;Functional mobility training;Therapeutic activities;Therapeutic exercise;Neuromuscular re-education;Balance training;Cognitive remediation;Patient/family education    PT Goals (Current goals can be found in the Care Plan section)  Acute Rehab PT Goals Patient Stated Goal: Go home PT Goal Formulation: With patient Time For Goal Achievement: 01/29/24 Potential to Achieve Goals: Good    Frequency Min 1X/week     Co-evaluation               AM-PAC PT 6 Clicks Mobility  Outcome Measure Help needed turning from your back to your side while in a flat bed without using bedrails?: None Help needed moving from lying on your back to sitting on the side of a flat bed without using bedrails?: None Help needed moving to and from a bed to a chair (including a wheelchair)?: A Little Help needed standing up from a chair using your arms (e.g., wheelchair or bedside chair)?: A Little Help needed to walk in hospital room?: A Little Help needed climbing 3-5 steps with a railing? : A Little 6  Click Score: 20    End of Session Equipment Utilized During Treatment: Gait belt Activity Tolerance: Patient tolerated treatment well Patient left: in chair;with call bell/phone within reach;with chair alarm set;with family/visitor present Nurse Communication: Mobility status PT Visit Diagnosis: Unsteadiness on feet (R26.81);Other abnormalities of gait and mobility (R26.89);Muscle weakness (generalized) (M62.81);Other symptoms and signs involving the nervous system (R29.898)    Time: 8454-8395 PT Time Calculation (min) (ACUTE ONLY): 19 min   Charges:   PT Evaluation $PT Eval Low Complexity: 1 Low   PT General Charges $$ ACUTE PT VISIT: 1 Visit         Leontine Roads, PT, DPT [facility]  Rehabilitation Services Physical Therapist Office: [phone] Website: [email or web address]   Leontine GORMAN Roads 01/15/2024, 4:25 PM

## 2024-01-15 NOTE — Plan of Care (Signed)
  Problem: Health Behavior/Discharge Planning: Goal: Ability to identify and utilize available resources and services will improve Outcome: Not Progressing Goal: Ability to manage health-related needs will improve Outcome: Not Progressing   Problem: Metabolic: Goal: Ability to maintain appropriate glucose levels will improve Outcome: Not Progressing   Problem: Clinical Measurements: Goal: Diagnostic test results will improve Outcome: Not Progressing   Problem: Education: Goal: Ability to describe self-care measures that may prevent or decrease complications (Diabetes Survival Skills Education) will improve Outcome: Progressing Goal: Individualized Educational Video(s) Outcome: Progressing   Problem: Coping: Goal: Ability to adjust to condition or change in health will improve Outcome: Progressing   Problem: Fluid Volume: Goal: Ability to maintain a balanced intake and output will improve Outcome: Progressing   Problem: Nutritional: Goal: Maintenance of adequate nutrition will improve Outcome: Progressing Goal: Progress toward achieving an optimal weight will improve Outcome: Progressing   Problem: Skin Integrity: Goal: Risk for impaired skin integrity will decrease Outcome: Progressing   Problem: Tissue Perfusion: Goal: Adequacy of tissue perfusion will improve Outcome: Progressing   Problem: Education: Goal: Knowledge of General Education information will improve Description: Including pain rating scale, medication(s)/side effects and non-pharmacologic comfort measures Outcome: Progressing   Problem: Health Behavior/Discharge Planning: Goal: Ability to manage health-related needs will improve Outcome: Progressing   Problem: Clinical Measurements: Goal: Ability to maintain clinical measurements within normal limits will improve Outcome: Progressing Goal: Will remain free from infection Outcome: Progressing Goal: Respiratory complications will improve Outcome:  Progressing Goal: Cardiovascular complication will be avoided Outcome: Progressing   Problem: Activity: Goal: Risk for activity intolerance will decrease Outcome: Progressing   Problem: Nutrition: Goal: Adequate nutrition will be maintained Outcome: Progressing   Problem: Coping: Goal: Level of anxiety will decrease Outcome: Progressing   Problem: Elimination: Goal: Will not experience complications related to bowel motility Outcome: Progressing Goal: Will not experience complications related to urinary retention Outcome: Progressing   Problem: Pain Managment: Goal: General experience of comfort will improve and/or be controlled Outcome: Progressing   Problem: Safety: Goal: Ability to remain free from injury will improve Outcome: Progressing   Problem: Skin Integrity: Goal: Risk for impaired skin integrity will decrease Outcome: Progressing

## 2024-01-15 NOTE — Assessment & Plan Note (Signed)
 HLD: Continued home Crestor  5 mg daily at bedtime Hypothyroidism: Continued home Synthroid  88 mcg daily in the morning Dementia: Continued home donepezil  5 mg daily at bedtime

## 2024-01-15 NOTE — Assessment & Plan Note (Signed)
 Cr 1.19 from baseline around 0.9. Already treated with IV fluids. - Cr 1.14, technical AKI criteria 1.17

## 2024-01-15 NOTE — Inpatient Diabetes Management (Signed)
 Inpatient Diabetes Program Recommendations  AACE/ADA: New Consensus Statement on Inpatient Glycemic Control (2015)  Target Ranges:  Prepandial:   less than 140 mg/dL      Peak postprandial:   less than 180 mg/dL (1-2 hours)      Critically ill patients:  140 - 180 mg/dL   Lab Results  Component Value Date   GLUCAP 136 (H) 01/15/2024   HGBA1C 9.7 (H) 01/15/2024    Review of Glycemic Control  Latest Reference Range & Units 01/14/24 16:15 01/14/24 22:03 01/15/24 07:39  Glucose-Capillary 70 - 99 mg/dL 705 (H) 600 (H) 863 (H)   Diabetes history: DM 2 Outpatient Diabetes medications:  Tresiba  18 units q AM, Novolog  6 units with breakfast, 5 units with lunch, 5 units with supper Current orders for Inpatient glycemic control:  Novolog  0-9 units tid with meals and HS Inpatient Diabetes Program Recommendations:   Please consider adding Lantus  10 units daily and add Novolog  3 units tid with meals (hold if patient eats less than 50% or NPO).   Thanks,  Randall Bullocks, RN, BC-ADM Inpatient Diabetes Coordinator Pager [phone]  (8a-5p)

## 2024-01-15 NOTE — Assessment & Plan Note (Addendum)
 Possibly increased carb intake, possibly switched insulins.  Glucose 399, 5 units correction - Fasting a.m. glucose 136, will continue to hold glargine pending repeat ABG - Continue sensitive SSI

## 2024-01-15 NOTE — Assessment & Plan Note (Signed)
-   Urine culture pending - Hold prophylactic trimethoprim - Abx (CTX) as above

## 2024-01-15 NOTE — Assessment & Plan Note (Signed)
 S/p 1.5 liters LR, possibly dilutional, no documented anemia.  Also possibly anemia chronic disease given age and comorbidities - Trend with a.m. CBC

## 2024-01-15 NOTE — ED Notes (Signed)
Pt resting, no needs at this time.

## 2024-01-15 NOTE — Progress Notes (Signed)
 Daily Progress Note Intern Pager: [phone]  Patient name: Wanda Jones Medical record number: [id number] Date of birth: 08-Mar-1946 Age: 78 y.o. Gender: female  Primary Care Provider: [name], NP Consultants: None Code Status: DNR-I  Pt Overview and Major Events to Date:  9/28-admitted  Medical Decision Making:  Wanda Jones is a 78 year old female with PMH T2DM, recurrent UTI, dementia, HLD, hypothyroidism presenting with change in mental status from SNF and admitted for pneumonia and UTI.  Persistently tachypneic with some diastolic hypotension, unchanged from yesterday and will continue to monitor. Assessment & Plan CAP (community acquired pneumonia) Tachypneic since admission, persistent diastolic hypotension.  Tmax 100.2 in ED.  WBC , expiratory rhonchi in right middle lobe could be from phlegm in airway. - RVP negative - Continue 5-day antibiotics with IV ceftriaxone  can consider transitioning to Augmentin tomorrow, and 3-day azithromycin  (9/28- ) - Blood cultures pending, urine cultures pending - PT/OT Evaluation - Labs: AM CBC, BMP UTI (urinary tract infection) - Urine culture pending - Hold prophylactic trimethoprim - Abx (CTX) as above Hyperglycemia Type 2 diabetes mellitus treated with insulin  (HCC) Possibly increased carb intake, possibly switched insulins.  Glucose 399, 5 units correction - Fasting a.m. glucose 136, will continue to hold glargine pending repeat ABG - Continue sensitive SSI AKI (acute kidney injury) (Resolved: 01/15/2024) Cr 1.19 from baseline around 0.9. Already treated with IV fluids. - Cr 1.14, technical AKI criteria 1.17 Low hemoglobin S/p 1.5 liters LR, possibly dilutional, no documented anemia.  Also possibly anemia chronic disease given age and comorbidities - Trend with a.m. CBC Chronic health problem HLD: Continued home Crestor  5 mg daily at bedtime Hypothyroidism: Continued home Synthroid  88 mcg daily in the  morning Dementia: Continued home donepezil  5 mg daily at bedtime   FEN/GI: Regular diet PPx: Lovenox  Dispo:Pending PT recommendations  in 2-3 days. Barriers include tachypnea, clinical improvement.   Subjective:  Saw patient at bedside and she was sleeping comfortably, she states that her symptoms have largely remained unchanged.  Still some shortness of breath, cough, feeling febrile.  Endorses some dysuria, no abdominal pain.  Objective: Temp:  [97.9 F (36.6 C)-100.2 F (37.9 C)] 97.9 F (36.6 C) (09/29 0604) Pulse Rate:  [77-114] 80 (09/29 0600) Resp:  [14-28] 21 (09/29 0600) BP: (90-152)/(48-88) 98/48 (09/29 0600) SpO2:  [93 %-100 %] 96 % (09/29 0600) Weight:  [67.1 kg] 67.1 kg (09/28 1612) Physical Exam: General: Well-appearing, resting comfortably Cardiovascular: Regular rate rhythm, no murmurs rubs or gallops Respiratory: Mild expiratory rhonchi right middle lobe Abdomen: Active bowel sounds, no abdominal distention, nontender, no suprapubic tenderness Extremities: Nonedematous, nontender  Laboratory: Most recent CBC Lab Results  Component Value Date   WBC 14.0 (H) 01/15/2024   HGB 10.5 (L) 01/15/2024   HCT 33.2 (L) 01/15/2024   MCV 91.7 01/15/2024   PLT 319 01/15/2024   Most recent BMP    Latest Ref Rng & Units 01/15/2024    4:31 AM  BMP  Glucose 70 - 99 mg/dL 846   BUN 8 - 23 mg/dL 18   Creatinine 9.55 - 1.00 mg/dL 8.85   Sodium 864 - 854 mmol/L 139   Potassium 3.5 - 5.1 mmol/L 4.0   Chloride 98 - 111 mmol/L 107   CO2 22 - 32 mmol/L 25   Calcium  8.9 - 10.3 mg/dL 8.7    Other pertinent labs, A1c 9.7, glucose 153, glucose 2200 399   Imaging/Diagnostic Tests: Chest x-ray 9/28 IMPRESSION: Hypoventilatory changes with heterogeneous patchy airspace  opacities, possible pneumonia.  My impression: Questionable consolidation given previous CXR has similar opacity, possibly patient's right scapula.  Lorrane Pac, MD 01/15/2024, 7:04 AM  PGY-1, [facility]  Health Family Medicine FPTS Intern pager: [phone], text pages welcome Secure chat group [facility] Teaching Service

## 2024-01-15 NOTE — Assessment & Plan Note (Addendum)
 Tachypneic since admission, persistent diastolic hypotension.  Tmax 100.2 in ED.  WBC , expiratory rhonchi in right middle lobe could be from phlegm in airway. - RVP negative - Continue 5-day antibiotics with IV ceftriaxone  can consider transitioning to Augmentin tomorrow, and 3-day azithromycin  (9/28- ) - Blood cultures pending, urine cultures pending - PT/OT Evaluation - Labs: AM CBC, BMP

## 2024-01-15 NOTE — ED Notes (Signed)
 Pt transported to floor with transport all belongings

## 2024-01-16 ENCOUNTER — Other Ambulatory Visit (HOSPITAL_COMMUNITY): Payer: Self-pay

## 2024-01-16 DIAGNOSIS — R0989 Other specified symptoms and signs involving the circulatory and respiratory systems: Secondary | ICD-10-CM | POA: Insufficient documentation

## 2024-01-16 LAB — BASIC METABOLIC PANEL WITH GFR
Anion gap: 10 (ref 5–15)
BUN: 14 mg/dL (ref 8–23)
CO2: 23 mmol/L (ref 22–32)
Calcium: 8.4 mg/dL — ABNORMAL LOW (ref 8.9–10.3)
Chloride: 104 mmol/L (ref 98–111)
Creatinine, Ser: 1.07 mg/dL — ABNORMAL HIGH (ref 0.44–1.00)
GFR, Estimated: 53 mL/min — ABNORMAL LOW (ref >60)
Glucose, Bld: 233 mg/dL — ABNORMAL HIGH (ref 70–99)
Potassium: 3.7 mmol/L (ref 3.5–5.1)
Sodium: 137 mmol/L (ref 135–145)

## 2024-01-16 LAB — GLUCOSE, CAPILLARY
Glucose-Capillary: 282 mg/dL — ABNORMAL HIGH (ref 70–99)
Glucose-Capillary: 304 mg/dL — ABNORMAL HIGH (ref 70–99)
Glucose-Capillary: 216 mg/dL — ABNORMAL HIGH (ref 70–99)

## 2024-01-16 LAB — CBC
HCT: 33.8 % — ABNORMAL LOW (ref 36.0–46.0)
Hemoglobin: 10.9 g/dL — ABNORMAL LOW (ref 12.0–15.0)
MCH: 28.8 pg (ref 26.0–34.0)
MCHC: 32.2 g/dL (ref 30.0–36.0)
MCV: 89.2 fL (ref 80.0–100.0)
Platelets: 316 K/uL (ref 150–400)
RBC: 3.79 MIL/uL — ABNORMAL LOW (ref 3.87–5.11)
RDW: 12 % (ref 11.5–15.5)
WBC: 9.3 K/uL (ref 4.0–10.5)
nRBC: 0 % (ref 0.0–0.2)

## 2024-01-16 LAB — URINE CULTURE: Culture: >=100000 — AB

## 2024-01-16 MED ORDER — TRESIBA FLEXTOUCH 100 UNIT/ML ~~LOC~~ SOPN: 5.0000 [IU] | PEN_INJECTOR | Freq: Every day | SUBCUTANEOUS | Status: AC

## 2024-01-16 MED ORDER — INSULIN GLARGINE 100 UNIT/ML ~~LOC~~ SOLN
5.0000 [IU] | Freq: Every day | SUBCUTANEOUS | Status: DC
  Administered 2024-01-16: 5 [IU] via SUBCUTANEOUS
  Filled 2024-01-16: qty 0.05

## 2024-01-16 MED ORDER — AZITHROMYCIN 500 MG PO TABS
500.0000 mg | ORAL_TABLET | Freq: Once | ORAL | Status: AC
  Administered 2024-01-16: 500 mg via ORAL
  Filled 2024-01-16: qty 1

## 2024-01-16 MED ORDER — AMOXICILLIN-POT CLAVULANATE 875-125 MG PO TABS
1.0000 | ORAL_TABLET | Freq: Two times a day (BID) | ORAL | 0 refills | Status: AC
  Filled 2024-01-16: qty 9, 5d supply, fill #0

## 2024-01-16 MED ORDER — AMOXICILLIN-POT CLAVULANATE 875-125 MG PO TABS
1.0000 | ORAL_TABLET | Freq: Two times a day (BID) | ORAL | Status: DC
  Administered 2024-01-16: 1 via ORAL
  Filled 2024-01-16: qty 1

## 2024-01-16 MED ORDER — AZITHROMYCIN 500 MG PO TABS: 500.0000 mg | ORAL_TABLET | Freq: Once | ORAL | Status: DC

## 2024-01-16 NOTE — Assessment & Plan Note (Addendum)
 Stable, tachypnea is resolved.  Was at her baseline cognition today with her dementia, quite confused where she was but in no acute distress. - Discontinue IV ceftriaxone  (9/28-9/29) and start Augmentin (9/30-10/4) total 7-day course for complicated UTI, and last dose of azithromycin  will transition to p.o today. (9/28-9/30) - PT/OT Evaluation - Delirium precautions, open windows, encouraged nursing to walk patient.

## 2024-01-16 NOTE — Plan of Care (Addendum)
 Spoke with patient's son, who is hard of hearing but wearing his hearing aids and able to converse easily without interpreter.  He explained that she was looking far better from prior to hospitalization and that she is back to her baseline.  Was hopeful that she could return back to the same SNF, I explained that we will hopefully be able to get that arranged but sometimes it can take a few days especially if beds are not available.  He was understanding.  Patient was also looking better from this morning and was sitting comfortably in her recliner.  Was informed that the patient does not live at [facility] but at ILF. Called to speak with Son and left voicemail that she was ready for transport.

## 2024-01-16 NOTE — Progress Notes (Signed)
 Mobility Specialist Progress Note:   01/16/24 1220  Mobility  Activity Ambulated with assistance  Level of Assistance Contact guard assist, steadying assist  Assistive Device Front wheel walker  Distance Ambulated (ft) 20 ft  Activity Response Tolerated well  Mobility Referral Yes  Mobility visit 1 Mobility  Mobility Specialist Start Time (ACUTE ONLY) 1220  Mobility Specialist Stop Time (ACUTE ONLY) 1236  Mobility Specialist Time Calculation (min) (ACUTE ONLY) 16 min   Pt bed alarm going off, found pt on EOB trying to get OOB. Required only CGA to stand and ambulate to BR per pt request. No unsteadiness noted. RN in to assist.  Therisa Rana Mobility Specialist Please contact via SecureChat or  Rehab office at [phone]

## 2024-01-16 NOTE — Progress Notes (Signed)
 Transition of Care [facility]) - Inpatient Brief Assessment   Patient Details  Name: Wanda Jones MRN: [id number] Date of Birth: 1945-09-17  [facility]) CM/SW Contact:    Wanda JONELLE [name], RN Phone Number: 01/16/2024, 11:59 AM   Clinical Narrative: CM met with the patient at the bedside to discuss IP Care management needs.  The patient admitted to the hospital from [facility] and plans to return to her apartment when stable. Patient admitted to the hospital with pneumonia and remains on RA.  DME at the home includes RW/ rolator.  I called and spoke with patient's son and he will be at the bedside with patient this morning.  MD team updated.  Patient's son will provide transportation to home via car when patient is medically stable.  No other IP Care management needs at this time.   Transition of Care Asessment: Insurance and Status: (P) Insurance coverage has been reviewed Patient has primary care physician: (P) Yes Home environment has been reviewed: (P) from [facility] Prior level of function:: (P) RW Prior/Current Home Services: (P) No current home services (DME at home includes RW/Rolator) Social Drivers of Health Review: (P) SDOH reviewed no interventions necessary Readmission risk has been reviewed: (P) Yes Transition of care needs: (P) no transition of care needs at this time

## 2024-01-16 NOTE — Evaluation (Signed)
 Occupational Therapy Evaluation Patient Details Name: Wanda Jones MRN: [id number] DOB: 10-Dec-1945 Today's Date: 01/16/2024   History of Present Illness   78 year old female presenting to the ED 9/28 via EMS from [facility] with concern for hyperglycemia and confusion. Found to have CAP, UTI, and hyperglycemia.  PMH of insulin -dependent diabetes, recurrent UTI, dementia with associated memory loss, breast ca, CAD.     Clinical Impressions At baseline, pt is Mod I with ADLs, performs functional mobility with a Rollator, and receives assistance for IADLs. Pt now presents with decreased activity tolerance, generalized B UE weakness, decreased balance, and decreased safety and independence with functional tasks. Pt with diagnosis of dementia with pt's son reporting pt is currently at or near baseline cognitive level. Pt currently demonstrates ability to complete ADLs largely Mod I to Contact guard assist and functional mobility/transfers with a RW with Supervision to Contact guard assist for safety. Pt participated well in session. Pt VSS on RA throughout session. Pt will benefit from acute skilled OT services to address deficits and increase safety and independence with functional tasks. Post acute discharge, pt will benefit from OT services at [facility] to maximize rehab potential, decrease risk of falls, and decrease risk of rehospitalization.      If plan is discharge home, recommend the following:   A little help with walking and/or transfers;A little help with bathing/dressing/bathroom;Assistance with cooking/housework;Direct supervision/assist for medications management;Direct supervision/assist for financial management;Assist for transportation;Supervision due to cognitive status;Help with stairs or ramp for entrance     Functional Status Assessment   Patient has had a recent decline in their functional status and demonstrates the ability to make significant improvements in function  in a reasonable and predictable amount of time.     Equipment Recommendations   None recommended by OT (Pt already has needed equipment)     Recommendations for Other Services         Precautions/Restrictions   Precautions Precautions: Fall Recall of Precautions/Restrictions: Impaired Restrictions Weight Bearing Restrictions Per Provider Order: No     Mobility Bed Mobility Overal bed mobility: Needs Assistance             General bed mobility comments: Pt sitting in recliner at beginning and end of session    Transfers Overall transfer level: Needs assistance Equipment used: Rolling walker (2 wheels) Transfers: Sit to/from Stand, Bed to chair/wheelchair/BSC Sit to Stand: Contact guard assist     Step pivot transfers: Supervision, Contact guard assist     General transfer comment: CGA to boost up from recliner; cues for hand placement. Supervision to CGA during transfers/mobility for safety with no physical assist needed      Balance Overall balance assessment: Needs assistance Sitting-balance support: No upper extremity supported, Feet supported Sitting balance-Leahy Scale: Good     Standing balance support: Single extremity supported, Bilateral upper extremity supported, During functional activity, Reliant on assistive device for balance Standing balance-Leahy Scale: Poor Standing balance comment: reliant on UE support                           ADL either performed or assessed with clinical judgement   ADL Overall ADL's : Needs assistance/impaired Eating/Feeding: Modified independent;Sitting   Grooming: Supervision/safety;Contact guard assist;Standing   Upper Body Bathing: Set up;Contact guard assist;Sitting   Lower Body Bathing: Supervison/ safety;Contact guard assist;Sitting/lateral leans;Sit to/from stand   Upper Body Dressing : Contact guard assist;Set up;Sitting   Lower Body Dressing: Contact guard  assist;Supervision/safety;Sitting/lateral leans;Sit  to/from stand   Toilet Transfer: Supervision/safety;Contact guard assist;Ambulation;Rolling walker (2 wheels);Regular Toilet   Toileting- Clothing Manipulation and Hygiene: Set up;Sitting/lateral lean;Contact guard assist;Sit to/from stand       Functional mobility during ADLs: Supervision/safety;Contact guard assist;Cueing for safety;Rolling walker (2 wheels) (Supervision to CGA during transfers/mobility for safety with no physical assist needed) General ADL Comments: Pt with decreased activity tolerance, fatiguing quickly during tasks.     Vision Baseline Vision/History: 2 Legally blind;5 Retinopathy (Pt reports she is legally blind in R eye and has vision deficits in L eye due to diabetic retinopathy) Ability to See in Adequate Light: 3 Highly impaired (Highly impaired in R eye; Moderately impaired in L eye) Patient Visual Report: No change from baseline Additional Comments: Pt with decreased vision at baseline; pt reports no change from baseline.     Perception         Praxis         Pertinent Vitals/Pain Pain Assessment Pain Assessment: No/denies pain     Extremity/Trunk Assessment Upper Extremity Assessment Upper Extremity Assessment: Right hand dominant;Generalized weakness   Lower Extremity Assessment Lower Extremity Assessment: Defer to PT evaluation       Communication Communication Communication: No apparent difficulties   Cognition Arousal: Alert Behavior During Therapy: WFL for tasks assessed/performed Cognition: Cognition impaired, History of cognitive impairments   Orientation impairments: Place (Pt knows she is at a hospital, but did not know she is a [facility] or in [place]. Pt initially stating it is February 2025, but quickly able to self-correct.) Awareness: Intellectual awareness intact, Online awareness impaired Memory impairment (select all impairments): Working [employer] fast streamer, Short-term memory Attention  impairment (select first level of impairment): Selective attention Executive functioning impairment (select all impairments): Organization, Problem solving, Reasoning OT - Cognition Comments: Pt oriented to self, family, time, and situation and pleasant throughout session. Pt with diagnosis of dementia with cognitive deficits at baseline. Per pt's son, pt is at or near baseline cognition at this time.                 Following commands: Intact (requires mildly increased time for processing)       Cueing  General Comments   Cueing Techniques: Verbal cues  VSS on RA. Pt's son present and supportive during a portion of session. RN and NT each present during a portion of session.   Exercises     Shoulder Instructions      Home Living Family/patient expects to be discharged to:: Other (Comment) ([facility] - uncertain if from ALF or Memory Care)                                 Additional Comments: Pt reports she has a studio apartment with a microwave, sink, and small fridge/feezer. Pt's bathroom has a walk-in shower with grab bars, handheld shower head, and shower chair. Pt reports there is a dining room, launge, and exercise room down the hall.      Prior Functioning/Environment Prior Level of Function : Needs assist;Independent/Modified Independent             Mobility Comments: At baseline, pt uses a Rollator for mobility ADLs Comments: At baseline, pt is Mod I with ADLs, receives assistance for IADLs, including medication management, meals in the dining room, and assistance for cleaning and laundry.    OT Problem List: Decreased strength;Decreased activity tolerance;Impaired balance (sitting and/or standing)   OT Treatment/Interventions: Self-care/ADL training;Therapeutic exercise;DME  and/or AE instruction;Energy conservation;Therapeutic activities;Cognitive remediation/compensation;Patient/family education;Balance training      OT Goals(Current goals  can be found in the care plan section)   Acute Rehab OT Goals Patient Stated Goal: to return home and stay as independent as possible OT Goal Formulation: With patient/family Time For Goal Achievement: 01/30/24 Potential to Achieve Goals: Good ADL Goals Pt Will Perform Grooming: with modified independence;standing Pt Will Perform Lower Body Bathing: with modified independence;sitting/lateral leans;sit to/from stand Pt Will Perform Lower Body Dressing: with modified independence;sitting/lateral leans;sit to/from stand Pt Will Transfer to Toilet: with modified independence;ambulating;regular height toilet (with least restrictive AD) Pt Will Perform Toileting - Clothing Manipulation and hygiene: with modified independence;sit to/from stand;sitting/lateral leans Pt/caregiver will Perform Home Exercise Program: Increased strength;Both right and left upper extremity;With theraband;With Supervision;With written HEP provided   OT Frequency:  Min 1X/week    Co-evaluation              AM-PAC OT 6 Clicks Daily Activity     Outcome Measure Help from another person eating meals?: None Help from another person taking care of personal grooming?: A Little Help from another person toileting, which includes using toliet, bedpan, or urinal?: A Little Help from another person bathing (including washing, rinsing, drying)?: A Little Help from another person to put on and taking off regular upper body clothing?: A Little Help from another person to put on and taking off regular lower body clothing?: A Little 6 Click Score: 19   End of Session Equipment Utilized During Treatment: Gait belt;Rolling walker (2 wheels) Nurse Communication: Mobility status  Activity Tolerance: Patient tolerated treatment well Patient left: in chair;with call bell/phone within reach;with chair alarm set;with nursing/sitter in room;with family/visitor present  OT Visit Diagnosis: Unsteadiness on feet (R26.81);Muscle  weakness (generalized) (M62.81)                Time: 8760-8686 OT Time Calculation (min): 34 min Charges:  OT General Charges $OT Visit: 1 Visit OT Evaluation $OT Eval Low Complexity: 1 Low OT Treatments $Self Care/Home Management : 8-22 mins  Margarie Rockey HERO., OTR/L, MA Acute Rehab [phone]   Margarie FORBES Horns 01/16/2024, 1:39 PM

## 2024-01-16 NOTE — Assessment & Plan Note (Addendum)
 History of recurrent UTIs, presenting with dysuria and altered mental status.  On prophylactic trimethoprim. - Urine cultures growing Klebsiella pneumonia sensitive to ceftriaxone  and ampicillin/sulbactam - Will hold prophylactic regimen and consider switching to methenamine outpatient - Blood cultures no growth for 2 days - Will extend ABX course for a total of 7 days

## 2024-01-16 NOTE — Discharge Summary (Addendum)
 Family Medicine Teaching [facility] Discharge Summary  Patient name: Wanda Jones Medical record number: [id number] Date of birth: October 11, 1945 Age: 78 y.o. Gender: female Date of Admission: 01/14/2024  Date of Discharge: 01/16/2024 Admitting Physician: Alan Flies, MD  Primary Care Provider: [name], NP Consultants: None  Indication for Hospitalization: Altered mental status  Discharge Diagnoses/Problem List:  Principal Problem for Admission: Complicated UTI Other Problems addressed during stay:  Principal Problem:   CAP (community acquired pneumonia) Active Problems:   Type 2 diabetes mellitus treated with insulin  (HCC)   Complicated UTI (urinary tract infection)   Hyperglycemia   Bibasilar crackles  Brief Hospital Course:  Wanda Jones is a 78 y.o. year old with a history of T2DM, recurrent UTI, dementia, HLD, hypothyroidism  who presented with hyperglycemia, cough, and dysuria and was admitted to the [facility] Medicine Teaching Service for CAP and urinalysis.  CAP CXR 9/28 with concerning findings for PNA; also with elevated white count and borderline fever. Treated with IV ceftriaxone  (9/28-9/29) and azithromycin  (9/28-9/30). Switched to PO Augmentin on 9/30 to complete 7 day course on 10/4.  UTI Patient with hx of recurrent UTIs; urinalysis positive for UTI on admission and found to be growing Klebsiella pneumonia sensitive to ampicillin/sulbactam, therefore chose to transition ceftriaxone  to Augmentin to complete 7 day course of antibiotics on 10/4.   Hyperglycemia T2DM BGs in 300s on admission. Home regimen of 18u long acting (Tresiba ) and 4-6u short acting (Novolog ) held; started on SSI in the hospital.  Concerned that dosing of insulin  was switched on accident at some point in the past.  Restarted on 5 units Lantus  and SSI at discharge.  AKI, resolved Cr elevated to 1.19 on admission, given 1.5L fluid in ED. Cr improved throughout stay and was stable at  discharge.  Other chronic conditions were medically managed with home medications and formulary alternatives as necessary (HLD, Hypothyroidism, Dementia).  PCP Follow-up Recommendations: Consider whether donepezil  leading to altered mentation. Holding prophylactic trimethoprim, can consider Methenamine for recurrent UTIs given growing Klebsiella. Ensure completion of Augmentin course to end on 10/4. Re-evaluate for bibasilar crackles on exam.   Results/Tests Pending at Time of Discharge:  Unresulted Labs (From admission, onward)    None      Disposition: SNF  Discharge Condition: Stable  Discharge Exam:  Vitals:   01/16/24 0822 01/16/24 1244  BP: (!) 97/55 (!) 129/49  Pulse: 100 91  Resp: 17 18  Temp: 98.3 F (36.8 C) 97.9 F (36.6 C)  SpO2: 90% 93%   General: Confused where she is, no acute distress Cardiovascular: Regular rate rhythm, no murmurs rubs or gallops Respiratory: Inspiratory crackles bilateral lung bases. Abdomen: Nontender, active bowel sounds Extremities: Nonedematous, nontender  Significant Procedures: None  Significant Labs and Imaging:  Recent Labs  Lab 01/14/24 1645 01/14/24 1722 01/15/24 0431 01/16/24 0504  WBC 16.9*  --  14.0* 9.3  HGB 12.0 11.9* 10.5* 10.9*  HCT 37.8 35.0* 33.2* 33.8*  PLT 348  --  319 316   Recent Labs  Lab 01/14/24 1645 01/14/24 1722 01/15/24 0431 01/16/24 0504  NA 137 139 139 137  K 4.3 3.9 4.0 3.7  CL 101  --  107 104  CO2 22  --  25 23  GLUCOSE 238*  --  153* 233*  BUN 19  --  18 14  CREATININE 1.19*  --  1.14* 1.07*  CALCIUM  8.7*  --  8.7* 8.4*    Chest x-ray 9/28 IMPRESSION: Hypoventilatory changes with heterogeneous  patchy airspace opacities, possible pneumonia.   Discharge Medications:  Allergies as of 01/16/2024       Reactions   Codeine Anaphylaxis   Prednisone Anaphylaxis        Medication List     PAUSE taking these medications    trimethoprim 100 MG tablet Wait to take this  until your doctor or other care provider tells you to start again. Commonly known as: TRIMPEX Take 100 mg by mouth at bedtime.       TAKE these medications    acetaminophen  325 MG tablet Commonly known as: TYLENOL  Take 2 tablets (650 mg total) by mouth every 6 (six) hours as needed for mild pain (or Fever >/= 101).   amoxicillin-clavulanate 875-125 MG tablet Commonly known as: AUGMENTIN Take 1 tablet by mouth every 12 (twelve) hours for 9 doses.   aspirin  EC 81 MG tablet Take 81 mg by mouth in the morning. Swallow whole.   clobetasol 0.05 % external solution Commonly known as: TEMOVATE Apply 1 application. topically 2 (two) times daily as needed (for scalp irritation).   cyanocobalamin  1000 MCG tablet Commonly known as: VITAMIN B12 Take 1,000 mcg by mouth in the morning.   donepezil  5 MG tablet Commonly known as: ARICEPT  Take 5 mg by mouth at bedtime.   levothyroxine  75 MCG tablet Commonly known as: SYNTHROID  Take 75 mcg by mouth daily. What changed: Another medication with the same name was removed. Continue taking this medication, and follow the directions you see here.   memantine  5 MG tablet Commonly known as: NAMENDA  Take 5 mg by mouth 2 (two) times daily.   Multivitamin Adults Tabs Take 1 tablet by mouth at bedtime.   Myrbetriq  50 MG Tb24 tablet Generic drug: mirabegron  ER Take 50 mg by mouth daily.   NovoLOG  FlexPen 100 UNIT/ML FlexPen Generic drug: insulin  aspart 3 times a day (just before each meal), 6-5-5 units. What changed:  how much to take when to take this additional instructions   OLANZapine 2.5 MG tablet Commonly known as: ZYPREXA Take 2.5 mg by mouth at bedtime.   rosuvastatin  5 MG tablet Commonly known as: CRESTOR  Take 1 tablet (5 mg total) by mouth at bedtime.   Tresiba  FlexTouch 100 UNIT/ML FlexTouch Pen Generic drug: insulin  degludec Inject 5 Units into the skin daily. What changed: how much to take   venlafaxine  XR 150 MG 24  hr capsule Commonly known as: EFFEXOR -XR Take 150 mg by mouth daily with breakfast.        Discharge Instructions: Please refer to Patient Instructions section of EMR for full details.  Patient was counseled important signs and symptoms that should prompt return to medical care, changes in medications, dietary instructions, activity restrictions, and follow up appointments.   Follow-Up Appointments:  Follow-up Information     [name], NP. Schedule an appointment as soon as possible for a visit.   Specialty: Nurse Practitioner Contact information: [address] [place] [address] [phone]                Lorrane Pac, MD 01/16/2024, 2:20 PM PGY-1, [facility] Family Medicine  I have discussed the above with Dr. Lorrane and agree with the documented plan. My edits for correction/addition/clarification are included above. Please see any attending notes.   Kathrine Melena, DO PGY-2, [facility] 01/16/2024 4:00 PM

## 2024-01-16 NOTE — Assessment & Plan Note (Addendum)
 Possibly increased carb intake, possibly switched insulins.  Received total of 8 units correction, fasting 233 this morning.  Home dose prescribed 8 units but reportedly taking 18 units Tresiba . - Start 5 units Lantus  daily this morning - Continue sensitive SSI

## 2024-01-16 NOTE — Plan of Care (Signed)

## 2024-01-16 NOTE — Assessment & Plan Note (Signed)
 Auscultated on admission, bilateral lower lobes today.  No diagnostic history of heart failure or pulmonary fibrosis.  Will consider outpatient follow-up for CT imaging

## 2024-01-16 NOTE — Progress Notes (Signed)
 Daily Progress Note Intern Pager: [phone]  Patient name: Wanda Jones Medical record number: [id number] Date of birth: December 12, 1945 Age: 78 y.o. Gender: female  Primary Care Provider: [name], NP Consultants: None Code Status: DNR-I  Pt Overview and Major Events to Date:  9/28-admitted   Assessment:   Wanda Jones is a 78 year old female with PMH T2DM, recurrent UTI, dementia, HLD, hypothyroidism presenting with change in mental status from SNF and admitted for suspected CAP and UTI.  Tachypnea resolved since yesterday afternoon and on room air, persistent diastolic hypotension Assessment & Plan CAP (community acquired pneumonia) Stable, tachypnea is resolved.  Was at her baseline cognition today with her dementia, quite confused where she was but in no acute distress. - Discontinue IV ceftriaxone  (9/28-9/29) and start Augmentin (9/30-10/4) total 7-day course for complicated UTI, and last dose of azithromycin  will transition to p.o today. (9/28-9/30) - PT/OT Evaluation - Delirium precautions, open windows, encouraged nursing to walk patient. Complicated UTI (urinary tract infection) History of recurrent UTIs, presenting with dysuria and altered mental status.  On prophylactic trimethoprim. - Urine cultures growing Klebsiella pneumonia sensitive to ceftriaxone  and ampicillin/sulbactam - Will hold prophylactic regimen and consider switching to methenamine outpatient - Blood cultures no growth for 2 days - Will extend ABX course for a total of 7 days Hyperglycemia Type 2 diabetes mellitus treated with insulin  (HCC) Possibly increased carb intake, possibly switched insulins.  Received total of 8 units correction, fasting 233 this morning.  Home dose prescribed 8 units but reportedly taking 18 units Tresiba . - Start 5 units Lantus  daily this morning - Continue sensitive SSI Low hemoglobin (Resolved: 01/16/2024) S/p 1.5 liters LR, probably dilutional with gradually improving  hemoglobin, no documented anemia.  Also possibly anemia chronic disease given age and comorbidities Bibasilar crackles Auscultated on admission, bilateral lower lobes today.  No diagnostic history of heart failure or pulmonary fibrosis.  Will consider outpatient follow-up for CT imaging Chronic health problem HLD: Continued home Crestor  5 mg daily at bedtime Hypothyroidism: Continued home Synthroid  88 mcg daily in the morning Dementia: Continued home donepezil  5 mg daily at bedtime   FEN/GI: Regular diet PPx: Lovenox  Dispo:SNF today.   Subjective:  Saw patient at bedside, she was quite confused where she was.  Making statements she has when will I get out of this car.  Nurse reports stated that she has received multiple redirections this morning, but no agitation or aggression.  Denies any shortness of breath, dysuria is improved, or pain.  Objective: Temp:  [97.7 F (36.5 C)-98.9 F (37.2 C)] 97.7 F (36.5 C) (09/29 2151) Pulse Rate:  [77-101] 101 (09/29 2151) Resp:  [14-26] 18 (09/29 2151) BP: (106-154)/(54-81) 154/81 (09/29 2151) SpO2:  [92 %-100 %] 100 % (09/29 2151)  Physical Exam: General: Confused where she is, no acute distress Cardiovascular: Regular rate rhythm, no murmurs rubs or gallops Respiratory: Inspiratory crackles bilateral lung bases. Abdomen: Nontender, active bowel sounds Extremities: Nonedematous, nontender  Laboratory: Most recent CBC Lab Results  Component Value Date   WBC 9.3 01/16/2024   HGB 10.9 (L) 01/16/2024   HCT 33.8 (L) 01/16/2024   MCV 89.2 01/16/2024   PLT 316 01/16/2024   Most recent BMP    Latest Ref Rng & Units 01/16/2024    5:04 AM  BMP  Glucose 70 - 99 mg/dL 766   BUN 8 - 23 mg/dL 14   Creatinine 9.55 - 1.00 mg/dL 8.92   Sodium 864 - 854 mmol/L 137  Potassium 3.5 - 5.1 mmol/L 3.7   Chloride 98 - 111 mmol/L 104   CO2 22 - 32 mmol/L 23   Calcium  8.9 - 10.3 mg/dL 8.4     Other pertinent labs glucose 233 at 5:04 AM,  overnight glucose 243 and 334  Imaging/Diagnostic Tests: No new imaging  Wanda Pac, MD 01/16/2024, 7:21 AM  PGY-1, [facility] FPTS Intern pager: [phone], text pages welcome Secure chat group [facility] Teaching Service

## 2024-01-16 NOTE — Assessment & Plan Note (Signed)
 S/p 1.5 liters LR, probably dilutional with gradually improving hemoglobin, no documented anemia.  Also possibly anemia chronic disease given age and comorbidities

## 2024-01-16 NOTE — Discharge Instructions (Addendum)
 Dear Wanda Jones,  Thank you for letting us  participate in your care. You were hospitalized for confusion and diagnosed with CAP (community acquired pneumonia) and complicated urinary tract infection. You were treated with both intravenous and oral antibiotics to treat both pneumonia and a UTI.   POST-HOSPITAL & CARE INSTRUCTIONS Continue the Augmentin taking 1 pill in the morning and 1 pill in the evening until your final dose October 4. Continue 5 units of long-acting insulin  (Tresiba ) daily in the mornings.  This regiment can be adjusted by your PCP. Go to your follow up appointments (listed below)   DOCTOR'S APPOINTMENT   No future appointments.  Follow-up Information     [name], NP. Schedule an appointment as soon as possible for a visit.   Specialty: Nurse Practitioner Contact information: [address] [place] [address] [phone]                 Take care and be well!  Family Medicine Teaching Service Inpatient Team [place]  [facility]  [address] [place], [place] [address] [phone]

## 2024-01-16 NOTE — Assessment & Plan Note (Signed)
 HLD: Continued home Crestor  5 mg daily at bedtime Hypothyroidism: Continued home Synthroid  88 mcg daily in the morning Dementia: Continued home donepezil  5 mg daily at bedtime

## 2024-01-19 LAB — CULTURE, BLOOD (ROUTINE X 2)
Culture: NO GROWTH
Culture: NO GROWTH
Special Requests: ADEQUATE
Special Requests: ADEQUATE

## 2024-01-21 ENCOUNTER — Encounter (HOSPITAL_COMMUNITY): Payer: Self-pay

## 2024-01-21 ENCOUNTER — Other Ambulatory Visit: Payer: Self-pay

## 2024-01-21 ENCOUNTER — Emergency Department (HOSPITAL_COMMUNITY)
Admission: EM | Admit: 2024-01-21 | Discharge: 2024-01-21 | Disposition: A | Discharge status: Skilled Nursing Facility | Attending: Emergency Medicine | Admitting: Emergency Medicine

## 2024-01-21 DIAGNOSIS — E1165 Type 2 diabetes mellitus with hyperglycemia: Secondary | ICD-10-CM | POA: Insufficient documentation

## 2024-01-21 DIAGNOSIS — R739 Hyperglycemia, unspecified: Secondary | ICD-10-CM | POA: Diagnosis present

## 2024-01-21 DIAGNOSIS — Z7982 Long term (current) use of aspirin: Secondary | ICD-10-CM | POA: Insufficient documentation

## 2024-01-21 DIAGNOSIS — Z794 Long term (current) use of insulin: Secondary | ICD-10-CM | POA: Diagnosis not present

## 2024-01-21 LAB — I-STAT CHEM 8, ED
BUN: 17 mg/dL (ref 8–23)
Calcium, Ion: 1.11 mmol/L — ABNORMAL LOW (ref 1.15–1.40)
Chloride: 99 mmol/L (ref 98–111)
Creatinine, Ser: 1.1 mg/dL — ABNORMAL HIGH (ref 0.44–1.00)
Glucose, Bld: 391 mg/dL — ABNORMAL HIGH (ref 70–99)
HCT: 33 % — ABNORMAL LOW (ref 36.0–46.0)
Hemoglobin: 11.2 g/dL — ABNORMAL LOW (ref 12.0–15.0)
Potassium: 4.7 mmol/L (ref 3.5–5.1)
Sodium: 133 mmol/L — ABNORMAL LOW (ref 135–145)
TCO2: 27 mmol/L (ref 22–32)

## 2024-01-21 LAB — COMPREHENSIVE METABOLIC PANEL WITH GFR
ALT: 11 U/L (ref 0–44)
AST: 19 U/L (ref 15–41)
Albumin: 3.6 g/dL (ref 3.5–5.0)
Alkaline Phosphatase: 107 U/L (ref 38–126)
Anion gap: 12 (ref 5–15)
BUN: 15 mg/dL (ref 8–23)
CO2: 25 mmol/L (ref 22–32)
Calcium: 9.4 mg/dL (ref 8.9–10.3)
Chloride: 97 mmol/L — ABNORMAL LOW (ref 98–111)
Creatinine, Ser: 1.1 mg/dL — ABNORMAL HIGH (ref 0.44–1.00)
GFR, Estimated: 51 mL/min — ABNORMAL LOW (ref >60)
Glucose, Bld: 381 mg/dL — ABNORMAL HIGH (ref 70–99)
Potassium: 4.6 mmol/L (ref 3.5–5.1)
Sodium: 133 mmol/L — ABNORMAL LOW (ref 135–145)
Total Bilirubin: 0.2 mg/dL (ref 0.0–1.2)
Total Protein: 6.8 g/dL (ref 6.5–8.1)

## 2024-01-21 LAB — BLOOD GAS, VENOUS
Acid-Base Excess: 4.3 mmol/L — ABNORMAL HIGH (ref 0.0–2.0)
Bicarbonate: 31.5 mmol/L — ABNORMAL HIGH (ref 20.0–28.0)
O2 Saturation: 27.1 %
Patient temperature: 37
pCO2, Ven: 57 mmHg (ref 44–60)
pH, Ven: 7.35 (ref 7.25–7.43)
pO2, Ven: <31 mmHg — CL (ref 32–45)

## 2024-01-21 LAB — CBC
HCT: 35.4 % — ABNORMAL LOW (ref 36.0–46.0)
Hemoglobin: 11 g/dL — ABNORMAL LOW (ref 12.0–15.0)
MCH: 28.5 pg (ref 26.0–34.0)
MCHC: 31.1 g/dL (ref 30.0–36.0)
MCV: 91.7 fL (ref 80.0–100.0)
Platelets: 341 K/uL (ref 150–400)
RBC: 3.86 MIL/uL — ABNORMAL LOW (ref 3.87–5.11)
RDW: 11.8 % (ref 11.5–15.5)
WBC: 9.5 K/uL (ref 4.0–10.5)
nRBC: 0 % (ref 0.0–0.2)

## 2024-01-21 LAB — URINALYSIS, ROUTINE W REFLEX MICROSCOPIC
Bacteria, UA: NONE SEEN
Bilirubin Urine: NEGATIVE
Glucose, UA: >=500 mg/dL — AB
Hgb urine dipstick: NEGATIVE
Ketones, ur: NEGATIVE mg/dL
Leukocytes,Ua: NEGATIVE
Nitrite: NEGATIVE
Protein, ur: NEGATIVE mg/dL
Specific Gravity, Urine: 1.001 — ABNORMAL LOW (ref 1.005–1.030)
pH: 7 (ref 5.0–8.0)

## 2024-01-21 LAB — CBG MONITORING, ED
Glucose-Capillary: 377 mg/dL — ABNORMAL HIGH (ref 70–99)
Glucose-Capillary: 263 mg/dL — ABNORMAL HIGH (ref 70–99)
Glucose-Capillary: 235 mg/dL — ABNORMAL HIGH (ref 70–99)

## 2024-01-21 MED ORDER — INSULIN ASPART PROT & ASPART (70-30 MIX) 100 UNIT/ML ~~LOC~~ SUSP: 2.0000 [IU] | Freq: Once | SUBCUTANEOUS | Status: DC

## 2024-01-21 MED ORDER — LACTATED RINGERS IV BOLUS
1000.0000 mL | Freq: Once | INTRAVENOUS | Status: AC
  Administered 2024-01-21: 1000 mL via INTRAVENOUS

## 2024-01-21 MED ORDER — TRESIBA FLEXTOUCH 100 UNIT/ML ~~LOC~~ SOPN: 10.0000 [IU] | PEN_INJECTOR | Freq: Every day | SUBCUTANEOUS | 0 refills | Status: AC

## 2024-01-21 MED ORDER — INSULIN ASPART 100 UNIT/ML IJ SOLN
2.0000 [IU] | Freq: Once | INTRAMUSCULAR | Status: AC
  Administered 2024-01-21: 2 [IU] via SUBCUTANEOUS
  Filled 2024-01-21: qty 0.02

## 2024-01-21 NOTE — ED Triage Notes (Signed)
 Pt BIBA from [facility], c/o hyperglycemia.  Given 4 units insulin  at facility. Denies any symptoms besides being thirsty. Per staff, mental status at baseline.   CBG 529

## 2024-01-21 NOTE — ED Provider Notes (Signed)
 [place] EMERGENCY DEPARTMENT AT [facility] Provider Note   CSN: [id number] Arrival date & time: 01/21/24  1543     Patient presents with: Hyperglycemia   Wanda Jones is a 78 y.o. female.  Patient presents to the emergency department today with concerns of hyperglycemia.  She has a history of type 2 diabetes currently controlled with a long-acting and short acting insulin .  She reports that she has been taking her medication as prescribed.  Reportedly had a blood glucose level 529 by EMS prior to being transported and given 4 units of insulin .  She endorses some feelings of increased thirst and urination but denies any abdominal pain, nausea, vomiting, or blurry vision.  She states that she had some vomiting about a week ago prior to being admitted for pneumonia but states that this is now resolved.  Denies any diarrhea.    Hyperglycemia Associated symptoms: no abdominal pain, no nausea and no vomiting        Prior to Admission medications   Medication Sig Start Date End Date Taking? Authorizing Provider  insulin  degludec (TRESIBA  FLEXTOUCH) 100 UNIT/ML FlexTouch Pen Inject 10 Units into the skin daily. 01/21/24 02/20/24 Yes Charelle Petrakis A, PA-C  acetaminophen  (TYLENOL ) 325 MG tablet Take 2 tablets (650 mg total) by mouth every 6 (six) hours as needed for mild pain (or Fever >/= 101). 06/30/21   Rizwan, Saima, MD  amoxicillin-clavulanate (AUGMENTIN) 875-125 MG tablet Take 1 tablet by mouth every 12 (twelve) hours for 9 doses. 01/16/24 01/21/24  Janna Ferrier, DO  aspirin  EC 81 MG tablet Take 81 mg by mouth in the morning. Swallow whole.    [provider]  clobetasol (TEMOVATE) 0.05 % external solution Apply 1 application. topically 2 (two) times daily as needed (for scalp irritation).    [provider]  donepezil  (ARICEPT ) 5 MG tablet Take 5 mg by mouth at bedtime.    [provider]  insulin  aspart (NOVOLOG  FLEXPEN) 100 UNIT/ML FlexPen 3 times a  day (just before each meal), 6-5-5 units. Patient taking differently: 5-6 Units See admin instructions. Inject 6 units into the skin in the morning, 5 units in the afternoon, and 5 units in the evening 02/19/21   Kassie Mallick, MD  insulin  degludec (TRESIBA  FLEXTOUCH) 100 UNIT/ML FlexTouch Pen Inject 5 Units into the skin daily. 01/16/24   Gomes, Adriana, DO  levothyroxine  (SYNTHROID ) 75 MCG tablet Take 75 mcg by mouth daily. 11/06/23   [provider]  memantine  (NAMENDA ) 5 MG tablet Take 5 mg by mouth 2 (two) times daily. Patient not taking: Reported on 06/28/2021    [provider]  Multiple Vitamins-Minerals (MULTIVITAMIN ADULTS) TABS Take 1 tablet by mouth at bedtime.    [provider]  MYRBETRIQ  50 MG TB24 tablet Take 50 mg by mouth daily. 11/27/20   [provider]  OLANZapine (ZYPREXA) 2.5 MG tablet Take 2.5 mg by mouth at bedtime. 12/19/23   [provider]  rosuvastatin  (CRESTOR ) 5 MG tablet Take 1 tablet (5 mg total) by mouth at bedtime. 04/22/21   Patsy Lenis, MD  trimethoprim (TRIMPEX) 100 MG tablet Take 100 mg by mouth at bedtime.    [provider]  venlafaxine  XR (EFFEXOR -XR) 150 MG 24 hr capsule Take 150 mg by mouth daily with breakfast.    [provider]  vitamin B-12 (CYANOCOBALAMIN ) 1000 MCG tablet Take 1,000 mcg by mouth in the morning.    [provider]    Allergies: Codeine and Prednisone  Review of Systems  Gastrointestinal:  Negative for abdominal pain, nausea and vomiting.  All other systems reviewed and are negative.   Updated Vital Signs BP 133/78   Pulse 80   Temp (!) 97.5 F (36.4 C) (Oral)   Resp 17   Ht 5' 3 (1.6 m)   Wt 67.1 kg   SpO2 95%   BMI 26.22 kg/m   Physical Exam Vitals and nursing note reviewed.  Constitutional:      General: She is not in acute distress.    Appearance: Normal appearance. She is well-developed. She is not ill-appearing.  HENT:     Head:  Normocephalic and atraumatic.  Eyes:     Conjunctiva/sclera: Conjunctivae normal.  Cardiovascular:     Rate and Rhythm: Normal rate and regular rhythm.     Heart sounds: No murmur heard. Pulmonary:     Effort: Pulmonary effort is normal. No respiratory distress.     Breath sounds: Normal breath sounds.  Abdominal:     General: Abdomen is flat. Bowel sounds are normal. There is no distension.     Palpations: Abdomen is soft.     Tenderness: There is no abdominal tenderness. There is no guarding.  Musculoskeletal:        General: No swelling.     Cervical back: Neck supple.  Skin:    General: Skin is warm and dry.     Capillary Refill: Capillary refill takes less than 2 seconds.  Neurological:     Mental Status: She is alert.  Psychiatric:        Mood and Affect: Mood normal.     (all labs ordered are listed, but only abnormal results are displayed) Labs Reviewed  CBC - Abnormal; Notable for the following components:      Result Value   RBC 3.86 (*)    Hemoglobin 11.0 (*)    HCT 35.4 (*)    All other components within normal limits  URINALYSIS, ROUTINE W REFLEX MICROSCOPIC - Abnormal; Notable for the following components:   Color, Urine COLORLESS (*)    Specific Gravity, Urine 1.001 (*)    Glucose, UA >=500 (*)    All other components within normal limits  BLOOD GAS, VENOUS - Abnormal; Notable for the following components:   pO2, Ven <31 (*)    Bicarbonate 31.5 (*)    Acid-Base Excess 4.3 (*)    All other components within normal limits  COMPREHENSIVE METABOLIC PANEL WITH GFR - Abnormal; Notable for the following components:   Sodium 133 (*)    Chloride 97 (*)    Glucose, Bld 381 (*)    Creatinine, Ser 1.10 (*)    GFR, Estimated 51 (*)    All other components within normal limits  CBG MONITORING, ED - Abnormal; Notable for the following components:   Glucose-Capillary 377 (*)    All other components within normal limits  I-STAT CHEM 8, ED - Abnormal; Notable for  the following components:   Sodium 133 (*)    Creatinine, Ser 1.10 (*)    Glucose, Bld 391 (*)    Calcium , Ion 1.11 (*)    Hemoglobin 11.2 (*)    HCT 33.0 (*)    All other components within normal limits  CBG MONITORING, ED - Abnormal; Notable for the following components:   Glucose-Capillary 263 (*)    All other components within normal limits  CBG MONITORING, ED - Abnormal; Notable for the following components:   Glucose-Capillary 235 (*)  All other components within normal limits  CBG MONITORING, ED    EKG: EKG Interpretation Date/Time:  Sunday January 21 2024 16:18:39 EDT Ventricular Rate:  78 PR Interval:  132 QRS Duration:  70 QT Interval:  361 QTC Calculation: 412 R Axis:   41  Text Interpretation: Sinus rhythm Low voltage, extremity and precordial leads No significant change since last tracing Confirmed by Patt Alm DEL [phone]) on 01/21/2024 4:27:23 PM  Radiology: No results found.   Procedures   Medications Ordered in the ED  lactated ringers  bolus 1,000 mL (0 mLs Intravenous Stopped 01/21/24 2114)  insulin  aspart (novoLOG ) injection 2 Units (2 Units Subcutaneous Given 01/21/24 1659)    Clinical Course as of 01/21/24 2222  Sun Jan 21, 2024  1906 Tresiba  10u. Novolog  4-6u sliding scale as needed. [OZ]    Clinical Course User Index [OZ] Cecily Legrand LABOR, PA-C                                 Medical Decision Making Amount and/or Complexity of Data Reviewed Labs: ordered.  Risk Prescription drug management.   This patient presents to the ED for concern of hyperglycemia, this involves an extensive number of treatment options, and is a complaint that carries with it a high risk of complications and morbidity.  The differential diagnosis includes type 2 diabetes with hyperglycemia, DKA, HHS, medication noncompliance   Co morbidities that complicate the patient evaluation  Type 2 diabetes on insulin    Lab Tests:  I Ordered, and personally interpreted  labs.  The pertinent results include: Initial CBG at 377, CBC unremarkable with baseline anemia with hemoglobin 11.0, CMP with sodium at 133, ABG unremarkable pH at 7.35, UA without signs of infection   Cardiac Monitoring: / EKG:  The patient was maintained on a cardiac monitor.  I personally viewed and interpreted the cardiac monitored which showed an underlying rhythm of: Sinus rhythm   Consultations Obtained:  I requested consultation with none,  and discussed lab and imaging findings as well as pertinent plan - they recommend: N/A   Problem List / ED Course / Critical interventions / Medication management  Patient with past history significant for type 2 diabetes on insulin  here with concerns of hyperglycemia.  [place] concerns of hyperglycemia with glucose low at 529.  Was given 4 units of insulin  prior to arriving and does endorse symptoms of increased thirst but no reported abdominal pain, nausea, vomiting, or increased urinary frequency or urgency.  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  Initial CBG here shows glucose improved 377. Physical exam reveals unremarkable findings.  No focal abdominal tenderness.  Vitals unremarkable.  Patient mentating and answering all questions appropriately. Lab work is nonconcerning with no evidence of acidosis.  No acute signs of dehydration the patient's baseline renal dysfunction seen.  UA without obvious signs of infection.  After initiation of fluids and 2 units of insulin , CBG improved and stabilized at 235.  Suspect this is likely due to hyperglycemia from inadequate insulin  dosing from patient's long-acting insulin .  Will increase Tresiba  from what appears to be 5 to 10 units given patient's most recent A1c is 9.7. I ordered medication including fluids, insulin  for hyperglycemia. Reevaluation of the patient after these medicines showed that the patient proved I have reviewed the  patients home medicines and have made adjustments as needed   Social  Determinants of Health:  None   Test / Admission - Considered:  Admission considered but stable for outpatient follow-up.  Final diagnoses:  Type 2 diabetes mellitus with hyperglycemia, with long-term current use of insulin  [facility])    ED Discharge Orders          Ordered    insulin  degludec (TRESIBA  FLEXTOUCH) 100 UNIT/ML FlexTouch Pen  Daily        01/21/24 2045               Ayona Yniguez A, PA-C 01/21/24 2222    Patt Alm Macho, MD 01/21/24 2303

## 2024-01-21 NOTE — ED Notes (Signed)
 Asked patient to provide urine sample, said she had already gone in her brief. Nurse notified.

## 2024-01-21 NOTE — Discharge Instructions (Signed)
 You were seen in the emergency department today for concerns of an elevated blood sugar level.  Your glucose all responded well to insulin  and fluids and has not regulated.  I do suspect your current dose of your Tresiba  insulin  is too low and we have increased you from 5 units/day to 10 units/day that should be taken once daily.  You should continue using your NovoLog  insulin  on a sliding scale related to what your blood glucose level is in correlation to your meals.  For any concerns of new or worsening symptoms, return to the emergency department.

## 2024-02-08 IMAGING — MR MR HEAD W/O CM
11 series · 48 of 48 positions shown · non-contrast
Comparison: None.

CLINICAL DATA: Neuro deficit, acute, stroke suspected.  Confusion.

EXAM:
MRI HEAD WITHOUT CONTRAST
TECHNIQUE: Multiplanar, multiecho pulse sequences of the brain and surrounding
structures were obtained without intravenous contrast.

[Series 5: DWI · axial · 3.0mm · 1.36mm/px · z∈[-19,+120]mm · 8 of 96 slices shown (1 of 4)]
[im 1/96]
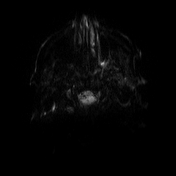
[im 14/96]
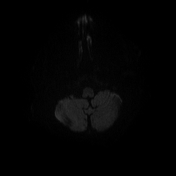
[im 28/96]
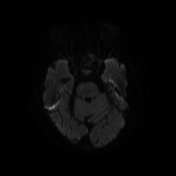
[im 41/96]
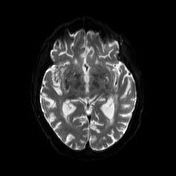
[im 55/96]
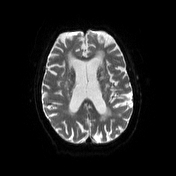
[im 68/96]
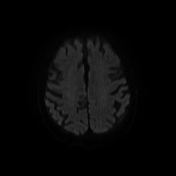
[im 82/96]
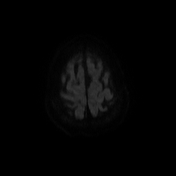
[im 96/96]
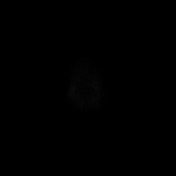

[Series 6: DWI · axial · 3.0mm · 1.36mm/px · z∈[-19,+120]mm · 4 of 48 slices shown (2 of 4)]
[im 1/48]
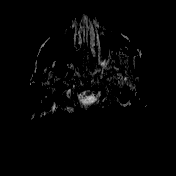
[im 16/48]
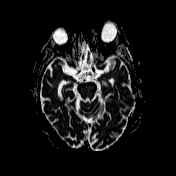
[im 32/48]
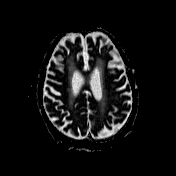
[im 48/48]
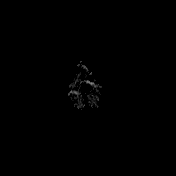

[Series 7: T1 · sagittal · 5.0mm · 0.75mm/px · 2 of 24 slices shown (1 of 3)]
[im 1/24]
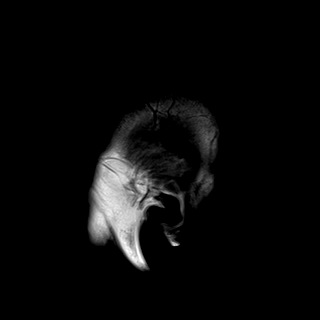
[im 24/24]
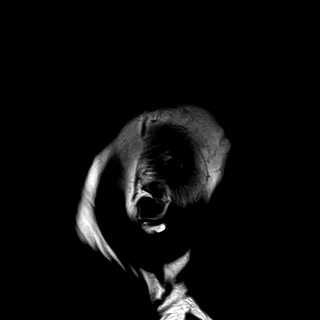

[Series 8: T2 · axial · 5.0mm · 0.62mm/px · z∈[-22,+124]mm · 2 of 24 slices shown (1 of 2)]
[im 1/24]
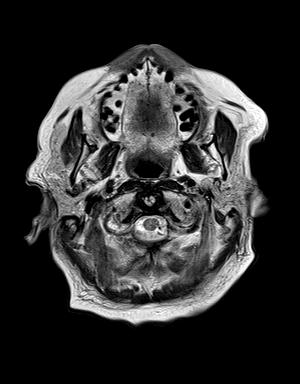
[im 24/24]
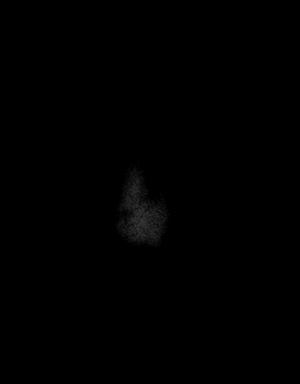

[Series 9: swi_images · axial · 3.0mm · 0.75mm/px · z∈[-24,+126]mm · 4 of 52 slices shown]
[im 1/52]
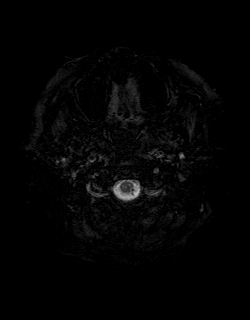
[im 18/52]
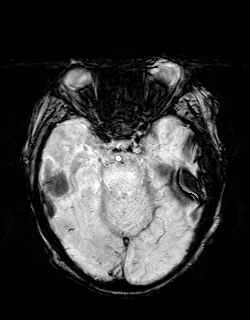
[im 35/52]
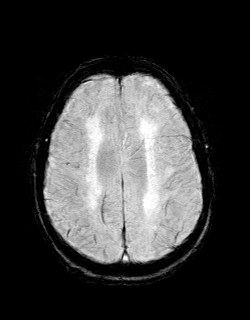
[im 52/52]
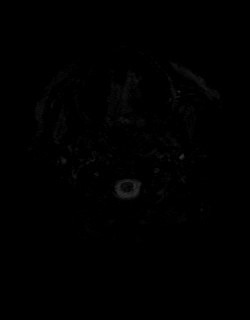

[Series 12: T1 · axial · 1.0mm · 0.94mm/px · z∈[-19,+121]mm · 12 of 144 slices shown (2 of 3)]
[im 1/144]
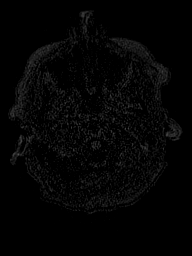
[im 14/144]
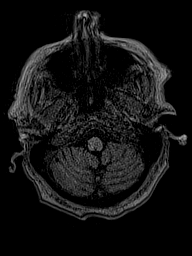
[im 27/144]
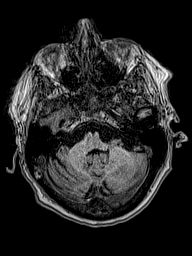
[im 40/144]
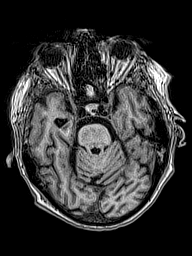
[im 53/144]
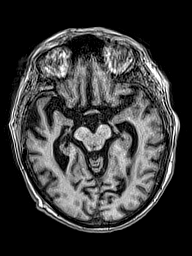
[im 66/144]
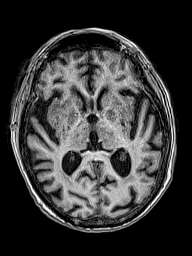
[im 79/144]
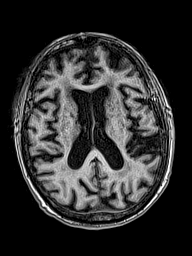
[im 92/144]
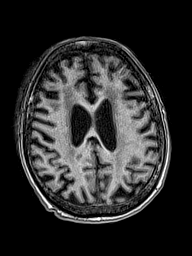
[im 105/144]
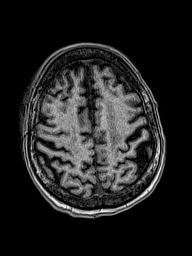
[im 118/144]
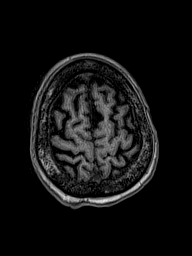
[im 131/144]
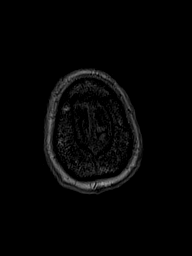
[im 144/144]
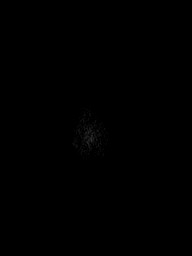

[Series 13: FLAIR · axial · 3.0mm · 0.43mm/px · z∈[-24,+123]mm · 4 of 51 slices shown]
[im 1/51]
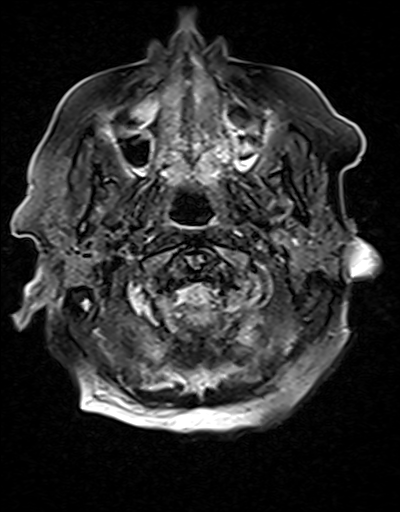
[im 17/51]
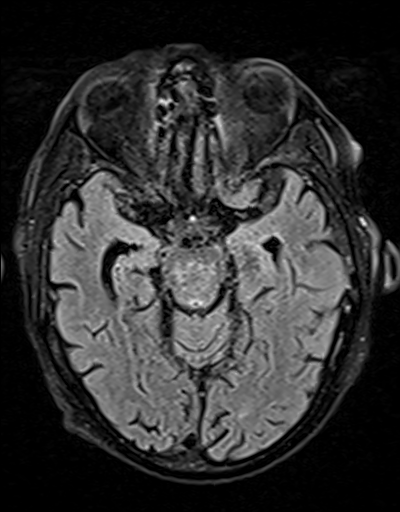
[im 34/51]
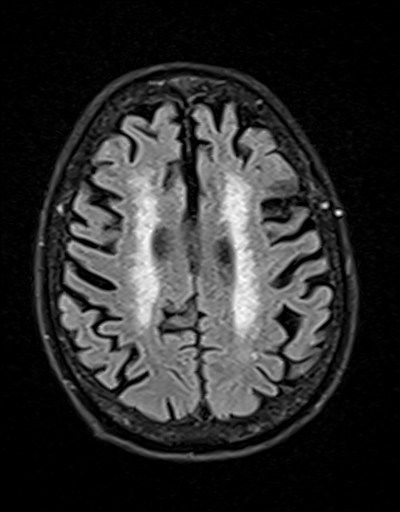
[im 51/51]
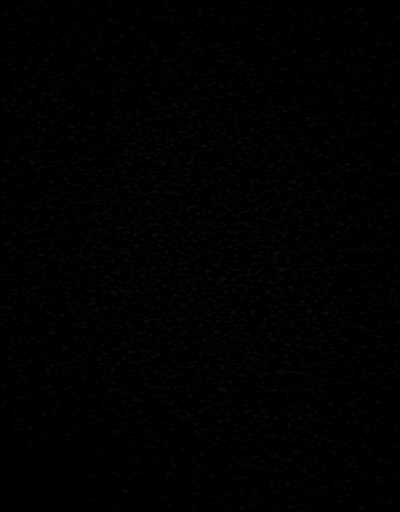

[Series 14: T1 · axial · 3.0mm · 0.45mm/px · z∈[-23,+124]mm · 4 of 51 slices shown (3 of 3)]
[im 1/51]
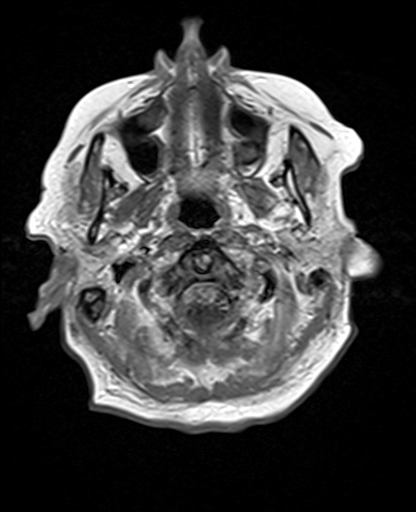
[im 17/51]
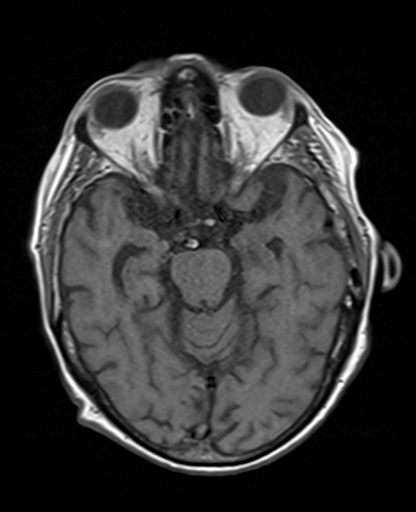
[im 34/51]
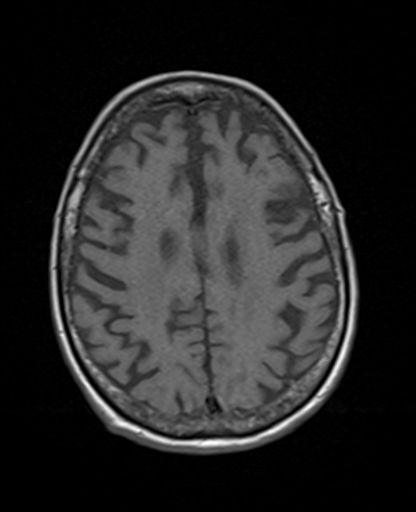
[im 51/51]
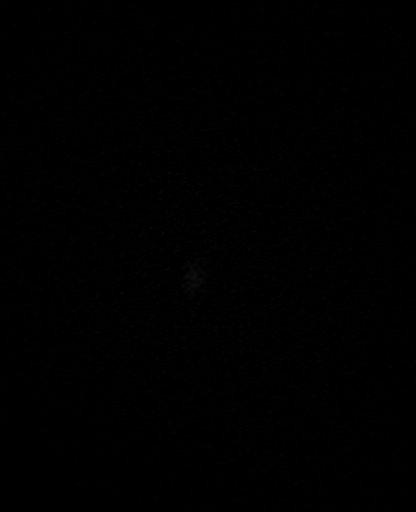

[Series 15: DWI · coronal · 5.0mm · 1.31mm/px · 4 of 48 slices shown (3 of 4)]
[im 1/48]
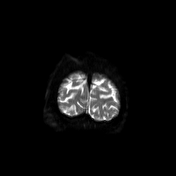
[im 16/48]
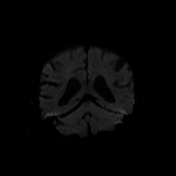
[im 32/48]
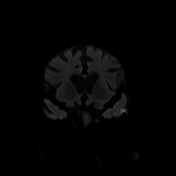
[im 48/48]
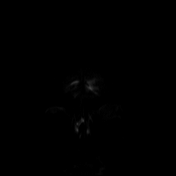

[Series 16: DWI · coronal · 5.0mm · 1.31mm/px · 2 of 24 slices shown (4 of 4)]
[im 1/24]
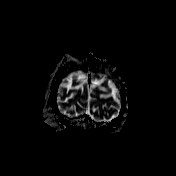
[im 24/24]
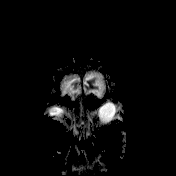

[Series 17: T2 · coronal · 5.0mm · 0.43mm/px · 2 of 24 slices shown (2 of 2)]
[im 1/24]
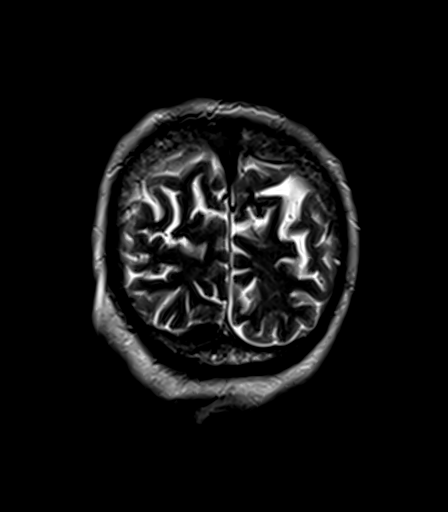
[im 24/24]
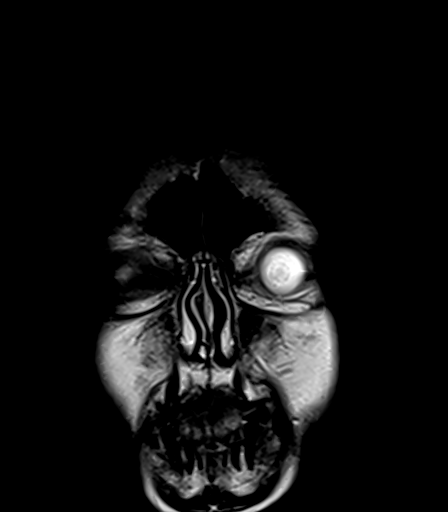

[48 of 48 positions shown; findings below may reference images not displayed]

FINDINGS: Brain: There is no evidence of an acute infarct, intracranial
hemorrhage, mass, midline shift, or extra-axial fluid collection.
Patchy T2 hyperintensities in the cerebral white matter, deep gray
nuclei, and pons are nonspecific but compatible with moderate to
severe chronic small vessel ischemic disease. Dilated perivascular
spaces are present throughout the basal ganglia. There is moderate
cerebral atrophy.

Vascular: Major intracranial vascular flow voids are preserved.

Skull and upper cervical spine: Mild nonspecific diffuse bone marrow
heterogeneity. No destructive skull lesion.

Sinuses/Orbits: Bilateral cataract extraction. Mild mucosal
thickening in the paranasal sinuses. Trace right mastoid fluid.

Other: None.
IMPRESSION: 1. No acute intracranial abnormality.
2. Moderate to severe chronic small vessel ischemic disease and
moderate cerebral atrophy.

## 2024-04-01 ENCOUNTER — Emergency Department (HOSPITAL_COMMUNITY)

## 2024-04-01 ENCOUNTER — Other Ambulatory Visit: Payer: Self-pay

## 2024-04-01 ENCOUNTER — Emergency Department (HOSPITAL_COMMUNITY)
Admission: EM | Admit: 2024-04-01 | Discharge: 2024-04-02 | Disposition: A | Discharge status: Home / Self Care | Attending: Emergency Medicine | Admitting: Emergency Medicine

## 2024-04-01 DIAGNOSIS — M545 Low back pain, unspecified: Secondary | ICD-10-CM | POA: Diagnosis not present

## 2024-04-01 DIAGNOSIS — F0390 Unspecified dementia without behavioral disturbance: Secondary | ICD-10-CM | POA: Diagnosis not present

## 2024-04-01 DIAGNOSIS — N3000 Acute cystitis without hematuria: Secondary | ICD-10-CM | POA: Diagnosis not present

## 2024-04-01 DIAGNOSIS — Z7984 Long term (current) use of oral hypoglycemic drugs: Secondary | ICD-10-CM | POA: Diagnosis not present

## 2024-04-01 DIAGNOSIS — R109 Unspecified abdominal pain: Secondary | ICD-10-CM | POA: Diagnosis present

## 2024-04-01 DIAGNOSIS — E119 Type 2 diabetes mellitus without complications: Secondary | ICD-10-CM | POA: Diagnosis not present

## 2024-04-01 DIAGNOSIS — M546 Pain in thoracic spine: Secondary | ICD-10-CM | POA: Diagnosis not present

## 2024-04-01 DIAGNOSIS — Z794 Long term (current) use of insulin: Secondary | ICD-10-CM | POA: Diagnosis not present

## 2024-04-01 DIAGNOSIS — Z7982 Long term (current) use of aspirin: Secondary | ICD-10-CM | POA: Diagnosis not present

## 2024-04-01 DIAGNOSIS — M542 Cervicalgia: Secondary | ICD-10-CM | POA: Diagnosis not present

## 2024-04-01 LAB — I-STAT CHEM 8, ED
BUN: 28 mg/dL — ABNORMAL HIGH (ref 8–23)
Calcium, Ion: 1.17 mmol/L (ref 1.15–1.40)
Chloride: 103 mmol/L (ref 98–111)
Creatinine, Ser: 1.2 mg/dL — ABNORMAL HIGH (ref 0.44–1.00)
Glucose, Bld: 240 mg/dL — ABNORMAL HIGH (ref 70–99)
HCT: 41 % (ref 36.0–46.0)
Hemoglobin: 13.9 g/dL (ref 12.0–15.0)
Potassium: 5.5 mmol/L — ABNORMAL HIGH (ref 3.5–5.1)
Sodium: 140 mmol/L (ref 135–145)
TCO2: 28 mmol/L (ref 22–32)

## 2024-04-01 LAB — CBC WITH DIFFERENTIAL/PLATELET
Abs Immature Granulocytes: 0.03 K/uL (ref 0.00–0.07)
Basophils Absolute: 0 K/uL (ref 0.0–0.1)
Basophils Relative: 1 %
Eosinophils Absolute: 0.1 K/uL (ref 0.0–0.5)
Eosinophils Relative: 1 %
HCT: 42.2 % (ref 36.0–46.0)
Hemoglobin: 13.7 g/dL (ref 12.0–15.0)
Immature Granulocytes: 0 %
Lymphocytes Relative: 18 %
Lymphs Abs: 1.4 K/uL (ref 0.7–4.0)
MCH: 28.7 pg (ref 26.0–34.0)
MCHC: 32.5 g/dL (ref 30.0–36.0)
MCV: 88.5 fL (ref 80.0–100.0)
Monocytes Absolute: 0.5 K/uL (ref 0.1–1.0)
Monocytes Relative: 7 %
Neutro Abs: 5.8 K/uL (ref 1.7–7.7)
Neutrophils Relative %: 73 %
Platelets: 341 K/uL (ref 150–400)
RBC: 4.77 MIL/uL (ref 3.87–5.11)
RDW: 12.4 % (ref 11.5–15.5)
WBC: 7.9 K/uL (ref 4.0–10.5)
nRBC: 0 % (ref 0.0–0.2)

## 2024-04-01 LAB — COMPREHENSIVE METABOLIC PANEL WITH GFR
ALT: 18 U/L (ref 0–44)
AST: 41 U/L (ref 15–41)
Albumin: 4.1 g/dL (ref 3.5–5.0)
Alkaline Phosphatase: 145 U/L — ABNORMAL HIGH (ref 38–126)
Anion gap: 11 (ref 5–15)
BUN: 20 mg/dL (ref 8–23)
CO2: 26 mmol/L (ref 22–32)
Calcium: 9.9 mg/dL (ref 8.9–10.3)
Chloride: 103 mmol/L (ref 98–111)
Creatinine, Ser: 1.18 mg/dL — ABNORMAL HIGH (ref 0.44–1.00)
GFR, Estimated: 47 mL/min — ABNORMAL LOW (ref >60)
Glucose, Bld: 249 mg/dL — ABNORMAL HIGH (ref 70–99)
Potassium: 5.2 mmol/L — ABNORMAL HIGH (ref 3.5–5.1)
Sodium: 141 mmol/L (ref 135–145)
Total Bilirubin: 0.4 mg/dL (ref 0.0–1.2)
Total Protein: 8.1 g/dL (ref 6.5–8.1)

## 2024-04-01 LAB — LIPASE, BLOOD: Lipase: 17 U/L (ref 11–51)

## 2024-04-01 LAB — TROPONIN T, HIGH SENSITIVITY: Troponin T High Sensitivity: 16 ng/L (ref 0–19)

## 2024-04-01 LAB — CK: Total CK: 316 U/L — ABNORMAL HIGH (ref 38–234)

## 2024-04-01 MED ORDER — LACTATED RINGERS IV BOLUS
500.0000 mL | Freq: Once | INTRAVENOUS | Status: AC
  Administered 2024-04-01: 20:00:00 500 mL via INTRAVENOUS

## 2024-04-01 MED ORDER — IOHEXOL 300 MG/ML  SOLN
80.0000 mL | Freq: Once | INTRAMUSCULAR | Status: AC | PRN
  Administered 2024-04-01: 21:00:00 80 mL via INTRAVENOUS

## 2024-04-01 NOTE — ED Provider Notes (Signed)
 [place] EMERGENCY DEPARTMENT AT [facility] Provider Note   CSN: [id number] Arrival date & time: 04/01/24  1300     Patient presents with: Abdominal Pain and Neck Pain   Wanda Jones is a 78 y.o. female.    Abdominal Pain Neck Pain    Patient has history of recurrent UTIs diabetes hyperlipidemia dementia sepsis cellulitis.  Patient has been having trouble with increasing weakness over the last couple of days.  She has had more difficulty walking and standing.  She reports pain in her abdomen that goes down her legs.  Patient also reported earlier some pain in her neck and shoulder.  Her son reports he also noticed that her legs looked purple this morning.  They do not appear that way now.  No recent falls or injuries.  Patient states she has had some increased discomfort after trying to pull up in the bed  Prior to Admission medications  Medication Sig Start Date End Date Taking? Authorizing Provider  acetaminophen  (TYLENOL ) 325 MG tablet Take 2 tablets (650 mg total) by mouth every 6 (six) hours as needed for mild pain (or Fever >/= 101). 06/30/21   Rizwan, Saima, MD  aspirin  EC 81 MG tablet Take 81 mg by mouth in the morning. Swallow whole.    [provider]  clobetasol (TEMOVATE) 0.05 % external solution Apply 1 application. topically 2 (two) times daily as needed (for scalp irritation).    [provider]  donepezil  (ARICEPT ) 5 MG tablet Take 5 mg by mouth at bedtime.    [provider]  insulin  aspart (NOVOLOG  FLEXPEN) 100 UNIT/ML FlexPen 3 times a day (just before each meal), 6-5-5 units. Patient taking differently: 5-6 Units See admin instructions. Inject 6 units into the skin in the morning, 5 units in the afternoon, and 5 units in the evening 02/19/21   Kassie Mallick, MD  insulin  degludec (TRESIBA  FLEXTOUCH) 100 UNIT/ML FlexTouch Pen Inject 5 Units into the skin daily. 01/16/24   Gomes, Adriana, DO  levothyroxine  (SYNTHROID ) 75 MCG tablet  Take 75 mcg by mouth daily. 11/06/23   [provider]  memantine  (NAMENDA ) 5 MG tablet Take 5 mg by mouth 2 (two) times daily. Patient not taking: Reported on 06/28/2021    [provider]  Multiple Vitamins-Minerals (MULTIVITAMIN ADULTS) TABS Take 1 tablet by mouth at bedtime.    [provider]  MYRBETRIQ  50 MG TB24 tablet Take 50 mg by mouth daily. 11/27/20   [provider]  OLANZapine (ZYPREXA) 2.5 MG tablet Take 2.5 mg by mouth at bedtime. 12/19/23   [provider]  rosuvastatin  (CRESTOR ) 5 MG tablet Take 1 tablet (5 mg total) by mouth at bedtime. 04/22/21   Patsy Lenis, MD  [Paused] trimethoprim (TRIMPEX) 100 MG tablet Take 100 mg by mouth at bedtime. Wait to take this until your doctor or other care provider tells you to start again.    [provider]  venlafaxine  XR (EFFEXOR -XR) 150 MG 24 hr capsule Take 150 mg by mouth daily with breakfast.    [provider]  vitamin B-12 (CYANOCOBALAMIN ) 1000 MCG tablet Take 1,000 mcg by mouth in the morning.    [provider]    Allergies: Codeine and Prednisone    Review of Systems  Gastrointestinal:  Positive for abdominal pain.  Musculoskeletal:  Positive for neck pain.    Updated Vital Signs BP (!) 176/84   Pulse (!) 104   Temp 98 F (36.7 C)   Resp  18   SpO2 93%   Physical Exam Vitals and nursing note reviewed.  Constitutional:      General: She is not in acute distress.    Appearance: She is well-developed.  HENT:     Head: Normocephalic and atraumatic.     Right Ear: External ear normal.     Left Ear: External ear normal.  Eyes:     General: No scleral icterus.       Right eye: No discharge.        Left eye: No discharge.     Conjunctiva/sclera: Conjunctivae normal.  Neck:     Trachea: No tracheal deviation.  Cardiovascular:     Rate and Rhythm: Normal rate and regular rhythm.  Pulmonary:     Effort: Pulmonary effort is normal. No respiratory  distress.     Breath sounds: Normal breath sounds. No stridor. No wheezing or rales.  Abdominal:     General: Bowel sounds are normal. There is no distension.     Palpations: Abdomen is soft.     Tenderness: There is abdominal tenderness in the right lower quadrant, suprapubic area and left lower quadrant. There is no guarding or rebound.  Musculoskeletal:        General: No deformity.     Cervical back: Neck supple. Tenderness present.     Thoracic back: Tenderness present.     Lumbar back: Tenderness present.     Comments: Extremities are warm and well-perfused, no cyanosis, pulses palpable  Skin:    General: Skin is warm and dry.     Findings: No rash.  Neurological:     General: No focal deficit present.     Mental Status: She is alert.     Cranial Nerves: No cranial nerve deficit, dysarthria or facial asymmetry.     Sensory: No sensory deficit.     Motor: No abnormal muscle tone or seizure activity.     Coordination: Coordination normal.  Psychiatric:        Mood and Affect: Mood normal.     (all labs ordered are listed, but only abnormal results are displayed) Labs Reviewed  COMPREHENSIVE METABOLIC PANEL WITH GFR - Abnormal; Notable for the following components:      Result Value   Potassium 5.2 (*)    Glucose, Bld 249 (*)    Creatinine, Ser 1.18 (*)    Alkaline Phosphatase 145 (*)    GFR, Estimated 47 (*)    All other components within normal limits  CK - Abnormal; Notable for the following components:   Total CK 316 (*)    All other components within normal limits  I-STAT CHEM 8, ED - Abnormal; Notable for the following components:   Potassium 5.5 (*)    BUN 28 (*)    Creatinine, Ser 1.20 (*)    Glucose, Bld 240 (*)    All other components within normal limits  CBC WITH DIFFERENTIAL/PLATELET  LIPASE, BLOOD  URINALYSIS, ROUTINE W REFLEX MICROSCOPIC  CBG MONITORING, ED  TROPONIN T, HIGH SENSITIVITY    EKG: None  Radiology: CT ABDOMEN PELVIS W  CONTRAST Result Date: 04/01/2024 EXAM: CT ABDOMEN AND PELVIS WITH CONTRAST 04/01/2024 09:29:00 PM TECHNIQUE: CT of the abdomen and pelvis was performed with the administration of 80 mL of iohexol  (OMNIPAQUE ) 300 MG/ML solution. Multiplanar reformatted images are provided for review. Automated exposure control, iterative reconstruction, and/or weight-based adjustment of the mA/kV was utilized to reduce the radiation dose to as low as reasonably achievable. COMPARISON: None  available. CLINICAL HISTORY: Acute abdominal pain. FINDINGS: LOWER CHEST: Lung bases demonstrate compressive atelectasis in the left lower lobe medially, likely related to the known hiatal hernia. LIVER: The liver is unremarkable. GALLBLADDER AND BILE DUCTS: Gallbladder is unremarkable. No biliary ductal dilatation. SPLEEN: No acute abnormality. PANCREAS: No acute abnormality. ADRENAL GLANDS: No acute abnormality. KIDNEYS, URETERS AND BLADDER: A small nonobstructing left renal stone is noted. The bladder is well distended. No hydronephrosis. No perinephric or periureteral stranding. GI AND BOWEL: Stomach demonstrates a hiatal hernia. The small bowel is unremarkable. No obstructive changes of the colon are seen. Scattered diverticular changes noted without diverticulitis. The appendix is within normal limits. PERITONEUM AND RETROPERITONEUM: No ascites. No free air. VASCULATURE: Aortic calcifications are seen without an aneurysmal dilatation. LYMPH NODES: No lymphadenopathy. REPRODUCTIVE ORGANS: The uterus is within normal limits. BONES AND SOFT TISSUES: A right hip replacement is seen. Changes of prior vertebral augmentation are noted as well. No acute osseous abnormality. No focal soft tissue abnormality. IMPRESSION: 1. No acute findings in the abdomen or pelvis. 2. Chronic changes as described above Electronically signed by: Oneil Devonshire MD 04/01/2024 09:49 PM EST RP Workstation: [id number]   DG Chest Portable 1 View Result Date:  04/01/2024 EXAM: 1 VIEW(S) XRAY OF THE CHEST 04/01/2024 05:59:00 PM COMPARISON: 01/14/2024 CLINICAL HISTORY: weakness FINDINGS: LUNGS AND PLEURA: Low lung volumes with crowding of bronchovascular structures. No focal pulmonary opacity. No pleural effusion. No pneumothorax. HEART AND MEDIASTINUM: Large hiatal hernia. Aortic atherosclerosis. No acute abnormality of the cardiac silhouette. BONES AND SOFT TISSUES: Surgical clips in right axilla. Old bilateral clavicular fractures. IMPRESSION: 1. No acute cardiopulmonary process. 2. Large hiatal hernia. Electronically signed by: Greig Pique MD 04/01/2024 06:54 PM EST RP Workstation: [id number]     Procedures   Medications Ordered in the ED  lactated ringers  bolus 500 mL (0 mLs Intravenous Stopped 04/01/24 2254)  iohexol  (OMNIPAQUE ) 300 MG/ML solution 80 mL (80 mLs Intravenous Contrast Given 04/01/24 2118)    Clinical Course as of 04/01/24 2351  Mon Apr 01, 2024  2018 CBC with Differential Normal [JK]  2018 I-stat chem 8, ED (not at [facility], [facility] or [facility])(!) [JK]  2029 Comprehensive metabolic panel(!) Metabolic panel shows slightly increased potassium but there is hemolysis.  Glucose slightly elevated [JK]  2203 Troponin T, High Sensitivity nl [JK]  2204 CK(!) Increased  [JK]  2204 CT ABDOMEN PELVIS W CONTRAST Ct scan without acute findings [JK]  2204 DG Chest Portable 1 View Cxr with hiatal hernia. No acute process  [JK]    Clinical Course User Index [JK] Randol Simmonds, MD                                 Medical Decision Making Amount and/or Complexity of Data Reviewed Labs: ordered. Decision-making details documented in ED Course. Radiology: ordered. Decision-making details documented in ED Course.  Risk Prescription drug management.   Pt reported pain in her lower abdomen.  Also having some generalized maliase, tenderness.  No fevers.  No vomiting or diarrhea. Patient without any focal neurologic deficits on exam.  Able to lift  both her legs off the bed.  She has good grip strength.  Patient's laboratory tests do not show any signs of leukocytosis.  No significant metabolic abnormalities.  CK slightly elevated but no signs of severe rhabdomyolysis.  CT scan does not show any acute abnormalities.  Etiology of sx unclear but no signs of serious  abnormality at this time.  UA is currently pending.   Case  turned over to Dr Gennaro at shift change.        Final diagnoses:  None    ED Discharge Orders     None          Randol Simmonds, MD 04/01/24 2351

## 2024-04-01 NOTE — ED Provider Triage Note (Signed)
 Emergency Medicine Provider Triage Evaluation Note  Wanda Jones , a 78 y.o. female  was evaluated in triage.  Pt complains of weakness.  Pt reports pain in her neck,shoulders and legs,  Pt reports pain began after trying to pull up in bed  Review of Systems  Positive: Neck pain Negative: fever  Physical Exam  BP (!) 148/73 (BP Location: Left Arm)   Pulse (!) 106   Temp 98.4 F (36.9 C) (Oral)   Resp 17   SpO2 97%  Gen:   Awake, no distress   Resp:  Normal effort  MSK:   Moves extremities without difficulty  Other:    Medical Decision Making  Medically screening exam initiated at 1:37 PM.  Appropriate orders placed.  Wanda Jones was informed that the remainder of the evaluation will be completed by another provider, this initial triage assessment does not replace that evaluation, and the importance of remaining in the ED until their evaluation is complete.     Wanda Jones [place], [place] 04/01/24 1339

## 2024-04-01 NOTE — ED Notes (Signed)
 Labs and LR delayed due to IV team consult.

## 2024-04-01 NOTE — ED Triage Notes (Signed)
 Pt BIBA from assisted living for abd pain and neck pain. If she tries to sit up, abd pain down to legs and sitting up hurts neck. Thinks this happened when she was trying to pull herself into bed by the rails.

## 2024-04-02 LAB — URINALYSIS, ROUTINE W REFLEX MICROSCOPIC
Bilirubin Urine: NEGATIVE
Glucose, UA: 150 mg/dL — AB
Hgb urine dipstick: NEGATIVE
Ketones, ur: 5 mg/dL — AB
Nitrite: NEGATIVE
Protein, ur: NEGATIVE mg/dL
Specific Gravity, Urine: 1.039 — ABNORMAL HIGH (ref 1.005–1.030)
WBC, UA: >50 WBC/hpf (ref 0–5)
pH: 5 (ref 5.0–8.0)

## 2024-04-02 MED ORDER — CEPHALEXIN 500 MG PO CAPS
500.0000 mg | ORAL_CAPSULE | Freq: Once | ORAL | Status: AC
  Administered 2024-04-02: 02:00:00 500 mg via ORAL
  Filled 2024-04-02: qty 1

## 2024-04-02 MED ORDER — CEPHALEXIN 500 MG PO CAPS: 500.0000 mg | ORAL_CAPSULE | Freq: Four times a day (QID) | ORAL | 0 refills | Status: AC

## 2024-04-02 NOTE — Discharge Instructions (Signed)
 Take your antibiotics as prescribed.  Follow-up with your primary care doctor next week.  Return to the ER for new or worsening symptoms.

## 2024-04-02 NOTE — ED Provider Notes (Signed)
 I took over care of this patient at 29 AM pending UA.  My evaluation she is sleeping but easy to wake up.  Son at bedside. Physical Exam  BP 131/71   Pulse 100   Temp 98 F (36.7 C)   Resp 18   SpO2 95%     ED Course / MDM   Clinical Course as of 04/02/24 0130  Mon Apr 01, 2024  2018 CBC with Differential Normal [JK]  2018 I-stat chem 8, ED (not at [facility], [facility] or [facility])(!) [JK]  2029 Comprehensive metabolic panel(!) Metabolic panel shows slightly increased potassium but there is hemolysis.  Glucose slightly elevated [JK]  2203 Troponin T, High Sensitivity nl [JK]  2204 CK(!) Increased  [JK]  2204 CT ABDOMEN PELVIS W CONTRAST Ct scan without acute findings [JK]  2204 DG Chest Portable 1 View Cxr with hiatal hernia. No acute process  [JK]    Clinical Course User Index [JK] Randol Simmonds, MD   Medical Decision Making Patient's UA shows mild urinary tract infection.  Vitals are stable.  Will start her on Keflex  and give her first dose here.  Discussed all results and plan with patient and son at bedside.  Advise close up with primary care and otherwise return to the ER for new or worsening symptoms.  Son feels comfortable to plan for the patient to be discharged home.  Problems Addressed: Acute cystitis without hematuria: acute illness or injury  Amount and/or Complexity of Data Reviewed Labs: ordered. Decision-making details documented in ED Course. Radiology: ordered. Decision-making details documented in ED Course.  Risk OTC drugs. Prescription drug management. Diagnosis or treatment significantly limited by social determinants of health.          Gennaro Bouchard L, DO 04/02/24 0130
